# Patient Record
Sex: Female | Born: 1935 | Race: White | Hispanic: No | Marital: Married | Smoking: Never smoker
Health system: Southern US, Community
[De-identification: ages and names within clinical notes are randomized; demographics above are authoritative.]

## PROBLEM LIST (undated history)

## (undated) DIAGNOSIS — K5792 Diverticulitis of intestine, part unspecified, without perforation or abscess without bleeding: Secondary | ICD-10-CM

## (undated) DIAGNOSIS — R252 Cramp and spasm: Secondary | ICD-10-CM

## (undated) DIAGNOSIS — M1990 Unspecified osteoarthritis, unspecified site: Secondary | ICD-10-CM

## (undated) DIAGNOSIS — I6509 Occlusion and stenosis of unspecified vertebral artery: Secondary | ICD-10-CM

## (undated) DIAGNOSIS — E785 Hyperlipidemia, unspecified: Secondary | ICD-10-CM

## (undated) DIAGNOSIS — I10 Essential (primary) hypertension: Secondary | ICD-10-CM

## (undated) DIAGNOSIS — E119 Type 2 diabetes mellitus without complications: Secondary | ICD-10-CM

## (undated) DIAGNOSIS — G4730 Sleep apnea, unspecified: Secondary | ICD-10-CM

## (undated) DIAGNOSIS — K219 Gastro-esophageal reflux disease without esophagitis: Secondary | ICD-10-CM

## (undated) DIAGNOSIS — N3941 Urge incontinence: Secondary | ICD-10-CM

## (undated) HISTORY — DX: Hyperlipidemia, unspecified: E78.5

## (undated) HISTORY — PX: TONSILLECTOMY AND ADENOIDECTOMY: SUR1326

## (undated) HISTORY — PX: TOTAL ABDOMINAL HYSTERECTOMY W/ BILATERAL SALPINGOOPHORECTOMY: SHX83

## (undated) HISTORY — PX: APPENDECTOMY: SHX54

## (undated) HISTORY — PX: CATARACT EXTRACTION W/ INTRAOCULAR LENS  IMPLANT, BILATERAL: SHX1307

## (undated) HISTORY — DX: Cramp and spasm: R25.2

## (undated) HISTORY — DX: Essential (primary) hypertension: I10

---

## 1997-10-15 HISTORY — PX: OTHER SURGICAL HISTORY: SHX169

## 1997-11-26 ENCOUNTER — Ambulatory Visit (HOSPITAL_COMMUNITY): Admission: RE | Admit: 1997-11-26 | Discharge: 1997-11-26 | Payer: Self-pay | Admitting: Internal Medicine

## 1998-12-20 ENCOUNTER — Encounter: Payer: Self-pay | Admitting: Internal Medicine

## 1998-12-20 ENCOUNTER — Ambulatory Visit (HOSPITAL_COMMUNITY): Admission: RE | Admit: 1998-12-20 | Discharge: 1998-12-20 | Payer: Self-pay | Admitting: Internal Medicine

## 1999-02-07 ENCOUNTER — Ambulatory Visit (HOSPITAL_BASED_OUTPATIENT_CLINIC_OR_DEPARTMENT_OTHER): Admission: RE | Admit: 1999-02-07 | Discharge: 1999-02-07 | Payer: Self-pay | Admitting: Orthopedic Surgery

## 2000-01-16 ENCOUNTER — Ambulatory Visit (HOSPITAL_COMMUNITY): Admission: RE | Admit: 2000-01-16 | Discharge: 2000-01-16 | Payer: Self-pay | Admitting: Internal Medicine

## 2000-01-16 ENCOUNTER — Encounter: Payer: Self-pay | Admitting: Internal Medicine

## 2000-01-17 ENCOUNTER — Encounter: Admission: RE | Admit: 2000-01-17 | Discharge: 2000-01-17 | Payer: Self-pay | Admitting: Internal Medicine

## 2000-01-17 ENCOUNTER — Encounter: Payer: Self-pay | Admitting: Internal Medicine

## 2000-04-23 ENCOUNTER — Encounter: Payer: Self-pay | Admitting: Internal Medicine

## 2000-04-23 ENCOUNTER — Encounter: Admission: RE | Admit: 2000-04-23 | Discharge: 2000-04-23 | Payer: Self-pay | Admitting: Internal Medicine

## 2000-06-14 ENCOUNTER — Encounter: Admission: RE | Admit: 2000-06-14 | Discharge: 2000-06-14 | Payer: Self-pay | Admitting: *Deleted

## 2000-06-14 ENCOUNTER — Encounter: Payer: Self-pay | Admitting: Internal Medicine

## 2001-02-13 ENCOUNTER — Ambulatory Visit (HOSPITAL_COMMUNITY): Admission: RE | Admit: 2001-02-13 | Discharge: 2001-02-13 | Payer: Self-pay | Admitting: Otolaryngology

## 2001-02-13 ENCOUNTER — Encounter: Payer: Self-pay | Admitting: Otolaryngology

## 2001-03-03 ENCOUNTER — Ambulatory Visit (HOSPITAL_COMMUNITY): Admission: RE | Admit: 2001-03-03 | Discharge: 2001-03-03 | Payer: Self-pay | Admitting: Internal Medicine

## 2001-03-03 ENCOUNTER — Encounter: Payer: Self-pay | Admitting: Internal Medicine

## 2001-03-18 ENCOUNTER — Ambulatory Visit (HOSPITAL_COMMUNITY): Admission: RE | Admit: 2001-03-18 | Discharge: 2001-03-18 | Payer: Self-pay | Admitting: Internal Medicine

## 2001-03-18 ENCOUNTER — Encounter: Payer: Self-pay | Admitting: Internal Medicine

## 2001-04-01 ENCOUNTER — Ambulatory Visit (HOSPITAL_COMMUNITY): Admission: RE | Admit: 2001-04-01 | Discharge: 2001-04-01 | Payer: Self-pay | Admitting: Interventional Radiology

## 2001-07-28 ENCOUNTER — Encounter: Admission: RE | Admit: 2001-07-28 | Discharge: 2001-07-28 | Payer: Self-pay | Admitting: Internal Medicine

## 2001-07-28 ENCOUNTER — Encounter: Payer: Self-pay | Admitting: Internal Medicine

## 2001-08-15 ENCOUNTER — Ambulatory Visit (HOSPITAL_COMMUNITY): Admission: RE | Admit: 2001-08-15 | Discharge: 2001-08-15 | Payer: Self-pay | Admitting: Family Medicine

## 2001-10-01 ENCOUNTER — Encounter: Payer: Self-pay | Admitting: Internal Medicine

## 2001-10-01 ENCOUNTER — Ambulatory Visit (HOSPITAL_COMMUNITY): Admission: RE | Admit: 2001-10-01 | Discharge: 2001-10-01 | Payer: Self-pay | Admitting: Internal Medicine

## 2002-03-25 ENCOUNTER — Ambulatory Visit (HOSPITAL_COMMUNITY): Admission: RE | Admit: 2002-03-25 | Discharge: 2002-03-25 | Payer: Self-pay | Admitting: Internal Medicine

## 2002-03-25 ENCOUNTER — Encounter: Payer: Self-pay | Admitting: Internal Medicine

## 2002-10-26 ENCOUNTER — Ambulatory Visit (HOSPITAL_COMMUNITY): Admission: RE | Admit: 2002-10-26 | Discharge: 2002-10-26 | Payer: Self-pay | Admitting: Interventional Radiology

## 2003-01-28 ENCOUNTER — Ambulatory Visit (HOSPITAL_COMMUNITY): Admission: RE | Admit: 2003-01-28 | Discharge: 2003-01-28 | Payer: Self-pay | Admitting: Gastroenterology

## 2003-04-23 ENCOUNTER — Encounter: Payer: Self-pay | Admitting: Internal Medicine

## 2003-04-23 ENCOUNTER — Ambulatory Visit (HOSPITAL_COMMUNITY): Admission: RE | Admit: 2003-04-23 | Discharge: 2003-04-23 | Payer: Self-pay | Admitting: Internal Medicine

## 2004-01-11 ENCOUNTER — Ambulatory Visit (HOSPITAL_COMMUNITY): Admission: RE | Admit: 2004-01-11 | Discharge: 2004-01-11 | Payer: Self-pay | Admitting: Interventional Radiology

## 2004-06-12 ENCOUNTER — Ambulatory Visit (HOSPITAL_COMMUNITY): Admission: RE | Admit: 2004-06-12 | Discharge: 2004-06-12 | Payer: Self-pay | Admitting: Internal Medicine

## 2005-03-01 ENCOUNTER — Ambulatory Visit (HOSPITAL_COMMUNITY): Admission: RE | Admit: 2005-03-01 | Discharge: 2005-03-01 | Payer: Self-pay | Admitting: Interventional Radiology

## 2005-07-09 ENCOUNTER — Ambulatory Visit (HOSPITAL_COMMUNITY): Admission: RE | Admit: 2005-07-09 | Discharge: 2005-07-09 | Payer: Self-pay | Admitting: Internal Medicine

## 2006-05-21 ENCOUNTER — Encounter: Admission: RE | Admit: 2006-05-21 | Discharge: 2006-05-21 | Payer: Self-pay | Admitting: Internal Medicine

## 2006-07-09 ENCOUNTER — Encounter: Admission: RE | Admit: 2006-07-09 | Discharge: 2006-07-09 | Payer: Self-pay | Admitting: Internal Medicine

## 2006-10-01 ENCOUNTER — Encounter: Admission: RE | Admit: 2006-10-01 | Discharge: 2006-10-01 | Payer: Self-pay | Admitting: Internal Medicine

## 2007-06-18 ENCOUNTER — Ambulatory Visit: Payer: Self-pay | Admitting: Vascular Surgery

## 2007-07-17 ENCOUNTER — Ambulatory Visit: Payer: Self-pay | Admitting: Vascular Surgery

## 2007-08-26 ENCOUNTER — Ambulatory Visit (HOSPITAL_BASED_OUTPATIENT_CLINIC_OR_DEPARTMENT_OTHER): Admission: RE | Admit: 2007-08-26 | Discharge: 2007-08-26 | Payer: Self-pay | Admitting: Urology

## 2007-08-26 HISTORY — PX: PUBOVAGINAL SLING: SHX1035

## 2007-09-19 ENCOUNTER — Encounter: Admission: RE | Admit: 2007-09-19 | Discharge: 2007-09-19 | Payer: Self-pay | Admitting: Internal Medicine

## 2007-11-13 ENCOUNTER — Ambulatory Visit: Payer: Self-pay | Admitting: Vascular Surgery

## 2008-09-10 ENCOUNTER — Emergency Department (HOSPITAL_COMMUNITY): Admission: EM | Admit: 2008-09-10 | Discharge: 2008-09-11 | Payer: Self-pay | Admitting: Emergency Medicine

## 2008-10-19 ENCOUNTER — Encounter: Admission: RE | Admit: 2008-10-19 | Discharge: 2008-10-19 | Payer: Self-pay | Admitting: Internal Medicine

## 2008-12-16 ENCOUNTER — Inpatient Hospital Stay (HOSPITAL_COMMUNITY): Admission: RE | Admit: 2008-12-16 | Discharge: 2008-12-19 | Payer: Self-pay | Admitting: Orthopaedic Surgery

## 2008-12-16 HISTORY — PX: TOTAL HIP ARTHROPLASTY: SHX124

## 2009-10-25 ENCOUNTER — Encounter: Admission: RE | Admit: 2009-10-25 | Discharge: 2009-10-25 | Payer: Self-pay | Admitting: Internal Medicine

## 2009-11-29 ENCOUNTER — Encounter: Admission: RE | Admit: 2009-11-29 | Discharge: 2009-11-29 | Payer: Self-pay | Admitting: Internal Medicine

## 2010-03-31 ENCOUNTER — Encounter: Admission: RE | Admit: 2010-03-31 | Discharge: 2010-03-31 | Payer: Self-pay | Admitting: Otolaryngology

## 2010-11-06 ENCOUNTER — Other Ambulatory Visit: Payer: Self-pay | Admitting: Internal Medicine

## 2010-11-06 DIAGNOSIS — Z1239 Encounter for other screening for malignant neoplasm of breast: Secondary | ICD-10-CM

## 2010-12-06 ENCOUNTER — Ambulatory Visit
Admission: RE | Admit: 2010-12-06 | Discharge: 2010-12-06 | Disposition: A | Discharge status: Home / Self Care | Payer: Medicare Other | Source: Ambulatory Visit | Attending: Internal Medicine | Admitting: Internal Medicine

## 2010-12-06 DIAGNOSIS — Z1239 Encounter for other screening for malignant neoplasm of breast: Secondary | ICD-10-CM

## 2011-01-25 LAB — PROTIME-INR
INR: 1.1 (ref 0.00–1.49)
INR: 1.4 (ref 0.00–1.49)
INR: 2.3 — ABNORMAL HIGH (ref 0.00–1.49)
Prothrombin Time: 13.2 seconds (ref 11.6–15.2)

## 2011-01-25 LAB — BASIC METABOLIC PANEL
BUN: 10 mg/dL (ref 6–23)
CO2: 29 mEq/L (ref 19–32)
CO2: 29 mEq/L (ref 19–32)
Calcium: 8.1 mg/dL — ABNORMAL LOW (ref 8.4–10.5)
Calcium: 8.4 mg/dL (ref 8.4–10.5)
Calcium: 8.4 mg/dL (ref 8.4–10.5)
Chloride: 98 mEq/L (ref 96–112)
GFR calc Af Amer: >60 mL/min (ref >60)
GFR calc Af Amer: >60 mL/min (ref >60)
GFR calc non Af Amer: >60 mL/min (ref >60)
GFR calc non Af Amer: >60 mL/min (ref >60)
Glucose, Bld: 153 mg/dL — ABNORMAL HIGH (ref 70–99)
Glucose, Bld: 155 mg/dL — ABNORMAL HIGH (ref 70–99)
Glucose, Bld: 171 mg/dL — ABNORMAL HIGH (ref 70–99)
Potassium: 3 mEq/L — ABNORMAL LOW (ref 3.5–5.1)
Potassium: 3.3 mEq/L — ABNORMAL LOW (ref 3.5–5.1)
Sodium: 135 mEq/L (ref 135–145)
Sodium: 135 mEq/L (ref 135–145)

## 2011-01-25 LAB — GLUCOSE, CAPILLARY
Glucose-Capillary: 109 mg/dL — ABNORMAL HIGH (ref 70–99)
Glucose-Capillary: 130 mg/dL — ABNORMAL HIGH (ref 70–99)
Glucose-Capillary: 132 mg/dL — ABNORMAL HIGH (ref 70–99)
Glucose-Capillary: 157 mg/dL — ABNORMAL HIGH (ref 70–99)
Glucose-Capillary: 160 mg/dL — ABNORMAL HIGH (ref 70–99)
Glucose-Capillary: 177 mg/dL — ABNORMAL HIGH (ref 70–99)
Glucose-Capillary: 185 mg/dL — ABNORMAL HIGH (ref 70–99)
Glucose-Capillary: 185 mg/dL — ABNORMAL HIGH (ref 70–99)
Glucose-Capillary: 208 mg/dL — ABNORMAL HIGH (ref 70–99)

## 2011-01-25 LAB — CBC
HCT: 32.7 % — ABNORMAL LOW (ref 36.0–46.0)
HCT: 33.4 % — ABNORMAL LOW (ref 36.0–46.0)
HCT: 34.3 % — ABNORMAL LOW (ref 36.0–46.0)
Hemoglobin: 11.6 g/dL — ABNORMAL LOW (ref 12.0–15.0)
Hemoglobin: 11.9 g/dL — ABNORMAL LOW (ref 12.0–15.0)
Hemoglobin: 12 g/dL (ref 12.0–15.0)
MCHC: 35.5 g/dL (ref 30.0–36.0)
MCHC: 35.6 g/dL (ref 30.0–36.0)
Platelets: 142 10*3/uL — ABNORMAL LOW (ref 150–400)
RBC: 3.62 MIL/uL — ABNORMAL LOW (ref 3.87–5.11)
RBC: 3.72 MIL/uL — ABNORMAL LOW (ref 3.87–5.11)
RDW: 12.2 % (ref 11.5–15.5)
RDW: 12.3 % (ref 11.5–15.5)

## 2011-01-30 LAB — BASIC METABOLIC PANEL
Calcium: 9.2 mg/dL (ref 8.4–10.5)
Chloride: 103 mEq/L (ref 96–112)
Creatinine, Ser: 0.82 mg/dL (ref 0.4–1.2)
GFR calc Af Amer: >60 mL/min (ref >60)
Sodium: 139 mEq/L (ref 135–145)

## 2011-01-30 LAB — CBC
Hemoglobin: 15 g/dL (ref 12.0–15.0)
MCV: 92.4 fL (ref 78.0–100.0)
RBC: 4.67 MIL/uL (ref 3.87–5.11)
WBC: 6.3 10*3/uL (ref 4.0–10.5)

## 2011-02-27 NOTE — Op Note (Signed)
NAMEREATHER, STELLER              ACCOUNT NO.:  [id number]   MEDICAL RECORD NO.:  [id number]          PATIENT TYPE:  INP   LOCATION:  5022                         FACILITY:  [facility]   PHYSICIAN:  Lubertha Basque. Dalldorf, M.D.DATE OF BIRTH:  January 01, 1936   DATE OF PROCEDURE:  12/16/2008  DATE OF DISCHARGE:                               OPERATIVE REPORT   PREOPERATIVE DIAGNOSIS:  Right hip degenerative arthritis.   POSTOPERATIVE DIAGNOSIS:  Right hip degenerative arthritis.   PROCEDURE:  Right total hip replacement.   ANESTHESIA:  General.   ATTENDING SURGEON:  Lubertha Basque. Jerl Santos, MD   ASSISTANT:  Lindwood Qua, PA   INDICATIONS FOR PROCEDURE:  The patient is a 75 year old woman with a  long history of right hip pain.  This has persisted despite oral anti-  inflammatories, activity restriction, and even intra-articular injection  which did help for several months.  By x-ray, she has advanced  degenerative change.  She has pain which limits her ability to remain  active and to rest and she is offered hip replacement.  Informed  operative consent was obtained after discussion of possible  complications including reaction to anesthesia, infection, DVT, PE,  dislocation, and death.   SUMMARY/FINDINGS/PROCEDURE:  Under general anesthesia through a standard  posterior approach, a right total hip replacement was performed.  She  did have advanced degenerative change, but excellent bone quality.  We  addressed her problem with an uncemented DePuy system using a size 4  standard Summit stem capped with a 36 +1.5 hip ball.  At the acetabulum,  we used a size 50 pinnacle cup filled with a 36 x 50 metal liner.  I did  use the hole eliminator.  Bryna Colander assisted throughout and was  invaluable to the completion of the case and that he helped position and  retract while I performed the procedure.  He also closed simultaneously  to help minimize OR time.   DESCRIPTION OF PROCEDURE:  The  patient was brought to the operating  suite where general anesthetic was applied without difficulty.  She was  positioned in the lateral decubitus position with the right hip up.  Bony prominences were all padded, hip positioners were utilized, and an  axillary roll was placed.  She was then prepped and draped in normal  sterile fashion.  After the administration of IV Kefzol, posterior  approach was taken to the right hip.  All appropriate anti-infected  measures were used including closed hooded exhaust systems for each  member of the surgical team, Betadine impregnated drape, and the  preoperative IV antibiotic.  Dissection was carried down through a  paucity of adipose tissue to the IT band and gluteus maximus fascia,  which were incised longitudinally.  A Charnley retractor was placed.  The short external rotators were tagged and reflected and a posterior  capsulectomy was performed.  The hip was dislocated.  The femoral neck  cut was made above the lesser trochanter.  The acetabulum was exposed  and labral structures were excised.  Reaming was taken towards the  medial wall of the pelvis followed by sequential  up reaming to 49.  We  then placed a size 50 pinnacle cup in appropriate anteversion and tilt.  I placed a trial liner.  Attention was turned toward the femur.  This  was reamed to 4 and broached up to 4, but seemed to fit very well and  tight.  This was capped with a series of trial neck lengths and the  standard +1.5 seemed to give [place] good stability in extension with  external rotation and flexion with internal rotation and seemed to  equalize the leg lengths well.  The trial components were removed  followed by placement of first the size 36 x 50 metal liner in the  acetabular cup.  We then placed a size 4 standard Summit stem in  appropriate anteversion and then capped it with the 36 +1.5 hip ball.  The hip was reduced into position and again was stable.  The wound was   irrigated followed by reapproximation of the short external rotators to  the greater trochanteric region with nonabsorbable suture.  IT band and  gluteus maximus fascia reapproximated with nonabsorbable suture followed  by subcutaneous reapproximation with 0 and 2-0 undyed Vicryl and skin  closure with staples.  A Mepilex dressing was applied.  Estimated blood  loss was 100 mL and intraoperative fluid was obtained from anesthesia  records.   DISPOSITION:  The patient was extubated in the operating room and taken  to recovery room in stable addition.  She was to be admitted to the  Orthopedic Surgery Service for appropriate postop care to include  perioperative antibiotics and Coumadin plus Lovenox for DVT prophylaxis.      Lubertha Basque Jerl Santos, M.D.  Electronically Signed     PGD/MEDQ  D:  12/16/2008  T:  12/16/2008  Job:  [id number]

## 2011-02-27 NOTE — Consult Note (Signed)
NEW PATIENT CONSULTATION   Savannah Harris, Savannah Harris A  DOB:  April 18, 1936                                       06/18/2007  VHQIO#:[id number]   Savannah Harris presents today for evaluation of her lower extremity  pain.  She is an active, healthy, 75 year old white female who reports  bilateral leg aching and discomfort.  This has been progressive over  time and has become quite limiting to her.  She reports that this is  equal in her right and left leg.  She notes that this is most severe  when she stands from a prolonged sitting position and reports that she  takes a few minutes to allow her legs to become functional for her.  She  fears that she may fall, although she has not fallen related to this.  She does have some mild swelling in both lower extremities.  She does  not have any history of deep venous thrombosis, superficial  thrombophlebitis, or bleeding.   PAST MEDICAL HISTORY:  Significant for elevated cholesterol.  She does  have low back strain.   PAST SURGICAL HISTORY:  Notable for left wrist pinning after a fracture  in 1995 and 1998.  She had a remote history of hysterectomy and this was  in 1979.  She elevates her legs when possible.   MEDICATIONS:  She does take Celebrex for arthritic hand pain.   SOCIAL HISTORY:  She is married with two children.  She is retired.  She  does not smoke or drink alcohol on a regular basis.   ALLERGIES:  CODEINE.   PHYSICAL EXAMINATION:  GENERAL:  A well-developed, well-nourished white  female appearing stated age of 78.  VITAL SIGNS:  Blood pressure 134/75, Pulse 89, respirations 16.  Her  dorsalis pedis pulses are 2+ bilaterally.  She does not have any  swelling and does not have any skin changes suggesting chronic venous  hypertension.  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  She does not have  any reticular or varicose veins.   She underwent  a handheld Duplex by me and this revealed normal size  saphenous vein with no evidence of reflux on a screening study.   I had a long discussion with Ms. Sistare.  I do not feel that her leg  discomfort is related to arterial or venous pathology.  I questioned as  to whether there may be some rheumatologic factor causing her discomfort  due to the history of it occurring when she first arises.  She is  concerning for cosmetic purposes regarding the telangiectasia and I have  discussed with her the treatment option of sclerotherapy and I have also  discussed estimated out of pocket cost to her.  She wishes to proceed  with this and will schedule this with Ms. Sherene Sires, RN for sclerotherapy.   Larina Earthly, M.D.  Electronically Signed   TFE/MEDQ  D:  06/18/2007  T:  06/19/2007  Job:  [id number]   cc:   Theressa Millard, M.D.

## 2011-02-27 NOTE — Op Note (Signed)
Savannah Harris, Savannah Harris              ACCOUNT NO.:  [id number]   MEDICAL RECORD NO.:  [id number]          PATIENT TYPE:  AMB   LOCATION:  [facility]                         FACILITY[facility]  [facility]   PHYSICIAN:  Martina Sinner, MD DATE OF BIRTH:  [birth year]   DATE OF PROCEDURE:  08/26/2007  DATE OF DISCHARGE:                               OPERATIVE REPORT   PREOPERATIVE DIAGNOSIS:  Diagnosis mixed stress and urge incontinence.   POSTOPERATIVE DIAGNOSIS:   SURGERY:  1. Sling cystourethropexy [facility]).  2. Cystoscopy.   [name] has mixed stress and urge incontinence.  She has had TIAs but  did not have obvious evidence of a neurogenic bladder.  She has failed  medical therapy.   The patient was prepped and draped in the usual fashion.  Extra care was  taken in positioning her legs to minimize the risk of compartment  syndrome, neuropathy and DVT.   Two 1-cm incisions were made 1.5 cm lateral to the midline, one  fingerbreadth above the symphysis pubis.  She did have a short urethra.  An approximately 2-cm incision was made over the mid urethra.  Lidocaine-  epinephrine mixture 8 mL was utilized for hemostasis and to develop a  thick pubocervical flap.  The incision was made to develop a flap.  I  dissected to the urethrovesical angle bilateral with Strully scissors.  I could palpate the urethrovesical angle bilaterally.   With the bladder emptied I passed the [facility] needle on top of and along  the back of the symphysis pubis parallel to the midline onto the pulp of  my index finger bilaterally.  There was no dimpling of the vaginal  mucosa.  With the bladder empty, the sling was attached and brought up  with its sheath through the retropubic space.  I tensioned the sling  over the mid urethra using the fat part of a [employer].  I cut  below the blue dots, irrigated the sheath and removed the sheath.  I  could hypermobilize the sling in the midline and laterally.  There was  no  question it was in the position of the mid urethra.  I was happy with  this position and tension.  Irrigation was used for all incisions.  I  closed the anterior vaginal wall with running 2-0 Vicryl on a CT1  needle, followed by one interrupted suture.  I cut the sling below the  skin and closed with 4-0 Vicryl and Dermabond.   After passing the trocar needles, I cystoscoped the patient and there  was no indentation of the bladder injury to the bladder or urethra.  There was effluxing indigo carmine from both ureteral orifices.   I was very pleased with [name]'s surgery.  Hopefully, I reached her  treatment goal.           ______________________________  Martina Sinner, MD  Electronically Signed     SAM/MEDQ  D:  08/26/2007  T:  08/26/2007  Job:  [id number]

## 2011-03-02 NOTE — Discharge Summary (Signed)
[name], [name]              ACCOUNT NO.:  [id number]   MEDICAL RECORD NO.:  [id number]          PATIENT TYPE:  INP   LOCATION:  5022                         FACILITY:  [facility]   PHYSICIAN:  Lubertha Basque. Dalldorf, M.D.DATE OF BIRTH:  03/09/1936   DATE OF ADMISSION:  12/16/2008  DATE OF DISCHARGE:  12/19/2008                               DISCHARGE SUMMARY   ADMITTING DIAGNOSES:  1. Right hip degenerative joint disease, end-stage.  2. Hypertension.  3. Non-insulin-dependent diabetes mellitus.   DISCHARGE DIAGNOSES:  1. Right hip degenerative joint disease, end-stage.  2. Hypertension.  3. Non-insulin-dependent diabetes mellitus.   OPERATION:  Right hip replacement.   BRIEF HISTORY:  [name] is a 75 year old female patient well-known to  our practice who is having increasing right hip pain, trouble walking,  and trouble sleeping at night time.  X-rays reveal end-stage DJD on an  AP and lateral film.   PERTINENT LABORATORY DATA AND X-RAY FINDINGS:  EKG:  Sinus rhythm.  WBCs  9.6, hemoglobin 11.9, hematocrit 33.4, and platelets 173.  Last INR 2.3,  as she was on low-dose Coumadin protocol per Pharmacy.  Sodium 135,  potassium 3.3, glucose 155, BUN 13, and creatinine 0.73.   COURSE IN THE HOSPITAL:  She was kept on her home medicines which will  be outlined at the end of this dictation.  She was on a low-dose reduced  dose PCA morphine pump and IV lactated Ringer's, Coumadin, and Lovenox  protocol per DVT prophylaxis per Pharmacy.  IV Ancef 1 gram q.8 h. x3  doses and then other additional medicines included stool softeners, iron  pills, oral antiemetics, and pain medicine.  She had knee-high TEDs,  incentive spirometry, and then mobilization with Physical Therapy to be  touchdown weightbearing.  The first day postop, her lungs were clear,  abdomen was soft, and vital signs were stable.  Hemoglobin 11.6 and  glucose 153.  Foley catheter was then later discontinued that day.   She  had her dressing change the second day postop.  Her wound on right hip  was benign.  No sign of infection.  She continued to have lungs clear.  Abdomen was soft.  She was eating and voiding, and then worked well with  Physical Therapy and was discharged home.   CONDITION ON DISCHARGE:  Improved.   FOLLOWUP:  She will be on a low-sodium heart-healthy diet, be touchdown  weightbearing as tolerated.  Home Agency, I believe [facility]  will be out for physical therapy and INRs for Coumadin for 4 weeks.  She  was given a prescription for Coumadin one regulated by Pharmacy,  initially 2 mg at 6:00 p.m. daily and Vicodin 1-2 every 4-6 for pain.  Return to see Dr. Jerl Santos in 2 weeks, [phone].   MEDICATIONS:  She will remain on her home medicines which were:  1. Simvastatin 10 mg one a day.  2. Premarin 0.9 daily.  3. Lisinopril and hydrochlorothiazide one daily.  4. Vitamin D.  5. Multivitamin.  6. Stool softeners.      Lindwood Qua, P.A.  Lubertha Basque Jerl Santos, M.D.  Electronically Signed    MC/MEDQ  D:  01/25/2009  T:  01/26/2009  Job:  [id number]

## 2011-03-19 ENCOUNTER — Other Ambulatory Visit: Payer: Self-pay | Admitting: Internal Medicine

## 2011-03-19 DIAGNOSIS — R1032 Left lower quadrant pain: Secondary | ICD-10-CM

## 2011-03-21 ENCOUNTER — Ambulatory Visit
Admission: RE | Admit: 2011-03-21 | Discharge: 2011-03-21 | Disposition: A | Discharge status: Home / Self Care | Payer: Medicare Other | Source: Ambulatory Visit | Attending: Internal Medicine | Admitting: Internal Medicine

## 2011-03-21 DIAGNOSIS — R1032 Left lower quadrant pain: Secondary | ICD-10-CM

## 2011-03-21 MED ORDER — IOHEXOL 300 MG/ML  SOLN: 100.0000 mL | Freq: Once | INTRAMUSCULAR | Status: AC | PRN

## 2011-07-17 LAB — URINALYSIS, ROUTINE W REFLEX MICROSCOPIC
Glucose, UA: NEGATIVE
Hgb urine dipstick: NEGATIVE
Ketones, ur: 15 — AB
Protein, ur: NEGATIVE

## 2011-07-17 LAB — CBC
MCHC: 33.9
MCV: 91.7
RBC: 5.13 — ABNORMAL HIGH
RDW: 11.9

## 2011-07-17 LAB — BASIC METABOLIC PANEL
Calcium: 9.9
GFR calc Af Amer: >60
GFR calc non Af Amer: >60
Potassium: 3.3 — ABNORMAL LOW
Sodium: 140

## 2011-07-17 LAB — GLUCOSE, CAPILLARY: Glucose-Capillary: 137 — ABNORMAL HIGH

## 2011-07-24 LAB — CBC
MCHC: 35.7
MCV: 88
Platelets: 171

## 2011-07-24 LAB — PROTIME-INR: Prothrombin Time: 12.8

## 2011-07-24 LAB — BASIC METABOLIC PANEL
BUN: 20
CO2: 29
Chloride: 105
Creatinine, Ser: 0.94

## 2011-10-30 ENCOUNTER — Other Ambulatory Visit: Payer: Self-pay | Admitting: Internal Medicine

## 2011-10-30 DIAGNOSIS — Z1231 Encounter for screening mammogram for malignant neoplasm of breast: Secondary | ICD-10-CM

## 2011-12-10 ENCOUNTER — Ambulatory Visit
Admission: RE | Admit: 2011-12-10 | Discharge: 2011-12-10 | Disposition: A | Discharge status: Home / Self Care | Payer: Medicare Other | Source: Ambulatory Visit | Attending: Internal Medicine | Admitting: Internal Medicine

## 2011-12-10 DIAGNOSIS — Z1231 Encounter for screening mammogram for malignant neoplasm of breast: Secondary | ICD-10-CM

## 2012-12-24 ENCOUNTER — Other Ambulatory Visit: Payer: Self-pay | Admitting: Specialist

## 2012-12-24 DIAGNOSIS — R519 Headache, unspecified: Secondary | ICD-10-CM

## 2012-12-24 DIAGNOSIS — I729 Aneurysm of unspecified site: Secondary | ICD-10-CM

## 2012-12-31 ENCOUNTER — Ambulatory Visit
Admission: RE | Admit: 2012-12-31 | Discharge: 2012-12-31 | Disposition: A | Discharge status: Home / Self Care | Payer: Medicare Other | Source: Ambulatory Visit | Attending: Specialist | Admitting: Specialist

## 2012-12-31 DIAGNOSIS — I729 Aneurysm of unspecified site: Secondary | ICD-10-CM

## 2012-12-31 MED ORDER — GADOBENATE DIMEGLUMINE 529 MG/ML IV SOLN
13.0000 mL | Freq: Once | INTRAVENOUS | Status: AC | PRN
  Administered 2012-12-31: 13 mL via INTRAVENOUS

## 2013-02-03 ENCOUNTER — Other Ambulatory Visit: Payer: Self-pay

## 2013-02-03 DIAGNOSIS — Z1231 Encounter for screening mammogram for malignant neoplasm of breast: Secondary | ICD-10-CM

## 2013-02-26 ENCOUNTER — Other Ambulatory Visit: Payer: Self-pay | Admitting: Nurse Practitioner

## 2013-02-26 DIAGNOSIS — K469 Unspecified abdominal hernia without obstruction or gangrene: Secondary | ICD-10-CM

## 2013-03-02 ENCOUNTER — Ambulatory Visit
Admission: RE | Admit: 2013-03-02 | Discharge: 2013-03-02 | Disposition: A | Discharge status: Home / Self Care | Payer: Medicare Other | Source: Ambulatory Visit | Attending: Nurse Practitioner | Admitting: Nurse Practitioner

## 2013-03-02 ENCOUNTER — Encounter (INDEPENDENT_AMBULATORY_CARE_PROVIDER_SITE_OTHER): Payer: Self-pay | Admitting: Surgery

## 2013-03-02 ENCOUNTER — Ambulatory Visit (INDEPENDENT_AMBULATORY_CARE_PROVIDER_SITE_OTHER): Payer: Medicare Other | Admitting: Surgery

## 2013-03-02 VITALS — BP 122/60 | HR 60 | Resp 18 | Ht 62.0 in | Wt 144.0 lb

## 2013-03-02 DIAGNOSIS — M6200 Separation of muscle (nontraumatic), unspecified site: Secondary | ICD-10-CM

## 2013-03-02 DIAGNOSIS — K5900 Constipation, unspecified: Secondary | ICD-10-CM

## 2013-03-02 DIAGNOSIS — M6208 Separation of muscle (nontraumatic), other site: Secondary | ICD-10-CM

## 2013-03-02 DIAGNOSIS — K469 Unspecified abdominal hernia without obstruction or gangrene: Secondary | ICD-10-CM

## 2013-03-02 DIAGNOSIS — K5909 Other constipation: Secondary | ICD-10-CM | POA: Insufficient documentation

## 2013-03-02 NOTE — Progress Notes (Signed)
Subjective:     Patient ID: Savannah Harris, female   DOB: 02-27-1936, 77 y.o.   MRN: [id number]  HPI  Savannah Harris  01-Jan-1936 [id number]  Patient Care Team: Darnelle Bos, MD as PCP - General (Internal Medicine) Virl Cagey as Nurse Practitioner (Nurse Practitioner)  This patient is a 77 y.o.female who presents today for surgical evaluation at the request of Savannah Harris.   Reason for visit: Bulging of the abdomen.  Concern for hernia.  Pleasant active female.  She comes today with her husband.  She noticed a bulge in her abdomen last week.  Is not painful.  It concerned her.  She discusses with her primary care office.  Ultrasound ordered.  No other intra-abdominal problems.  Mild bulging.  Patient comes to me for evaluation per request of Savannah Harris.  The patient has constipation with a bowel movement every 2-3 days.  Uses laxatives.  Was recommended to start MiraLAX.  She is part of a [employer] walking group.  Walks an hour at night most nights.  Has some mild low back pain and knee soreness with zoster down but otherwise rather active.  Had a hysterectomy but no other abdominal surgeries.  No problem eating.  No nausea or vomiting.  No sick contacts or diarrhea  No personal nor family history of GI/colon cancer, inflammatory bowel disease, irritable bowel syndrome, allergy such as Celiac Sprue, dietary/dairy problems, colitis, ulcers nor gastritis.  No recent sick contacts/gastroenteritis.  No travel outside the country.  No changes in diet.    Patient Active Problem List   Diagnosis Date Noted  . Diastasis recti 03/02/2013  . Constipation, chronic 03/02/2013    Past Medical History  Diagnosis Date  . Diabetes   . Hypertension   . Hyperlipidemia   . Migraines   . Leg cramps     Past Surgical History  Procedure Laterality Date  . Hip surgery  2009    right hip  . Left hand    . Total abdominal hysterectomy    . Gallbladder surgery      History    Social History  . Marital Status: Married    Spouse Name: N/A    Number of Children: N/A  . Years of Education: N/A   Occupational History  . Not on file.   Social History Main Topics  . Smoking status: Never Smoker   . Smokeless tobacco: Not on file  . Alcohol Use: No  . Drug Use: No  . Sexually Active: Not on file   Other Topics Concern  . Not on file   Social History Narrative  . No narrative on file    Family History  Problem Relation Age of Onset  . Diabetes Mother     Current Outpatient Prescriptions  Medication Sig Dispense Refill  . aspirin 81 MG tablet Take 81 mg by mouth daily.      Marland Kitchen atorvastatin (LIPITOR) 20 MG tablet Take 20 mg by mouth daily.      . Biotin 10 MG TABS Take by mouth.      . cholecalciferol (VITAMIN D) 1000 UNITS tablet Take 1,000 Units by mouth daily.      . fish oil-omega-3 fatty acids 1000 MG capsule Take 2 g by mouth 1 day or 1 dose.      Marland Kitchen lisinopril-hydrochlorothiazide (PRINZIDE,ZESTORETIC) 20-25 MG per tablet Take 1 tablet by mouth daily.      . metFORMIN (GLUCOPHAGE-XR) 500 MG 24 hr tablet Take  1,000 mg by mouth 2 (two) times daily.      . mirabegron ER (MYRBETRIQ) 25 MG TB24 Take 25 mg by mouth daily.      Marland Kitchen omeprazole (PRILOSEC) 20 MG capsule Take 20 mg by mouth daily.      . vitamin B-12 (CYANOCOBALAMIN) 1000 MCG tablet Take 1,000 mcg by mouth daily.       No current facility-administered medications for this visit.     Allergies  Allergen Reactions  . Codeine Nausea And Vomiting    BP 122/60  Pulse 60  Resp 18  Ht 5\' 2"  (1.575 m)  Wt 144 lb (65.318 kg)  BMI 26.33 kg/m2  US Abdomen Complete  03/02/2013   *RADIOLOGY REPORT*  Clinical Data:  Abdominal hernia  COMPLETE ABDOMINAL ULTRASOUND  Comparison:  CT scan 03/12/2012  Findings:  Gallbladder:  No gallstones, gallbladder wall thickening, or pericholecystic fluid. No sonographic Murphy's sign.  Common bile duct:  Measures 4.2 mm in diameter within normal limits.   Liver:  No focal lesion identified. Mild increased echogenicity of the liver suspicious for fatty infiltration.  IVC:  Appears normal.  Pancreas:  Not seen due to bowel gas.  Spleen:  Measures 5 cm in length.  Normal echogenicity.  Right Kidney:  Measures 9.3 cm in length.  No mass, hydronephrosis or diagnostic renal calculus  Left Kidney:  Measures 10.8 cm in length.  No mass, hydronephrosis or diagnostic renal calculus  Abdominal aorta:  No aneurysm identified. Measures up to 2 cm in diameter.  There is mild bulging of midline anterior abdominal wall.  A ventral hernia cannot be excluded.  Clinical correlation is necessary.  Further evaluation with CT scan could be performed.  IMPRESSION:  1.  No gallstones are noted within gallbladder.  Normal CBD. 2.  Probable fatty infiltration of the liver. 3.  No hydronephrosis or diagnostic renal calculus. 4.There is mild bulging of midline anterior abdominal wall.  A ventral hernia cannot be excluded.  Clinical correlation is necessary.  Further evaluation with CT scan could be performed.   Original Report Authenticated By: Natasha Mead, M.D.     Review of Systems  Constitutional: Negative for fever, chills, diaphoresis, appetite change and fatigue.  HENT: Negative for ear pain, sore throat, trouble swallowing, neck pain and ear discharge.   Eyes: Negative for photophobia, discharge and visual disturbance.  Respiratory: Negative for cough, choking, chest tightness and shortness of breath.   Cardiovascular: Negative for chest pain and palpitations.  Gastrointestinal: Negative for nausea, vomiting, abdominal pain, diarrhea, constipation, blood in stool, abdominal distention, anal bleeding and rectal pain.  Endocrine: Negative for cold intolerance and heat intolerance.  Genitourinary: Negative for dysuria, frequency and difficulty urinating.  Musculoskeletal: Positive for back pain and arthralgias. Negative for myalgias and gait problem.  Skin: Negative for color  change, pallor and rash.  Allergic/Immunologic: Negative for environmental allergies, food allergies and immunocompromised state.  Neurological: Negative for dizziness, speech difficulty, weakness and numbness.  Hematological: Negative for adenopathy.  Psychiatric/Behavioral: Negative for confusion and agitation. The patient is not nervous/anxious.        Objective:   Physical Exam  Constitutional: She is oriented to person, place, and time. She appears well-developed and well-nourished. No distress.  HENT:  Head: Normocephalic.  Mouth/Throat: Oropharynx is clear and moist. No oropharyngeal exudate.  Eyes: Conjunctivae and EOM are normal. Pupils are equal, round, and reactive to light. No scleral icterus.  Neck: Normal range of motion. Neck supple. No tracheal deviation present.  Cardiovascular: Normal rate, regular rhythm and intact distal pulses.   Pulmonary/Chest: Effort normal and breath sounds normal. No stridor. No respiratory distress. She exhibits no tenderness.  Abdominal: Soft. She exhibits no distension and no mass. There is no tenderness. There is no rigidity, no rebound, no guarding, no tenderness at McBurney's point and negative Murphy's sign. No hernia. Hernia confirmed negative in the ventral area, confirmed negative in the right inguinal area and confirmed negative in the left inguinal area.    Genitourinary: No vaginal discharge found.  Musculoskeletal: Normal range of motion. She exhibits no tenderness.       Right elbow: She exhibits normal range of motion.       Left elbow: She exhibits normal range of motion.       Right wrist: She exhibits normal range of motion.       Left wrist: She exhibits normal range of motion.       Right hand: Normal strength noted.       Left hand: Normal strength noted.  Lymphadenopathy:       Head (right side): No posterior auricular adenopathy present.       Head (left side): No posterior auricular adenopathy present.    She has no  cervical adenopathy.    She has no axillary adenopathy.       Right: No inguinal adenopathy present.       Left: No inguinal adenopathy present.  Neurological: She is alert and oriented to person, place, and time. No cranial nerve deficit. She exhibits normal muscle tone. Coordination normal.  Skin: Skin is warm and dry. No rash noted. She is not diaphoretic. No erythema.  Psychiatric: She has a normal mood and affect. Her behavior is normal. Judgment and thought content normal.       Assessment:     Mild supraumbilical diastasis recti.  Not symptomatic.  Chronic constipation.  Starting a new bowel regimen with MiraLAX.     Plan:     I noted that diastases recti is a anatomical variation.  It is not a true hernia.  It does not require any surgical intervention.  She feels reassured.  We did discuss if it was a hernia, surgery will repair would be warranted but that is not the case.  Constipation.  I recommended increasing her MiraLAX gradually to have one soft bowel movement a day.  Try to minimize laxatives to a backup plan only.   Low fat high fiber diet ideal.  Bowel regimen with 30 g fiber a day and fiber supplement as needed to avoid problems.  Activity as tolerated to regular activity.  Low impact exercise such as walking an hour a day at least ideal.  Do not push through pain.  Return to clinic as needed.   Instructions discussed.  Followup with primary care physician for other health issues as would normally be done.  Questions answered.  The patient expressed understanding and appreciation

## 2013-03-02 NOTE — Patient Instructions (Addendum)
You have diastasis recti.  The central abdominal linea alba tendon is mildly stretched.  It is not a true hernia.  It is not a life-threatening problem.  It requires no treatment/intervention/surgery  GETTING TO GOOD BOWEL HEALTH. Irregular bowel habits such as constipation and diarrhea can lead to many problems over time.  Having one soft bowel movement a day is the most important way to prevent further problems.  The anorectal canal is designed to handle stretching and feces to safely manage our ability to get rid of solid waste (feces, poop, stool) out of our body.  BUT, hard constipated stools can act like ripping concrete bricks and diarrhea can be a burning fire to this very sensitive area of our body, causing inflamed hemorrhoids, anal fissures, increasing risk is perirectal abscesses, abdominal pain/bloating, an making irritable bowel worse.     The goal: ONE SOFT BOWEL MOVEMENT A DAY!  To have soft, regular bowel movements:    Drink at least 8 tall glasses of water a day.     Take plenty of fiber.  Fiber is the undigested part of plant food that passes into the colon, acting s "natures broom" to encourage bowel motility and movement.  Fiber can absorb and hold large amounts of water. This results in a larger, bulkier stool, which is soft and easier to pass. Work gradually over several weeks up to 6 servings a day of fiber (25g a day even more if needed) in the form of: o Vegetables -- Root (potatoes, carrots, turnips), leafy green (lettuce, salad greens, celery, spinach), or cooked high residue (cabbage, broccoli, etc) o Fruit -- Fresh (unpeeled skin & pulp), Dried (prunes, apricots, cherries, etc ),  or stewed ( applesauce)  o Whole grain breads, pasta, etc (whole wheat)  o Bran cereals    Bulking Agents -- This type of water-retaining fiber generally is easily obtained each day by one of the following:  o Psyllium bran -- The psyllium plant is remarkable because its ground seeds can retain so  much water. This product is available as Metamucil, Konsyl, Effersyllium, Per Diem Fiber, or the less expensive generic preparation in drug and health food stores. Although labeled a laxative, it really is not a laxative.  o Methylcellulose -- This is another fiber derived from wood which also retains water. It is available as Citrucel. o Polyethylene Glycol - and "artificial" fiber commonly called Miralax or Glycolax.  It is helpful for people with gassy or bloated feelings with regular fiber o Flax Seed - a less gassy fiber than psyllium   No reading or other relaxing activity while on the toilet. If bowel movements take longer than 5 minutes, you are too constipated   AVOID CONSTIPATION.  High fiber and water intake usually takes care of this.  Sometimes a laxative is needed to stimulate more frequent bowel movements, but    Laxatives are not a good long-term solution as it can wear the colon out. o Osmotics (Milk of Magnesia, Fleets phosphosoda, Magnesium citrate, MiraLax, GoLytely) are safer than  o Stimulants (Senokot, Castor Oil, Dulcolax, Ex Lax)    o Do not take laxatives for more than 7days in a row.    IF SEVERELY CONSTIPATED, try a Bowel Retraining Program: o Do not use laxatives.  o Eat a diet high in roughage, such as bran cereals and leafy vegetables.  o Drink six (6) ounces of prune or apricot juice each morning.  o Eat two (2) large servings of stewed fruit  each day.  o Take one (1) heaping tablespoon of a psyllium-based bulking agent twice a day. Use sugar-free sweetener when possible to avoid excessive calories.  o Eat a normal breakfast.  o Set aside 15 minutes after breakfast to sit on the toilet, but do not strain to have a bowel movement.  o If you do not have a bowel movement by the third day, use an enema and repeat the above steps.    Controlling diarrhea o Switch to liquids and simpler foods for a few days to avoid stressing your intestines further. o Avoid dairy  products (especially milk & ice cream) for a short time.  The intestines often can lose the ability to digest lactose when stressed. o Avoid foods that cause gassiness or bloating.  Typical foods include beans and other legumes, cabbage, broccoli, and dairy foods.  Every person has some sensitivity to other foods, so listen to our body and avoid those foods that trigger problems for you. o Adding fiber (Citrucel, Metamucil, psyllium, Miralax) gradually can help thicken stools by absorbing excess fluid and retrain the intestines to act more normally.  Slowly increase the dose over a few weeks.  Too much fiber too soon can backfire and cause cramping & bloating. o Probiotics (such as active yogurt, Align, etc) may help repopulate the intestines and colon with normal bacteria and calm down a sensitive digestive tract.  Most studies show it to be of mild help, though, and such products can be costly. o Medicines:   Bismuth subsalicylate (ex. Kayopectate, Pepto Bismol) every 30 minutes for up to 6 doses can help control diarrhea.  Avoid if pregnant.   Loperamide (Immodium) can slow down diarrhea.  Start with two tablets (4mg  total) first and then try one tablet every 6 hours.  Avoid if you are having fevers or severe pain.  If you are not better or start feeling worse, stop all medicines and call your doctor for advice o Call your doctor if you are getting worse or not better.  Sometimes further testing (cultures, endoscopy, X-ray studies, bloodwork, etc) may be needed to help diagnose and treat the cause of the diarrhea. o

## 2013-03-05 ENCOUNTER — Ambulatory Visit
Admission: RE | Admit: 2013-03-05 | Discharge: 2013-03-05 | Disposition: A | Discharge status: Home / Self Care | Payer: Medicare Other | Source: Ambulatory Visit

## 2013-03-05 DIAGNOSIS — Z1231 Encounter for screening mammogram for malignant neoplasm of breast: Secondary | ICD-10-CM

## 2013-06-10 ENCOUNTER — Other Ambulatory Visit: Payer: Self-pay | Admitting: Urology

## 2013-07-22 ENCOUNTER — Encounter (HOSPITAL_BASED_OUTPATIENT_CLINIC_OR_DEPARTMENT_OTHER): Payer: Self-pay | Admitting: *Deleted

## 2013-07-23 ENCOUNTER — Encounter (HOSPITAL_BASED_OUTPATIENT_CLINIC_OR_DEPARTMENT_OTHER): Payer: Self-pay | Admitting: *Deleted

## 2013-07-23 NOTE — Progress Notes (Signed)
NPO AFTER MN. ARRIVE AT 0900. NEEDS ISTAT AND EKG. WILL TAKE PRILOSEC AM DOS W/ SIPS OF WATER.

## 2013-07-27 NOTE — H&P (Signed)
History of Present Illness   Savannah Harris was completely dry with the Interstim PNE on the right side. She failed multiple medications as mentioned. She is going a lot less frequently. She changed it to the left side today and it did not work well.  Review of Systems: No change in bowel or neurologic systems.   Urinalysis: I reviewed, negative.   The device was removed in total. It had been disconnected and turned off prior. Skin looked good. She had a little bit of irritation at the edge of the waterproof dressing. Review of Systems: No other change in bowel or neurologic systems.    Past Medical History Problems  1. History of  Arthritis V13.4 2. History of  Hypercholesterolemia 272.0 3. History of  Stroke Syndrome 436  Surgical History Problems  1. History of  Hysterectomy V45.77 2. History of  Wrist Surgery Left  Current Meds 1. Aspirin 81 MG Oral Tablet Chewable; Therapy: (Recorded:30Jul2008) to 2. Atorvastatin Calcium 20 MG Oral Tablet; Therapy: 30Sep2013 to 3. Biotin TABS; Therapy: (Recorded:22Jan2013) to 4. Cephalexin 250 MG Oral Capsule; TAKE 1 CAPSULE 3 times daily; Therapy: 07Aug2014 to  (Evaluate:10Aug2014)  Requested for: 07Aug2014; Last Rx:07Aug2014 5. Clobetasol Propionate 0.05 % External Cream; Therapy: 22Apr2013 to 6. Estrace 0.1 MG/GM Vaginal Cream; 1 gm 3x/week x 4 weeks, then 1x per week; Therapy:  27Mar2013 to (Last Rx:27Mar2013) 7. Fish Oil OIL; Therapy: (Recorded:26Mar2012) to 8. Fluticasone Propionate 50 MCG/ACT Nasal Suspension; Therapy: 18Mar2014 to 9. FreshKote SOLN; Therapy: (Recorded:22Jan2013) to 10. Gabapentin 300 MG Oral Capsule; Therapy: 26Mar2014 to 11. Lisinopril-Hydrochlorothiazide 20-25 MG Oral Tablet; Therapy: 11Jul2011 to 12. MetFORMIN HCl ER 500 MG Oral Tablet Extended Release 24 Hour; Therapy: 15Mar2011 to 13. Myrbetriq 25 MG Oral Tablet Extended Release 24 Hour; Take 1 tablet daily; Therapy: 11Mar2014   to (Evaluate:06Mar2015); Last  Rx:11Mar2014 14. Uribel 118 MG Oral Capsule; TAKE 1 CAPSULE 3 times daily PRN Dysuria; Therapy: 29May2013   to (Last Rx:29May2013)  Requested for: 29May2013  Allergies Medication  1. Codeine Derivatives Non-Medication  2. Adhesive Tape 3. Tape  Family History Problems  1. Maternal history of  Acute Myocardial Infarction V17.3 2. Maternal history of  Alzheimer's Disease 3. Maternal history of  Death In The Family Father 32 car accident 4. Maternal history of  Death In The Family Mother Age 52 from Alzheimers 5. Maternal history of  Stroke Syndrome V17.1  Social History Problems  1. Activities Of Daily Living 2. Caffeine Use 1-2 a week 3. Exercise Habits reg exer in the pool, cardio and weights 2-3x a week 4. Living Independently With Spouse 5. Marital History - Currently Married 6. Never A Smoker 7. Occupation: Retired 8. Self-reliant In Usual Daily Activities  Vitals Vital Signs [Data Includes: Last 1 Day]  22Aug2014 02:25PM  BMI Calculated: 26.31 BSA Calculated: 1.65 Height: 5 ft 2 in Weight: 143 lb  Blood Pressure: 130 / 62, Sitting Temperature: 98.3 F, Oral Heart Rate: 79 Respiration: 18  Results/Data  Urine [Data Includes: Last 1 Day]   22Aug2014  COLOR STRAW   APPEARANCE CLEAR   SPECIFIC GRAVITY <1.005   pH 6.0   GLUCOSE 500 mg/dL  BILIRUBIN NEG   KETONE NEG mg/dL  BLOOD NEG   PROTEIN NEG mg/dL  UROBILINOGEN 0.2 mg/dL  NITRITE NEG   LEUKOCYTE ESTERASE NEG    Assessment Assessed  1. Urge Incontinence Of Urine 788.31 2. Urinary Frequency  Plan   Discussion/Summary   Ms. Carrigg would like to schedule an Interstim. We will  proceed accordingly.   After a thorough review of the management options for the patient's condition the patient  elected to proceed with surgical therapy as noted above. We have discussed the potential benefits and risks of the procedure, side effects of the proposed treatment, the likelihood of the patient achieving the  goals of the procedure, and any potential problems that might occur during the procedure or recuperation. Informed consent has been obtained.

## 2013-07-28 ENCOUNTER — Encounter (HOSPITAL_BASED_OUTPATIENT_CLINIC_OR_DEPARTMENT_OTHER)
Admission: RE | Disposition: A | Discharge status: Home / Self Care | Payer: Self-pay | Source: Ambulatory Visit | Attending: Urology

## 2013-07-28 ENCOUNTER — Other Ambulatory Visit: Payer: Self-pay

## 2013-07-28 ENCOUNTER — Ambulatory Visit (HOSPITAL_COMMUNITY): Payer: Medicare Other

## 2013-07-28 ENCOUNTER — Ambulatory Visit (HOSPITAL_BASED_OUTPATIENT_CLINIC_OR_DEPARTMENT_OTHER): Payer: Medicare Other | Admitting: Anesthesiology

## 2013-07-28 ENCOUNTER — Encounter (HOSPITAL_BASED_OUTPATIENT_CLINIC_OR_DEPARTMENT_OTHER): Payer: Self-pay | Admitting: Anesthesiology

## 2013-07-28 ENCOUNTER — Ambulatory Visit (HOSPITAL_BASED_OUTPATIENT_CLINIC_OR_DEPARTMENT_OTHER)
Admission: RE | Admit: 2013-07-28 | Discharge: 2013-07-28 | Disposition: A | Discharge status: Home / Self Care | Payer: Medicare Other | Source: Ambulatory Visit | Attending: Urology | Admitting: Urology

## 2013-07-28 ENCOUNTER — Encounter (HOSPITAL_BASED_OUTPATIENT_CLINIC_OR_DEPARTMENT_OTHER): Payer: Medicare Other | Admitting: Anesthesiology

## 2013-07-28 DIAGNOSIS — Z7982 Long term (current) use of aspirin: Secondary | ICD-10-CM | POA: Insufficient documentation

## 2013-07-28 DIAGNOSIS — Z8673 Personal history of transient ischemic attack (TIA), and cerebral infarction without residual deficits: Secondary | ICD-10-CM | POA: Insufficient documentation

## 2013-07-28 DIAGNOSIS — I10 Essential (primary) hypertension: Secondary | ICD-10-CM | POA: Insufficient documentation

## 2013-07-28 DIAGNOSIS — K219 Gastro-esophageal reflux disease without esophagitis: Secondary | ICD-10-CM | POA: Insufficient documentation

## 2013-07-28 DIAGNOSIS — E7800 Pure hypercholesterolemia, unspecified: Secondary | ICD-10-CM | POA: Insufficient documentation

## 2013-07-28 DIAGNOSIS — Z79899 Other long term (current) drug therapy: Secondary | ICD-10-CM | POA: Insufficient documentation

## 2013-07-28 DIAGNOSIS — R350 Frequency of micturition: Secondary | ICD-10-CM | POA: Insufficient documentation

## 2013-07-28 DIAGNOSIS — Z90710 Acquired absence of both cervix and uterus: Secondary | ICD-10-CM | POA: Insufficient documentation

## 2013-07-28 DIAGNOSIS — I739 Peripheral vascular disease, unspecified: Secondary | ICD-10-CM | POA: Insufficient documentation

## 2013-07-28 DIAGNOSIS — N3941 Urge incontinence: Secondary | ICD-10-CM | POA: Insufficient documentation

## 2013-07-28 DIAGNOSIS — E119 Type 2 diabetes mellitus without complications: Secondary | ICD-10-CM | POA: Insufficient documentation

## 2013-07-28 HISTORY — DX: Unspecified osteoarthritis, unspecified site: M19.90

## 2013-07-28 HISTORY — PX: INTERSTIM IMPLANT PLACEMENT: SHX5130

## 2013-07-28 HISTORY — DX: Type 2 diabetes mellitus without complications: E11.9

## 2013-07-28 HISTORY — DX: Occlusion and stenosis of unspecified vertebral artery: I65.09

## 2013-07-28 HISTORY — DX: Gastro-esophageal reflux disease without esophagitis: K21.9

## 2013-07-28 HISTORY — DX: Urge incontinence: N39.41

## 2013-07-28 SURGERY — INSERTION, SACRAL NERVE STIMULATOR, INTERSTIM, STAGE 1
Anesthesia: Monitor Anesthesia Care | Site: Buttocks | Wound class: Clean

## 2013-07-28 MED ORDER — PROPOFOL 10 MG/ML IV EMUL
INTRAVENOUS | Status: DC | PRN
  Administered 2013-07-28: 50 ug/kg/min via INTRAVENOUS

## 2013-07-28 MED ORDER — CEFAZOLIN SODIUM 1-5 GM-% IV SOLN
1.0000 g | INTRAVENOUS | Status: DC
  Filled 2013-07-28: qty 50

## 2013-07-28 MED ORDER — KETOROLAC TROMETHAMINE 30 MG/ML IJ SOLN
INTRAMUSCULAR | Status: DC | PRN
  Administered 2013-07-28: 15 mg via INTRAVENOUS

## 2013-07-28 MED ORDER — FENTANYL CITRATE 0.05 MG/ML IJ SOLN
25.0000 ug | INTRAMUSCULAR | Status: DC | PRN
  Filled 2013-07-28: qty 1

## 2013-07-28 MED ORDER — FENTANYL CITRATE 0.05 MG/ML IJ SOLN
INTRAMUSCULAR | Status: DC | PRN
  Administered 2013-07-28 (×8): 6.25 ug via INTRAVENOUS

## 2013-07-28 MED ORDER — LACTATED RINGERS IV SOLN
INTRAVENOUS | Status: DC
  Administered 2013-07-28 (×2): via INTRAVENOUS
  Filled 2013-07-28: qty 1000

## 2013-07-28 MED ORDER — CEFAZOLIN SODIUM-DEXTROSE 2-3 GM-% IV SOLR
INTRAVENOUS | Status: DC | PRN
  Administered 2013-07-28: 2 g via INTRAVENOUS

## 2013-07-28 MED ORDER — LACTATED RINGERS IV SOLN
INTRAVENOUS | Status: DC | PRN
  Administered 2013-07-28: 10:00:00 via INTRAVENOUS

## 2013-07-28 MED ORDER — ONDANSETRON HCL 4 MG/2ML IJ SOLN
INTRAMUSCULAR | Status: DC | PRN
  Administered 2013-07-28: 4 mg via INTRAMUSCULAR

## 2013-07-28 MED ORDER — CEFAZOLIN SODIUM-DEXTROSE 2-3 GM-% IV SOLR
2.0000 g | INTRAVENOUS | Status: DC
  Filled 2013-07-28: qty 50

## 2013-07-28 MED ORDER — HYDROCODONE-ACETAMINOPHEN 5-325 MG PO TABS: 1.0000 | ORAL_TABLET | Freq: Four times a day (QID) | ORAL | Status: DC | PRN

## 2013-07-28 MED ORDER — BUPIVACAINE-EPINEPHRINE 0.5% -1:200000 IJ SOLN
INTRAMUSCULAR | Status: DC | PRN
  Administered 2013-07-28: 15 mL

## 2013-07-28 MED ORDER — PROMETHAZINE HCL 25 MG/ML IJ SOLN
6.2500 mg | INTRAMUSCULAR | Status: DC | PRN
  Filled 2013-07-28: qty 1

## 2013-07-28 MED ORDER — LIDOCAINE-EPINEPHRINE (PF) 1 %-1:200000 IJ SOLN
INTRAMUSCULAR | Status: DC | PRN
  Administered 2013-07-28: 15 mL

## 2013-07-28 MED ORDER — CEPHALEXIN 250 MG PO CAPS: 500.0000 mg | ORAL_CAPSULE | Freq: Three times a day (TID) | ORAL | Status: DC

## 2013-07-28 SURGICAL SUPPLY — 54 items
ANTNA NRSTM XTRN TELEM NS LF (UROLOGICAL SUPPLIES) ×1
BAG URINE DRAINAGE (UROLOGICAL SUPPLIES) ×2 IMPLANT
BAG URINE LEG 500ML (DRAIN) ×2 IMPLANT
BANDAGE ADHESIVE 1X3 (GAUZE/BANDAGES/DRESSINGS) IMPLANT
BENZOIN TINCTURE PRP APPL 2/3 (GAUZE/BANDAGES/DRESSINGS) ×4 IMPLANT
BLADE HEX COATED 2.75 (ELECTRODE) ×2 IMPLANT
BLADE SURG 15 STRL LF DISP TIS (BLADE) ×1 IMPLANT
BLADE SURG 15 STRL SS (BLADE) ×1
CATH FOLEY 2WAY SLVR  5CC 16FR (CATHETERS) ×1
CATH FOLEY 2WAY SLVR 5CC 16FR (CATHETERS) ×1 IMPLANT
CLOTH BEACON ORANGE TIMEOUT ST (SAFETY) ×2 IMPLANT
COVER MAYO STAND STRL (DRAPES) ×2 IMPLANT
COVER PROBE W GEL 5X96 (DRAPES) ×2 IMPLANT
COVER TABLE BACK 60X90 (DRAPES) ×2 IMPLANT
DERMABOND ADVANCED (GAUZE/BANDAGES/DRESSINGS) ×1
DERMABOND ADVANCED .7 DNX12 (GAUZE/BANDAGES/DRESSINGS) ×1 IMPLANT
DRAPE C-ARM 42X72 X-RAY (DRAPES) ×4 IMPLANT
DRAPE INCISE 23X17 IOBAN STRL (DRAPES) ×1
DRAPE INCISE IOBAN 23X17 STRL (DRAPES) ×1 IMPLANT
DRAPE LAPAROSCOPIC ABDOMINAL (DRAPES) ×2 IMPLANT
DRAPE LG THREE QUARTER DISP (DRAPES) ×2 IMPLANT
DRESSING TELFA 8X3 (GAUZE/BANDAGES/DRESSINGS) ×2 IMPLANT
DRSG TEGADERM 4X4.75 (GAUZE/BANDAGES/DRESSINGS) ×2 IMPLANT
ELECT REM PT RETURN 9FT ADLT (ELECTROSURGICAL) ×2
ELECTRODE REM PT RTRN 9FT ADLT (ELECTROSURGICAL) ×1 IMPLANT
GAUZE SPONGE 4X4 12PLY STRL LF (GAUZE/BANDAGES/DRESSINGS) IMPLANT
GLOVE BIO SURGEON STRL SZ7.5 (GLOVE) ×2 IMPLANT
GOWN PREVENTION PLUS LG XLONG (DISPOSABLE) ×2 IMPLANT
GOWN STRL REIN XL XLG (GOWN DISPOSABLE) ×2 IMPLANT
HOLDER FOLEY CATH W/STRAP (MISCELLANEOUS) ×2 IMPLANT
INTRODUCER GUIDE DILATR SHEATH (SET/KITS/TRAYS/PACK) ×2 IMPLANT
LEAD (Lead) ×2 IMPLANT
NEEDLE FORAMEN 20GA 3.5  9CM (NEEDLE) IMPLANT
NEEDLE FORAMEN 20GA 5  12.5CM (NEEDLE) IMPLANT
NEEDLE HYPO 22GX1.5 SAFETY (NEEDLE) ×2 IMPLANT
PACK BASIN DAY SURGERY FS (CUSTOM PROCEDURE TRAY) ×2 IMPLANT
PENCIL BUTTON HOLSTER BLD 10FT (ELECTRODE) ×2 IMPLANT
PROGRAMMER ANTENNA EXT (UROLOGICAL SUPPLIES) ×2 IMPLANT
PROGRAMMER STIMUL 2.2X1.1X3.7 (UROLOGICAL SUPPLIES) ×2 IMPLANT
STAPLER VISISTAT 35W (STAPLE) IMPLANT
STIMULATOR INTERSTIM 2X1.7X.3 (Orthopedic Implant) ×2 IMPLANT
STRIP CLOSURE SKIN 1/2X4 (GAUZE/BANDAGES/DRESSINGS) ×2 IMPLANT
SUT SILK 2 0 (SUTURE) ×1
SUT SILK 2-0 18XBRD TIE 12 (SUTURE) ×1 IMPLANT
SUT VIC AB 3-0 SH 27 (SUTURE) ×4
SUT VIC AB 3-0 SH 27X BRD (SUTURE) ×2 IMPLANT
SUT VICRYL 4-0 PS2 18IN ABS (SUTURE) ×4 IMPLANT
SYR BULB IRRIGATION 50ML (SYRINGE) ×2 IMPLANT
SYR CONTROL 10ML LL (SYRINGE) ×2 IMPLANT
SYRINGE 10CC LL (SYRINGE) ×2 IMPLANT
TAPE STRIPS DRAPE STRL (GAUZE/BANDAGES/DRESSINGS) ×2 IMPLANT
TOWEL OR 17X24 6PK STRL BLUE (TOWEL DISPOSABLE) ×4 IMPLANT
TRAY DSU PREP LF (CUSTOM PROCEDURE TRAY) ×2 IMPLANT
WATER STERILE IRR 500ML POUR (IV SOLUTION) ×2 IMPLANT

## 2013-07-28 NOTE — Anesthesia Postprocedure Evaluation (Signed)
  Anesthesia Post-op Note  Patient: Savannah Harris  Procedure(s) Performed: Procedure(s) (LRB): INTERSTIM IMPLANT FIRST STAGE (N/A) INTERSTIM IMPLANT SECOND STAGE (N/A)  Patient Location: PACU  Anesthesia Type: MAC  Level of Consciousness: awake and alert   Airway and Oxygen Therapy: Patient Spontanous Breathing  Post-op Pain: mild  Post-op Assessment: Post-op Vital signs reviewed, Patient's Cardiovascular Status Stable, Respiratory Function Stable, Patent Airway and No signs of Nausea or vomiting  Last Vitals:  Filed Vitals:   07/28/13 1230  BP: 103/48  Pulse: 66  Temp:   Resp: 13    Post-op Vital Signs: stable   Complications: No apparent anesthesia complications

## 2013-07-28 NOTE — Transfer of Care (Signed)
Immediate Anesthesia Transfer of Care Note  Patient: Savannah Harris  Procedure(s) Performed: Procedure(s) (LRB): INTERSTIM IMPLANT FIRST STAGE (N/A) INTERSTIM IMPLANT SECOND STAGE (N/A)  Patient Location: PACU  Anesthesia Type: General  Level of Consciousness: awake, sedated, patient cooperative and responds to stimulation  Airway & Oxygen Therapy: Patient Spontanous Breathing and Patient connected to face mask oxygen  Post-op Assessment: Report given to PACU RN, Post -op Vital signs reviewed and stable and Patient moving all extremities  Post vital signs: Reviewed and stable  Complications: No apparent anesthesia complications

## 2013-07-28 NOTE — Op Note (Signed)
Preoperative diagnosis: Refractory urgency incontinence and frequency Postoperative diagnosis: Refractory urgency incontinence and frequency Surgery: Insertion of InterStim stage I and stage II and fluoroscopy and impedance check Surgeon: Dr. Lorin Picket Tony Granquist  The patient has the above diagnoses and consented the above procedure. Preoperative antibiotics were given. Extra care was taken with skin preparation and exposure of sacrum.  Patient was in the prone position. After draping fluoroscopy and bony landmarks were used to mark S3 foramina on right and left side. On the right side I instilled 10 cc of a lidocaine epinephrine mixture. I could easily find the S3 foramina with the 3.5 inch foramen needle. Lateral fluoroscopy was also utilized.  She had excellent toe response at low settings and good bellows response.  Inner core of framing needle was removed and the guide was placed to appropriate depth. Appropriate 1 cm skin incision was made. White trocar was passed under fluoroscopic guidance to the correct level. The guidewire was removed.  The framing needle was passed to the appropriate level with the wire pulled back mildly. We tested all positions. She had a mild toe response at position 0 and an excellent toe response and bellows at position one, 2 and 3   Utilizing fluoroscopy I removed the inner core of the lead not changing its position. Retesting was performed. Lateral and anterior posterior x-rays taken.   using bony landmarks and x-ray I marked the right 4 cm high buttock incision well below her pubic crest. 15 cc of a lidocaine epinephrine mixture was utilized. I made an incision and a nice pocket 1/2 cm deep to the skin.  Utilizing delivery device I passed the lead from medial to lateral. It was connected to the generator was screwdriver as per protocol. It was inserted with label up.  Impedance check was done and impedance was normal in all 4-lead positions. This was done with  sterile technique  Incisions were lightly irrigated. Medial incision was closed with 2 interrupted 4-0 Vicryl suture. Lateral incision was closed with 3-0 Vicryl subcutaneous and 4-0 subcuticular. Sterile dressing was applied  I was very pleased with the surgery and hopefully it reaches her treatment goal

## 2013-07-28 NOTE — Interval H&P Note (Signed)
History and Physical Interval Note:  07/28/2013 7:06 AM  Savannah Harris  has presented today for surgery, with the diagnosis of URGE INCONTINENCE  The various methods of treatment have been discussed with the patient and family. After consideration of risks, benefits and other options for treatment, the patient has consented to  Procedure(s): INTERSTIM IMPLANT FIRST STAGE (N/A) INTERSTIM IMPLANT SECOND STAGE (N/A) as a surgical intervention .  The patient's history has been reviewed, patient examined, no change in status, stable for surgery.  I have reviewed the patient's chart and labs.  Questions were answered to the patient's satisfaction.     Poet Hineman A

## 2013-07-28 NOTE — Anesthesia Preprocedure Evaluation (Addendum)
Anesthesia Evaluation  Patient identified by MRN, date of birth, ID band Patient awake    Reviewed: Allergy & Precautions, H&P , NPO status , Patient's Chart, lab work & pertinent test results  Airway Mallampati: II TM Distance: >3 FB Neck ROM: Full    Dental no notable dental hx.    Pulmonary neg pulmonary ROS,  breath sounds clear to auscultation  Pulmonary exam normal       Cardiovascular Exercise Tolerance: Good hypertension, Pt. on medications + Peripheral Vascular Disease negative cardio ROS  Rhythm:Regular Rate:Normal  ECG: NSR, evidence (Low Voltage) consistent with anterior infarct.  No cardiovascular symptoms per patient.    Neuro/Psych  Neuromuscular disease negative psych ROS   GI/Hepatic negative GI ROS, Neg liver ROS, GERD-  Medicated,  Endo/Other  diabetes, Type 2, Oral Hypoglycemic Agents  Renal/GU negative Renal ROS  negative genitourinary   Musculoskeletal negative musculoskeletal ROS (+)   Abdominal   Peds negative pediatric ROS (+)  Hematology negative hematology ROS (+)   Anesthesia Other Findings   Reproductive/Obstetrics negative OB ROS                          Anesthesia Physical Anesthesia Plan  ASA: III  Anesthesia Plan: MAC   Post-op Pain Management:    Induction: Intravenous  Airway Management Planned:   Additional Equipment:   Intra-op Plan:   Post-operative Plan:   Informed Consent: I have reviewed the patients History and Physical, chart, labs and discussed the procedure including the risks, benefits and alternatives for the proposed anesthesia with the patient or authorized representative who has indicated his/her understanding and acceptance.   Dental advisory given  Plan Discussed with: CRNA  Anesthesia Plan Comments:         Anesthesia Quick Evaluation

## 2013-07-29 ENCOUNTER — Encounter (HOSPITAL_BASED_OUTPATIENT_CLINIC_OR_DEPARTMENT_OTHER): Payer: Self-pay | Admitting: Urology

## 2013-07-29 LAB — POCT I-STAT 4, (NA,K, GLUC, HGB,HCT)
HCT: 39 % (ref 36.0–46.0)
Potassium: 4.2 mEq/L (ref 3.5–5.1)

## 2013-10-07 ENCOUNTER — Encounter (HOSPITAL_COMMUNITY): Payer: Self-pay | Admitting: Emergency Medicine

## 2013-10-07 ENCOUNTER — Emergency Department (HOSPITAL_COMMUNITY)
Admission: EM | Admit: 2013-10-07 | Discharge: 2013-10-07 | Disposition: A | Discharge status: Home / Self Care | Payer: Medicare Other | Attending: Emergency Medicine | Admitting: Emergency Medicine

## 2013-10-07 ENCOUNTER — Emergency Department (HOSPITAL_COMMUNITY): Payer: Medicare Other

## 2013-10-07 DIAGNOSIS — K5732 Diverticulitis of large intestine without perforation or abscess without bleeding: Secondary | ICD-10-CM | POA: Insufficient documentation

## 2013-10-07 DIAGNOSIS — K219 Gastro-esophageal reflux disease without esophagitis: Secondary | ICD-10-CM | POA: Insufficient documentation

## 2013-10-07 DIAGNOSIS — E119 Type 2 diabetes mellitus without complications: Secondary | ICD-10-CM | POA: Insufficient documentation

## 2013-10-07 DIAGNOSIS — M129 Arthropathy, unspecified: Secondary | ICD-10-CM | POA: Insufficient documentation

## 2013-10-07 DIAGNOSIS — Z7982 Long term (current) use of aspirin: Secondary | ICD-10-CM | POA: Insufficient documentation

## 2013-10-07 DIAGNOSIS — Z9849 Cataract extraction status, unspecified eye: Secondary | ICD-10-CM | POA: Insufficient documentation

## 2013-10-07 DIAGNOSIS — Z91040 Latex allergy status: Secondary | ICD-10-CM | POA: Insufficient documentation

## 2013-10-07 DIAGNOSIS — Z885 Allergy status to narcotic agent status: Secondary | ICD-10-CM | POA: Insufficient documentation

## 2013-10-07 DIAGNOSIS — Z79899 Other long term (current) drug therapy: Secondary | ICD-10-CM | POA: Insufficient documentation

## 2013-10-07 DIAGNOSIS — E785 Hyperlipidemia, unspecified: Secondary | ICD-10-CM | POA: Insufficient documentation

## 2013-10-07 DIAGNOSIS — Z87448 Personal history of other diseases of urinary system: Secondary | ICD-10-CM | POA: Insufficient documentation

## 2013-10-07 DIAGNOSIS — K5792 Diverticulitis of intestine, part unspecified, without perforation or abscess without bleeding: Secondary | ICD-10-CM

## 2013-10-07 DIAGNOSIS — I10 Essential (primary) hypertension: Secondary | ICD-10-CM | POA: Insufficient documentation

## 2013-10-07 DIAGNOSIS — I6509 Occlusion and stenosis of unspecified vertebral artery: Secondary | ICD-10-CM | POA: Insufficient documentation

## 2013-10-07 LAB — URINALYSIS, ROUTINE W REFLEX MICROSCOPIC
Glucose, UA: NEGATIVE mg/dL
Hgb urine dipstick: NEGATIVE
Ketones, ur: NEGATIVE mg/dL
Protein, ur: NEGATIVE mg/dL

## 2013-10-07 LAB — BASIC METABOLIC PANEL
CO2: 28 mEq/L (ref 19–32)
Calcium: 9.5 mg/dL (ref 8.4–10.5)
Creatinine, Ser: 0.97 mg/dL (ref 0.50–1.10)
Glucose, Bld: 128 mg/dL — ABNORMAL HIGH (ref 70–99)
Potassium: 3.8 mEq/L (ref 3.5–5.1)
Sodium: 142 mEq/L (ref 135–145)

## 2013-10-07 LAB — CBC WITH DIFFERENTIAL/PLATELET
Basophils Relative: 0 % (ref 0–1)
Eosinophils Relative: 1 % (ref 0–5)
HCT: 38.2 % (ref 36.0–46.0)
Lymphocytes Relative: 24 % (ref 12–46)
Lymphs Abs: 1 10*3/uL (ref 0.7–4.0)
MCV: 89.7 fL (ref 78.0–100.0)
Monocytes Absolute: 0.4 10*3/uL (ref 0.1–1.0)
Neutro Abs: 2.8 10*3/uL (ref 1.7–7.7)
Neutrophils Relative %: 66 % (ref 43–77)
Platelets: 136 10*3/uL — ABNORMAL LOW (ref 150–400)
RBC: 4.26 MIL/uL (ref 3.87–5.11)
RDW: 12.7 % (ref 11.5–15.5)
WBC: 4.2 10*3/uL (ref 4.0–10.5)

## 2013-10-07 MED ORDER — CIPROFLOXACIN HCL 500 MG PO TABS
500.0000 mg | ORAL_TABLET | Freq: Once | ORAL | Status: AC
  Administered 2013-10-07: 500 mg via ORAL
  Filled 2013-10-07: qty 1

## 2013-10-07 MED ORDER — IOHEXOL 300 MG/ML  SOLN
100.0000 mL | Freq: Once | INTRAMUSCULAR | Status: AC | PRN
  Administered 2013-10-07: 80 mL via INTRAVENOUS

## 2013-10-07 MED ORDER — IOHEXOL 300 MG/ML  SOLN
25.0000 mL | INTRAMUSCULAR | Status: AC
  Administered 2013-10-07 (×2): 25 mL via ORAL

## 2013-10-07 MED ORDER — HYDROCODONE-ACETAMINOPHEN 5-325 MG PO TABS: 1.0000 | ORAL_TABLET | Freq: Four times a day (QID) | ORAL | Status: DC | PRN

## 2013-10-07 MED ORDER — METRONIDAZOLE 500 MG PO TABS: 500.0000 mg | ORAL_TABLET | Freq: Two times a day (BID) | ORAL | Status: DC

## 2013-10-07 MED ORDER — MORPHINE SULFATE 4 MG/ML IJ SOLN: 4.0000 mg | Freq: Once | INTRAMUSCULAR | Status: DC

## 2013-10-07 MED ORDER — SODIUM CHLORIDE 0.9 % IV BOLUS (SEPSIS)
1000.0000 mL | Freq: Once | INTRAVENOUS | Status: AC
  Administered 2013-10-07: 1000 mL via INTRAVENOUS

## 2013-10-07 MED ORDER — MORPHINE SULFATE 4 MG/ML IJ SOLN
4.0000 mg | Freq: Once | INTRAMUSCULAR | Status: AC
  Administered 2013-10-07: 4 mg via INTRAVENOUS
  Filled 2013-10-07: qty 1

## 2013-10-07 MED ORDER — CIPROFLOXACIN HCL 500 MG PO TABS: 500.0000 mg | ORAL_TABLET | Freq: Two times a day (BID) | ORAL | Status: DC

## 2013-10-07 MED ORDER — METRONIDAZOLE 500 MG PO TABS
500.0000 mg | ORAL_TABLET | Freq: Once | ORAL | Status: AC
  Administered 2013-10-07: 500 mg via ORAL
  Filled 2013-10-07: qty 1

## 2013-10-07 NOTE — ED Notes (Signed)
Pt. Arrived by POV with abd pain in the LLQ. Pain is described as sharp/aching. Pt. Denies fever, n/v. No OTC's taken.

## 2013-10-07 NOTE — ED Notes (Signed)
Called CT to inform patient finishing contrast.

## 2013-10-07 NOTE — ED Provider Notes (Addendum)
CSN: [id number]     Arrival date & time 10/07/13  1122 History   First MD Initiated Contact with Patient 10/07/13 1135     Chief Complaint  Patient presents with  . Abdominal Pain   (Consider location/radiation/quality/duration/timing/severity/associated sxs/prior Treatment) HPI Comments: Pt comes in with cc of LLQ pain. Pt's pain started y'day, and is located in the LLQ, and is constant, non radiating. Pt has no uti like sx, no hx of diverticulitis and denies any hx of similar pain in the past. PMHx of diabetes.  Patient is a 77 y.o. female presenting with abdominal pain. The history is provided by the patient.  Abdominal Pain Associated symptoms: no chest pain, no chills, no constipation, no cough, no diarrhea, no fever, no hematuria, no nausea, no shortness of breath and no vomiting     Past Medical History  Diagnosis Date  . Hypertension   . Hyperlipidemia   . Leg cramps     OCCASIONAL  . Type 2 diabetes mellitus   . Urge urinary incontinence   . GERD (gastroesophageal reflux disease)   . Arthritis     WRIST/ HANDS  . Asymptomatic vertebral artery stenosis     RIGHT SIDE   Past Surgical History  Procedure Laterality Date  . Total hip arthroplasty Right 12-16-2008  . Pubovaginal sling  08-26-2007    SPARC SLING  . Total abdominal hysterectomy w/ bilateral salpingoophorectomy  1970s    AND APPENDECTOMY  . Tonsillectomy and adenoidectomy  AS CHILD  . Left wrsit tendon repair  1999  . Cataract extraction w/ intraocular lens  implant, bilateral    . Interstim implant placement N/A 07/28/2013    Procedure: Leane Platt IMPLANT FIRST STAGE;  Surgeon: Martina Sinner, MD;  Location: [facility];  Service: Urology;  Laterality: N/A;  . Interstim implant placement N/A 07/28/2013    Procedure: INTERSTIM IMPLANT SECOND STAGE;  Surgeon: Martina Sinner, MD;  Location: [facility];  Service: Urology;  Laterality: N/A;   Family History  Problem  Relation Age of Onset  . Diabetes Mother    History  Substance Use Topics  . Smoking status: Never Smoker   . Smokeless tobacco: Never Used  . Alcohol Use: No   OB History   Grav Para Term Preterm Abortions TAB SAB Ect Mult Living                 Review of Systems  Constitutional: Negative for fever, chills and activity change.  HENT: Negative for facial swelling.   Respiratory: Negative for cough, shortness of breath and wheezing.   Cardiovascular: Negative for chest pain.  Gastrointestinal: Positive for abdominal pain. Negative for nausea, vomiting, diarrhea, constipation, blood in stool and abdominal distention.  Genitourinary: Negative for hematuria and difficulty urinating.  Musculoskeletal: Negative for neck pain.  Skin: Negative for color change.  Neurological: Negative for speech difficulty.  Hematological: Does not bruise/bleed easily.  Psychiatric/Behavioral: Negative for confusion.    Allergies  Adhesive; Codeine; and Latex  Home Medications   Current Outpatient Rx  Name  Route  Sig  Dispense  Refill  . aspirin 81 MG tablet   Oral   Take 81 mg by mouth daily.         . Biotin 5000 MCG CAPS   Oral   Take 1 capsule by mouth every morning.         . Cholecalciferol (VITAMIN D3) 5000 UNITS CAPS   Oral   Take 1 capsule by  mouth daily.         . CycloSPORINE (RESTASIS OP)   Both Eyes   Place 1 drop into both eyes daily.         . fish oil-omega-3 fatty acids 1000 MG capsule   Oral   Take 1 g by mouth daily.          Marland Kitchen glimepiride (AMARYL) 2 MG tablet   Oral   Take 2 mg by mouth daily before breakfast.         . lisinopril-hydrochlorothiazide (PRINZIDE,ZESTORETIC) 20-25 MG per tablet   Oral   Take 1 tablet by mouth every morning.          . metFORMIN (GLUCOPHAGE-XR) 500 MG 24 hr tablet   Oral   Take 1,000 mg by mouth 2 (two) times daily.         Marland Kitchen omeprazole (PRILOSEC) 20 MG capsule   Oral   Take 20 mg by mouth as needed.           Bertram Gala Glycol-Propyl Glycol (SYSTANE OP)   Both Eyes   Place 1 drop into both eyes 3 (three) times daily as needed (dry eyes).         . Polyethylene Glycol 3350 (MIRALAX PO)   Oral   Take by mouth daily.          BP 122/43  Pulse 71  Temp(Src) 97.6 F (36.4 C)  Ht 5\' 2"  (1.575 m)  Wt 145 lb (65.772 kg)  BMI 26.51 kg/m2  SpO2 97% Physical Exam  Nursing note and vitals reviewed. Constitutional: She is oriented to person, place, and time. She appears well-developed and well-nourished.  HENT:  Head: Normocephalic and atraumatic.  Eyes: EOM are normal. Pupils are equal, round, and reactive to light.  Neck: Neck supple.  Cardiovascular: Normal rate, regular rhythm and normal heart sounds.   No murmur heard. Pulmonary/Chest: Effort normal. No respiratory distress.  Abdominal: Soft. She exhibits no distension. There is tenderness. There is guarding. There is no rebound.  LLQ tenderness  Neurological: She is alert and oriented to person, place, and time.  Skin: Skin is warm and dry.    ED Course  Procedures (including critical care time) Labs Review Labs Reviewed  CBC WITH DIFFERENTIAL - Abnormal; Notable for the following:    Platelets 136 (*)    All other components within normal limits  URINALYSIS, ROUTINE W REFLEX MICROSCOPIC  BASIC METABOLIC PANEL   Imaging Review No results found.  EKG Interpretation   None       MDM  No diagnosis found.  DDx includes: Colitis Intra abdominal abscess Thrombosis Mesenteric ischemia Diverticulitis Hernia Nephrolithiasis Pyelonephritis UTI/Cystitis  Pt comes in with cc of LLQ abd pain. Likely diverticulitis - but CT from 2012 didn't show significant diverticular dx - so we will get CT abd.   Derwood Kaplan, MD 10/07/13 1327  4:58 PM CT confirms diverticulitis. Tolerating po well, no concerning constitutionals, Return precaution discussed.   Derwood Kaplan, MD 10/07/13 1659

## 2013-10-07 NOTE — ED Notes (Signed)
MD at bedside. 

## 2013-11-10 ENCOUNTER — Other Ambulatory Visit: Payer: Self-pay | Admitting: Gastroenterology

## 2013-11-25 ENCOUNTER — Encounter (HOSPITAL_COMMUNITY): Payer: Self-pay | Admitting: Pharmacy Technician

## 2013-11-30 ENCOUNTER — Encounter (HOSPITAL_COMMUNITY): Payer: Self-pay | Admitting: *Deleted

## 2013-11-30 DIAGNOSIS — K5792 Diverticulitis of intestine, part unspecified, without perforation or abscess without bleeding: Secondary | ICD-10-CM

## 2013-11-30 DIAGNOSIS — G4730 Sleep apnea, unspecified: Secondary | ICD-10-CM

## 2013-11-30 HISTORY — DX: Sleep apnea, unspecified: G47.30

## 2013-11-30 HISTORY — DX: Diverticulitis of intestine, part unspecified, without perforation or abscess without bleeding: K57.92

## 2013-11-30 NOTE — Progress Notes (Signed)
Was told to take only Aspirin AM of procedure by Dr. Henriette CombsJohnson's office.

## 2013-12-15 ENCOUNTER — Encounter (HOSPITAL_COMMUNITY): Payer: Self-pay | Admitting: *Deleted

## 2013-12-15 ENCOUNTER — Ambulatory Visit (HOSPITAL_COMMUNITY)
Admission: RE | Admit: 2013-12-15 | Discharge: 2013-12-15 | Disposition: A | Discharge status: Home / Self Care | Payer: Medicare Other | Source: Ambulatory Visit | Attending: Gastroenterology | Admitting: Gastroenterology

## 2013-12-15 ENCOUNTER — Encounter (HOSPITAL_COMMUNITY): Payer: Medicare Other | Admitting: *Deleted

## 2013-12-15 ENCOUNTER — Ambulatory Visit (HOSPITAL_COMMUNITY): Payer: Medicare Other | Admitting: *Deleted

## 2013-12-15 ENCOUNTER — Encounter (HOSPITAL_COMMUNITY)
Admission: RE | Disposition: A | Discharge status: Home / Self Care | Payer: Self-pay | Source: Ambulatory Visit | Attending: Gastroenterology

## 2013-12-15 DIAGNOSIS — I672 Cerebral atherosclerosis: Secondary | ICD-10-CM | POA: Insufficient documentation

## 2013-12-15 DIAGNOSIS — G43909 Migraine, unspecified, not intractable, without status migrainosus: Secondary | ICD-10-CM | POA: Insufficient documentation

## 2013-12-15 DIAGNOSIS — Z79899 Other long term (current) drug therapy: Secondary | ICD-10-CM | POA: Insufficient documentation

## 2013-12-15 DIAGNOSIS — Z9089 Acquired absence of other organs: Secondary | ICD-10-CM | POA: Insufficient documentation

## 2013-12-15 DIAGNOSIS — I10 Essential (primary) hypertension: Secondary | ICD-10-CM | POA: Insufficient documentation

## 2013-12-15 DIAGNOSIS — M899 Disorder of bone, unspecified: Secondary | ICD-10-CM | POA: Insufficient documentation

## 2013-12-15 DIAGNOSIS — K219 Gastro-esophageal reflux disease without esophagitis: Secondary | ICD-10-CM | POA: Insufficient documentation

## 2013-12-15 DIAGNOSIS — E7800 Pure hypercholesterolemia, unspecified: Secondary | ICD-10-CM | POA: Insufficient documentation

## 2013-12-15 DIAGNOSIS — K5732 Diverticulitis of large intestine without perforation or abscess without bleeding: Secondary | ICD-10-CM | POA: Insufficient documentation

## 2013-12-15 DIAGNOSIS — Z90710 Acquired absence of both cervix and uterus: Secondary | ICD-10-CM | POA: Insufficient documentation

## 2013-12-15 DIAGNOSIS — G4733 Obstructive sleep apnea (adult) (pediatric): Secondary | ICD-10-CM | POA: Insufficient documentation

## 2013-12-15 DIAGNOSIS — E119 Type 2 diabetes mellitus without complications: Secondary | ICD-10-CM | POA: Insufficient documentation

## 2013-12-15 DIAGNOSIS — M19049 Primary osteoarthritis, unspecified hand: Secondary | ICD-10-CM | POA: Insufficient documentation

## 2013-12-15 DIAGNOSIS — Z9079 Acquired absence of other genital organ(s): Secondary | ICD-10-CM | POA: Insufficient documentation

## 2013-12-15 DIAGNOSIS — M949 Disorder of cartilage, unspecified: Secondary | ICD-10-CM

## 2013-12-15 HISTORY — DX: Diverticulitis of intestine, part unspecified, without perforation or abscess without bleeding: K57.92

## 2013-12-15 HISTORY — DX: Sleep apnea, unspecified: G47.30

## 2013-12-15 HISTORY — PX: COLONOSCOPY WITH PROPOFOL: SHX5780

## 2013-12-15 LAB — GLUCOSE, CAPILLARY: Glucose-Capillary: 106 mg/dL — ABNORMAL HIGH (ref 70–99)

## 2013-12-15 SURGERY — COLONOSCOPY WITH PROPOFOL
Anesthesia: Monitor Anesthesia Care

## 2013-12-15 MED ORDER — SODIUM CHLORIDE 0.9 % IV SOLN: INTRAVENOUS | Status: DC

## 2013-12-15 MED ORDER — MIDAZOLAM HCL 2 MG/2ML IJ SOLN
INTRAMUSCULAR | Status: AC
  Filled 2013-12-15: qty 2

## 2013-12-15 MED ORDER — LACTATED RINGERS IV SOLN
INTRAVENOUS | Status: DC
  Administered 2013-12-15: 1000 mL via INTRAVENOUS

## 2013-12-15 MED ORDER — PROPOFOL INFUSION 10 MG/ML OPTIME
INTRAVENOUS | Status: DC | PRN
  Administered 2013-12-15: 100 ug/kg/min via INTRAVENOUS

## 2013-12-15 MED ORDER — PROPOFOL 10 MG/ML IV BOLUS
INTRAVENOUS | Status: DC | PRN
  Administered 2013-12-15: 30 mg via INTRAVENOUS

## 2013-12-15 MED ORDER — KETAMINE HCL 10 MG/ML IJ SOLN
INTRAMUSCULAR | Status: DC | PRN
  Administered 2013-12-15 (×2): 10 mg via INTRAVENOUS

## 2013-12-15 MED ORDER — MIDAZOLAM HCL 5 MG/5ML IJ SOLN
INTRAMUSCULAR | Status: DC | PRN
  Administered 2013-12-15: 2 mg via INTRAVENOUS

## 2013-12-15 MED ORDER — PROPOFOL 10 MG/ML IV BOLUS
INTRAVENOUS | Status: AC
  Filled 2013-12-15: qty 60

## 2013-12-15 SURGICAL SUPPLY — 21 items

## 2013-12-15 NOTE — Preoperative (Signed)
Beta Blockers   Reason not to administer Beta Blockers:Not Applicable, not on home BB 

## 2013-12-15 NOTE — H&P (Signed)
  Procedure: Diagnostic colonoscopy following treatment for acute diverticulitis of the sigmoid colon. Normal screening colonoscopy on 01/28/2003  History: The patient is a 78 year old female born 11-18-1935. She underwent a normal screening colonoscopy on 01/28/2003. She underwent a CT scan of the abdomen and pelvis on 10/07/2013 which showed acute diverticulitis of the sigmoid colon. The patient has completed antibiotic therapy and reports no abdominal pain.  The patient is scheduled to undergo a diagnostic colonoscopy following treatment of acute diverticulitis of the sigmoid colon  Allergies: Codeine causes vomiting  Past medical history: Urinary incontinence. Wrist fracture. Hypercholesterolemia. Right middle cerebral artery stenosis. Hypertension. Type 2 diabetes mellitus. Obstructive sleep apnea syndrome. Migraine headache syndrome. Osteoarthritis of the hands. Osteopenia. Left thumb surgery. Tonsillectomy. Total abdominal hysterectomy. Bilateral salpingo-oophorectomy. Myringotomy tubes. Appendectomy. Bladder suspension surgery. Bilateral cataract surgery. Hearing aids.  Exam: The patient is alert and lying comfortably on the endoscopy stretcher. Abdomen is soft and nontender to palpation. Lungs are clear the auscultation. Cardiac exam reveals a regular rhythm.  Plan: Proceed with diagnostic colonoscopy following treatment of acute diverticulitis of the sigmoid colon

## 2013-12-15 NOTE — Anesthesia Postprocedure Evaluation (Signed)
  Anesthesia Post-op Note  Patient: Savannah Harris  Procedure(s) Performed: Procedure(s) (LRB): COLONOSCOPY WITH PROPOFOL (N/A)  Patient Location: PACU  Anesthesia Type: MAC  Level of Consciousness: awake and alert   Airway and Oxygen Therapy: Patient Spontanous Breathing  Post-op Pain: mild  Post-op Assessment: Post-op Vital signs reviewed, Patient's Cardiovascular Status Stable, Respiratory Function Stable, Patent Airway and No signs of Nausea or vomiting  Last Vitals:  Filed Vitals:   12/15/13 1042  BP: 114/74  Pulse:   Temp:   Resp: 15    Post-op Vital Signs: stable   Complications: No apparent anesthesia complications

## 2013-12-15 NOTE — Discharge Instructions (Signed)
Monitored Anesthesia Care  °Monitored anesthesia care is an anesthesia service for a medical procedure. Anesthesia is the loss of the ability to feel pain. It is produced by medications called anesthetics. It may affect a small area of your body (local anesthesia), a large area of your body (regional anesthesia), or your entire body (general anesthesia). The need for monitored anesthesia care depends your procedure, your condition, and the potential need for regional or general anesthesia. It is often provided during procedures where:  °· General anesthesia may be needed if there are complications. This is because you need special care when you are under general anesthesia.   °· You will be under local or regional anesthesia. This is so that you are able to have higher levels of anesthesia if needed.   °· You will receive calming medications (sedatives). This is especially the case if sedatives are given to put you in a semi-conscious state of relaxation (deep sedation). This is because the amount of sedative needed to produce this state can be hard to predict. Too much of a sedative can produce general anesthesia. °Monitored anesthesia care is performed by one or more caregivers who have special training in all types of anesthesia. You will need to meet with these caregivers before your procedure. During this meeting, they will ask you about your medical history. They will also give you instructions to follow. (For example, you will need to stop eating and drinking before your procedure. You may also need to stop or change medications you are taking.) During your procedure, your caregivers will stay with you. They will:  °· Watch your condition. This includes watching you blood pressure, breathing, and level of pain.   °· Diagnose and treat problems that occur.   °· Give medications if they are needed. These may include calming medications (sedatives) and anesthetics.   °· Make sure you are comfortable.   °Having  monitored anesthesia care does not necessarily mean that you will be under anesthesia. It does mean that your caregivers will be able to manage anesthesia if you need it or if it occurs. It also means that you will be able to have a different type of anesthesia than you are having if you need it. When your procedure is complete, your caregivers will continue to watch your condition. They will make sure any medications wear off before you are allowed to go home.  °Document Released: 06/27/2005 Document Revised: 01/26/2013 Document Reviewed: 11/12/2012 °ExitCare® Patient Information ©2014 [employer]. °Colonoscopy °Care After °These instructions give you information on caring for yourself after your procedure. Your doctor may also give you more specific instructions. Call your doctor if you have any problems or questions after your procedure. °HOME CARE °· Take it easy for the next 24 hours. °· Rest. °· Walk or use warm packs on your belly (abdomen) if you have belly cramping or gas. °· Do not drive for 24 hours. °· You may shower. °· Do not sign important papers or use machinery for 24 hours. °· Drink enough fluids to keep your pee (urine) clear or pale yellow. °· Resume your normal diet. Avoid heavy or fried foods. °· Avoid alcohol. °· Continue taking your normal medicines. °· Only take medicine as told by your doctor. Do not take aspirin. °If you had growths (polyps) removed: °· Do not take aspirin. °· Do not drink alcohol for 7 days or as told by your doctor. °· Eat a soft diet for 24 hours. °GET HELP RIGHT AWAY IF: °· You have a fever. °·   You pass clumps of tissue (blood clots) or fill the toilet with blood. °· You have belly pain that gets worse and medicine does not help. °· Your belly is puffy (swollen). °· You feel sick to your stomach (nauseous) or throw up (vomit). °MAKE SURE YOU: °· Understand these instructions. °· Will watch your condition. °· Will get help right away if you are not doing well or get  worse. °Document Released: 11/03/2010 Document Revised: 12/24/2011 Document Reviewed: 06/08/2013 °ExitCare® Patient Information ©2014 [employer]. ° °

## 2013-12-15 NOTE — Anesthesia Preprocedure Evaluation (Signed)
Anesthesia Evaluation  Patient identified by MRN, date of birth, ID band Patient awake    Reviewed: Allergy & Precautions, H&P , NPO status , Patient's Chart, lab work & pertinent test results  Airway Mallampati: II TM Distance: >3 FB Neck ROM: Full    Dental no notable dental hx.    Pulmonary neg pulmonary ROS,  breath sounds clear to auscultation  Pulmonary exam normal       Cardiovascular Exercise Tolerance: Good hypertension, Pt. on medications + Peripheral Vascular Disease negative cardio ROS  Rhythm:Regular Rate:Normal  ECG: NSR, evidence (Low Voltage) consistent with anterior infarct.  No cardiovascular symptoms per patient.    Neuro/Psych  Neuromuscular disease negative psych ROS   GI/Hepatic negative GI ROS, Neg liver ROS, GERD-  Medicated,  Endo/Other  diabetes, Type 2, Oral Hypoglycemic Agents  Renal/GU negative Renal ROS  negative genitourinary   Musculoskeletal negative musculoskeletal ROS (+)   Abdominal   Peds negative pediatric ROS (+)  Hematology negative hematology ROS (+)   Anesthesia Other Findings   Reproductive/Obstetrics negative OB ROS                           Anesthesia Physical  Anesthesia Plan  ASA: III  Anesthesia Plan: MAC   Post-op Pain Management:    Induction: Intravenous  Airway Management Planned: Simple Face Mask  Additional Equipment:   Intra-op Plan:   Post-operative Plan:   Informed Consent: I have reviewed the patients History and Physical, chart, labs and discussed the procedure including the risks, benefits and alternatives for the proposed anesthesia with the patient or authorized representative who has indicated his/her understanding and acceptance.   Dental advisory given  Plan Discussed with: CRNA  Anesthesia Plan Comments:         Anesthesia Quick Evaluation

## 2013-12-15 NOTE — Transfer of Care (Signed)
Immediate Anesthesia Transfer of Care Note  Patient: Savannah Harris  Procedure(s) Performed: Procedure(s): COLONOSCOPY WITH PROPOFOL (N/A)  Patient Location: PACU, ENDO  Anesthesia Type:MAC  Level of Consciousness: Patient easily awoken, sedated, comfortable, cooperative, following commands, responds to stimulation.   Airway & Oxygen Therapy: Patient spontaneously breathing, ventilating well, oxygen via simple oxygen mask.  Post-op Assessment: Report given to PACU RN, vital signs reviewed and stable, moving all extremities.   Post vital signs: Reviewed and stable.  Complications: No apparent anesthesia complications

## 2013-12-15 NOTE — Op Note (Signed)
Procedure: Diagnostic colonoscopy following treatment for acute diverticulitis of the sigmoid colon. Normal screening colonoscopy on 01/28/2003  Endoscopist: Danise EdgeMartin Sugey Trevathan  Premedication: Propofol administered by anesthesia  Procedure: The patient was placed in the left lateral decubitus position. Anal inspection and digital rectal exam were normal. The Pentax pediatric colonoscope was introduced into the rectum and advanced to the cecum. A normal-appearing appendiceal orifice and ileocecal valve were identified. Colonic preparation for the exam today was good.  Rectum. Normal. Retroflexed view of the distal rectum normal  Sigmoid colon and descending colon. Normal  Splenic flexure. Normal  Transverse colon. Normal  Hepatic flexure. Normal  Ascending colon. Normal  Cecum and ileocecal valve. Normal  Assessment: Normal diagnostic proctocolonoscopy to the cecum following treatment of acute diverticulitis of the sigmoid colon identified by CT scan of the abdomen and pelvis on 10/08/1999

## 2013-12-16 ENCOUNTER — Encounter (HOSPITAL_COMMUNITY): Payer: Self-pay | Admitting: Gastroenterology

## 2014-02-25 ENCOUNTER — Other Ambulatory Visit: Payer: Self-pay

## 2014-02-25 DIAGNOSIS — Z1231 Encounter for screening mammogram for malignant neoplasm of breast: Secondary | ICD-10-CM

## 2014-03-10 ENCOUNTER — Encounter (INDEPENDENT_AMBULATORY_CARE_PROVIDER_SITE_OTHER): Payer: Self-pay

## 2014-03-10 ENCOUNTER — Ambulatory Visit
Admission: RE | Admit: 2014-03-10 | Discharge: 2014-03-10 | Disposition: A | Discharge status: Home / Self Care | Payer: Medicare Other | Source: Ambulatory Visit

## 2014-03-10 DIAGNOSIS — Z1231 Encounter for screening mammogram for malignant neoplasm of breast: Secondary | ICD-10-CM

## 2014-10-20 ENCOUNTER — Ambulatory Visit (INDEPENDENT_AMBULATORY_CARE_PROVIDER_SITE_OTHER): Payer: Medicare Other | Admitting: Neurology

## 2014-10-20 ENCOUNTER — Encounter: Payer: Self-pay | Admitting: Neurology

## 2014-10-20 ENCOUNTER — Telehealth: Payer: Self-pay | Admitting: Family Medicine

## 2014-10-20 VITALS — BP 128/68 | HR 69 | Resp 16 | Ht 61.0 in | Wt 149.0 lb

## 2014-10-20 DIAGNOSIS — R413 Other amnesia: Secondary | ICD-10-CM

## 2014-10-20 DIAGNOSIS — E118 Type 2 diabetes mellitus with unspecified complications: Secondary | ICD-10-CM

## 2014-10-20 DIAGNOSIS — I10 Essential (primary) hypertension: Secondary | ICD-10-CM

## 2014-10-20 DIAGNOSIS — E785 Hyperlipidemia, unspecified: Secondary | ICD-10-CM

## 2014-10-20 LAB — VITAMIN B12: Vitamin B-12: 383 pg/mL (ref 211–911)

## 2014-10-20 LAB — TSH: TSH: 2.93 u[IU]/mL (ref 0.350–4.500)

## 2014-10-20 NOTE — Telephone Encounter (Signed)
She called Urologist. She can't have MRI. CT is ok.

## 2014-10-20 NOTE — Telephone Encounter (Signed)
Called patient to let her know about CT Head appt. Appt scheduled for 10/25/14 @ [facility] arrive @ 3:15.

## 2014-10-20 NOTE — Progress Notes (Signed)
NEUROLOGY CONSULTATION NOTE  Savannah Harris MRN: [id number] DOB: [birth year]  Referring provider: Dr. Kristie Cowman Primary care provider: Dr. Kristie Cowman  Reason for consult:  Memory loss  Dear Dr Bosie Clos:  Thank you for your kind referral of Savannah Harris for consultation of the above symptoms. Although her history is well known to you, please allow me to reiterate it for the purpose of our medical record. The patient was accompanied to the clinic by her husband who also provides collateral information. Records and images were personally reviewed where available.  HISTORY OF PRESENT ILLNESS: This is a pleasant 79 year old right-handed woman with a history of hypertension, hyperlipidemia, diabetes, OSA, right MCA stenosis, osteoarthritis, and migraines, presenting for evaluation of memory loss that she and her husband started noticing for several years, however worsened over the past 6-12 months. She reports she cannot find the words she wants to say. She cannot recall small things from the day before, or what she went to get in another room. She got lost twice while driving, missing her exit in the past month. She occasionally forgets to take her afternoon medication. She denies any missed bill payments. She reports being good with multitasking. Her husband reports that he would have to repeat several things to her, they are constantly looking for something. She would buy something and cannot find where she put it. She would need instructions on how to get to places she has been going to for years. She has difficulties grasping what is being said, with difficulty processing. She has left the stove on to do something else. Today, she could not turn the computer on because she could not recall that she had to put a password in. She has gotten more short with her husband, no paranoia.   She has had migraines for many years, occurring once a week on average. Most of the time she has more  of a tension headache, no nausea, vomiting, some photophobia and blurred vision. She will be taken off gabapentin next month (started for "nerve in my head"). She denies any persistent dizziness, diplopia, dysarthria, dysphagia, focal numbness/tingling/weakness. No tremors or anosmia. She has an implant for incontinence. She has some neck pain radiating up her head, different from her migraines. Her mother was diagnosed with Alzheimer's disease in her late 60s. She had a concussion skiing a few years ago, no loss of consciousness or skull fractures. She had seen a neurologist in [facility] 5=6 years ago for "mini-strokes" and recalls 2 episodes of left arm numbness. She has carotid artery aneurysms. Her husband reports she has been very anxious that something is going on with her. She is active and exercises, [facility] [employer] [facility] [employer] [facility].   Laboratory Data: 07/2014 HbA1c 7.4  PAST MEDICAL HISTORY: Past Medical History  Diagnosis Date  . Hypertension   . Hyperlipidemia   . Leg cramps     OCCASIONAL  . Type 2 diabetes mellitus   . Urge urinary incontinence   . GERD (gastroesophageal reflux disease)   . Arthritis     WRIST/ HANDS  . Asymptomatic vertebral artery stenosis     RIGHT SIDE  . Diverticulitis 11-30-13    during Christmas holiday- resolved  . Sleep apnea 11-30-13    mild-no cpap.    PAST SURGICAL HISTORY: Past Surgical History  Procedure Laterality Date  . Total hip arthroplasty Right 12-16-2008  . Pubovaginal sling  08-26-2007    SPARC SLING  .  Total abdominal hysterectomy w/ bilateral salpingoophorectomy  1970s    AND APPENDECTOMY  . Tonsillectomy and adenoidectomy  AS CHILD  . Left wrsit tendon repair  1999  . Cataract extraction w/ intraocular lens  implant, bilateral    . Interstim implant placement N/A 07/28/2013    Procedure: Leane PlattINTERSTIM IMPLANT FIRST STAGE;  Surgeon: Martina SinnerScott A MacDiarmid, MD;  [facility];  Service: Urology;   Laterality: N/A;  . Interstim implant placement N/A 07/28/2013    Procedure: INTERSTIM IMPLANT SECOND STAGE;  Surgeon: Martina SinnerScott A MacDiarmid, MD;  [facility];  Service: Urology;  Laterality: N/A;  . Colonoscopy with propofol N/A 12/15/2013    Procedure: COLONOSCOPY WITH PROPOFOL;  Surgeon: Charolett BumpersMartin K Johnson, MD;  Location: [facility];  Service: Endoscopy;  Laterality: N/A;    MEDICATIONS: Current Outpatient Prescriptions on File Prior to Visit  Medication Sig Dispense Refill  . aspirin EC 81 MG tablet Take 81 mg by mouth at bedtime.    Marland Kitchen. atorvastatin (LIPITOR) 20 MG tablet Take 20 mg by mouth every morning.    . Cholecalciferol (VITAMIN D3) 5000 UNITS CAPS Take 5,000 Units by mouth daily.     . cycloSPORINE (RESTASIS) 0.05 % ophthalmic emulsion Place 1 drop into both eyes 2 (two) times daily. May do addition dose    . glimepiride (AMARYL) 2 MG tablet Take 2 mg by mouth daily before breakfast.    . lisinopril-hydrochlorothiazide (PRINZIDE,ZESTORETIC) 20-25 MG per tablet Take 1 tablet by mouth every morning.     . metFORMIN (GLUCOPHAGE-XR) 500 MG 24 hr tablet Take 1,000 mg by mouth 2 (two) times daily.    Marland Kitchen. omeprazole (PRILOSEC) 20 MG capsule Take 20 mg by mouth daily as needed (heart burn).     . Polyethylene Glycol 3350 (MIRALAX PO) Take 17 g by mouth daily.      No current facility-administered medications on file prior to visit.    ALLERGIES: Allergies  Allergen Reactions  . Adhesive [Tape] Other (See Comments)    BLISTERS  . Codeine Nausea And Vomiting  . Latex Other (See Comments)    BLISTERS    FAMILY HISTORY: Family History  Problem Relation Age of Onset  . Diabetes Mother   . Alzheimer's disease Mother     SOCIAL HISTORY: History   Social History  . Marital Status: Married    Spouse Name: N/A    Number of Children: N/A  . Years of Education: N/A   Occupational History  . Not on file.   Social History Main Topics  . Smoking status:  Never Smoker   . Smokeless tobacco: Never Used  . Alcohol Use: No  . Drug Use: No  . Sexual Activity: Not Currently   Other Topics Concern  . Not on file   Social History Narrative    REVIEW OF SYSTEMS: Constitutional: No fevers, chills, or sweats, no generalized fatigue, change in appetite Eyes: No visual changes, double vision, eye pain Ear, nose and throat: No hearing loss, ear pain, nasal congestion, sore throat Cardiovascular: No chest pain, palpitations Respiratory:  No shortness of breath at rest or with exertion, wheezes GastrointestinaI: No nausea, vomiting, diarrhea, abdominal pain, fecal incontinence Genitourinary:  No dysuria, urinary retention or frequency Musculoskeletal:  + neck pain, back pain Integumentary: No rash, pruritus, skin lesions Neurological: as above Psychiatric: No depression, insomnia, +anxiety Endocrine: No palpitations, fatigue, diaphoresis, mood swings, change in appetite, change in weight, increased thirst Hematologic/Lymphatic:  No anemia, purpura, petechiae. Allergic/Immunologic: no  itchy/runny eyes, nasal congestion, recent allergic reactions, rashes  PHYSICAL EXAM: Filed Vitals:   10/20/14 1035  BP: 128/68  Pulse: 69  Resp: 16   General: No acute distress Head:  Normocephalic/atraumatic Eyes: Fundoscopic exam shows bilateral sharp discs, no vessel changes, exudates, or hemorrhages Neck: supple, no paraspinal tenderness, full range of motion Back: No paraspinal tenderness Heart: regular rate and rhythm Lungs: Clear to auscultation bilaterally. Vascular: No carotid bruits. Skin/Extremities: No rash, no edema Neurological Exam: Mental status: alert and oriented to person, place, and time, no dysarthria or aphasia, Fund of knowledge is appropriate.  Recent and remote memory are intact.  Attention and concentration are normal.    Able to name objects and repeat phrases. Montreal Cognitive Assessment  10/20/2014  Visuospatial/ Executive  (0/5) 5  Naming (0/3) 3  Attention: Read list of digits (0/2) 2  Attention: Read list of letters (0/1) 1  Attention: Serial 7 subtraction starting at 100 (0/3) 3  Language: Repeat phrase (0/2) 2  Language : Fluency (0/1) 1  Abstraction (0/2) 2  Delayed Recall (0/5) 5  Orientation (0/6) 6  Total 30  Adjusted Score (based on education) 30   Cranial nerves: CN I: not tested CN II: pupils equal, round and reactive to light, visual fields intact, fundi unremarkable. CN III, IV, VI:  full range of motion, no nystagmus, no ptosis CN V: facial sensation intact CN VII: upper and lower face symmetric CN VIII: hearing intact to finger rub CN IX, X: gag intact, uvula midline CN XI: sternocleidomastoid and trapezius muscles intact CN XII: tongue midline Bulk & Tone: normal, no fasciculations. Motor: 5/5 throughout with no pronator drift. Sensation: decreased pin on the left UE and LE, otherwise intact to cold, vibration and joint position sense.  Romberg test negative Deep Tendon Reflexes: asymmetry noted with more brisk reflexes on the left UE and LE, +2 bilateral ankle jerks, no ankle clonus, negative Hoffman sign Plantar responses: downgoing bilaterally Cerebellar: no incoordination on finger to nose, heel to shin. No dysdiadochokinesia Gait: narrow-based and steady, unable to tandem walk Tremor: none  IMPRESSION: This is a pleasant 79 year old right-handed woman with hypertension, hyperlipidemia, diabetes, migraines, with worsening memory loss over the past 6-12 months. She has become very anxious that something is wrong with her. Her MOCA today is normal at 30/30. Exam shows decreased sensation and brisk reflexes on the left. We discussed different etiologies of memory loss, no evidence of dementia today. Check B12 and TSH. She is unable to do an MRI, head CT without contrast will be ordered to assess for underlying structural abnormality. We discussed pseudodementia and effects of anxiety  on memory. We discussed the importance of control of vascular risk factors, physical exercise, and brain stimulation exercises for brain health. She will follow-up in 6 months.  Thank you for allowing me to participate in the care of this patient. Please do not hesitate to call for any questions or concerns.   Savannah Harris, M.D.  CC: Dr. Bosie Clos

## 2014-10-20 NOTE — Telephone Encounter (Signed)
Let's do CT head without contrast for memory loss. Thanks

## 2014-10-20 NOTE — Patient Instructions (Signed)
1. Bloodwork for TSH, B12 2. Please let our office know if your stimulator is MRI compatible for an MRI brain.  3. Continue with control of blood pressure, cholesterol, diabetes for brain health 4. Continue physical exercise and brain stimulation exercises 5. Follow-up in 6 months

## 2014-10-23 ENCOUNTER — Encounter: Payer: Self-pay | Admitting: Neurology

## 2014-10-23 DIAGNOSIS — E118 Type 2 diabetes mellitus with unspecified complications: Secondary | ICD-10-CM | POA: Insufficient documentation

## 2014-10-23 DIAGNOSIS — R413 Other amnesia: Secondary | ICD-10-CM | POA: Insufficient documentation

## 2014-10-23 DIAGNOSIS — I10 Essential (primary) hypertension: Secondary | ICD-10-CM | POA: Insufficient documentation

## 2014-10-23 DIAGNOSIS — E785 Hyperlipidemia, unspecified: Secondary | ICD-10-CM | POA: Insufficient documentation

## 2014-10-25 ENCOUNTER — Encounter (HOSPITAL_COMMUNITY): Payer: Self-pay

## 2014-10-25 ENCOUNTER — Ambulatory Visit (HOSPITAL_COMMUNITY)
Admission: RE | Admit: 2014-10-25 | Discharge: 2014-10-25 | Disposition: A | Discharge status: Home / Self Care | Payer: Medicare Other | Source: Ambulatory Visit | Attending: Neurology | Admitting: Neurology

## 2014-10-25 DIAGNOSIS — I679 Cerebrovascular disease, unspecified: Secondary | ICD-10-CM | POA: Insufficient documentation

## 2014-10-25 DIAGNOSIS — I10 Essential (primary) hypertension: Secondary | ICD-10-CM | POA: Insufficient documentation

## 2014-10-25 DIAGNOSIS — R413 Other amnesia: Secondary | ICD-10-CM | POA: Insufficient documentation

## 2014-10-25 DIAGNOSIS — G319 Degenerative disease of nervous system, unspecified: Secondary | ICD-10-CM | POA: Insufficient documentation

## 2014-10-25 DIAGNOSIS — E119 Type 2 diabetes mellitus without complications: Secondary | ICD-10-CM | POA: Insufficient documentation

## 2014-10-25 DIAGNOSIS — Z8673 Personal history of transient ischemic attack (TIA), and cerebral infarction without residual deficits: Secondary | ICD-10-CM | POA: Insufficient documentation

## 2014-10-25 NOTE — Progress Notes (Signed)
Done

## 2015-04-11 ENCOUNTER — Ambulatory Visit (INDEPENDENT_AMBULATORY_CARE_PROVIDER_SITE_OTHER): Payer: Medicare Other | Admitting: Neurology

## 2015-04-11 ENCOUNTER — Encounter: Payer: Self-pay | Admitting: Neurology

## 2015-04-11 VITALS — BP 110/72 | HR 85 | Resp 16 | Ht 61.0 in | Wt 148.0 lb

## 2015-04-11 DIAGNOSIS — R413 Other amnesia: Secondary | ICD-10-CM

## 2015-04-11 NOTE — Progress Notes (Signed)
NEUROLOGY FOLLOW UP OFFICE NOTE  Savannah Harris [id number]  HISTORY OF PRESENT ILLNESS: I had the pleasure of seeing Savannah Harris in follow-up in the neurology clinic on 04/11/2015.  The patient was last seen 5 months ago for concerns of memory loss. Her MOCA score was 30/30. Records and images were personally reviewed where available.  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 TSH and B12 normal. Since her initial visit, she is not sure if there has been any significant change in her memory. She reports her biggest thing is she cannot remember to check her glucose level 2 hours after lunch and dinner. She denies any missed medications, missed bill payments, she does not get lost driving. She has been active with [employer] and likes going out with her friends. She fell off a ladder 2 weeks ago and continues to have right hip and leg pain. No significant head injury. She denies any headaches, persistent dizziness, diplopia, dysarthria, dysphagia, focal numbness/tingling/weakness.   HPI: This is a pleasant 79 yo RH woman with a history of hypertension, hyperlipidemia, diabetes, OSA, right MCA stenosis, osteoarthritis, and migraines, who presented fro evaluation of memory loss that she and her husband started noticing for several years, however worsened in 2015. She reports she cannot find the words she wants to say. She cannot recall small things from the day before, or what she went to get in another room. She got lost twice while driving, missing her exit in the past month. She occasionally forgets to take her afternoon medication. She denies any missed bill payments. She reports being good with multitasking. Her husband reports that he would have to repeat several things to her, they are constantly looking for something. She would  buy something and cannot find where she put it. She would need instructions on how to get to places she has been going to for years. She has difficulties grasping what is being said, with difficulty processing. She has left the stove on to do something else. Today, she could not turn the computer on because she could not recall that she had to put a password in. She has gotten more short with her husband, no paranoia.   She has an implant for incontinence. Her mother was diagnosed with Alzheimer's disease in her late 5s. She had a concussion skiing a few years ago, no loss of consciousness or skull fractures. She had seen a neurologist in [facility] 5=6 years ago for "mini-strokes" and recalls 2 episodes of left arm numbness. She has carotid artery aneurysms.    PAST MEDICAL HISTORY: Past Medical History  Diagnosis Date  . Hypertension   . Hyperlipidemia   . Leg cramps     OCCASIONAL  . Type 2 diabetes mellitus   . Urge urinary incontinence   . GERD (gastroesophageal reflux disease)   . Arthritis     WRIST/ HANDS  . Asymptomatic vertebral artery stenosis     RIGHT SIDE  . Diverticulitis 11-30-13    during Christmas holiday- resolved  . Sleep apnea 11-30-13    mild-no cpap.    MEDICATIONS: Current Outpatient Prescriptions on File Prior to Visit  Medication Sig Dispense Refill  . aspirin EC 81 MG tablet Take 81 mg by mouth at bedtime.    . Cholecalciferol (VITAMIN D3) 5000 UNITS CAPS Take 5,000 Units by mouth  daily.     . cycloSPORINE (RESTASIS) 0.05 % ophthalmic emulsion Place 1 drop into both eyes 2 (two) times daily. May do addition dose    . lisinopril-hydrochlorothiazide (PRINZIDE,ZESTORETIC) 20-25 MG per tablet Take 1 tablet by mouth every morning.     . metFORMIN (GLUCOPHAGE-XR) 500 MG 24 hr tablet Take 1,000 mg by mouth 2 (two) times daily.    Marland Kitchen. omeprazole (PRILOSEC) 20 MG capsule Take 20 mg by mouth daily as needed (heart burn).     . Polyethylene Glycol 3350 (MIRALAX PO) Take  17 g by mouth daily.      No current facility-administered medications on file prior to visit.    ALLERGIES: Allergies  Allergen Reactions  . Adhesive [Tape] Other (See Comments)    BLISTERS  . Codeine Nausea And Vomiting  . Latex Other (See Comments)    BLISTERS    FAMILY HISTORY: Family History  Problem Relation Age of Onset  . Diabetes Mother   . Alzheimer's disease Mother     SOCIAL HISTORY: History   Social History  . Marital Status: Married    Spouse Name: N/A  . Number of Children: N/A  . Years of Education: N/A   Occupational History  . Not on file.   Social History Main Topics  . Smoking status: Never Smoker   . Smokeless tobacco: Never Used  . Alcohol Use: No  . Drug Use: No  . Sexual Activity: Not Currently   Other Topics Concern  . Not on file   Social History Narrative    REVIEW OF SYSTEMS: Constitutional: No fevers, chills, or sweats, no generalized fatigue, change in appetite Eyes: No visual changes, double vision, eye pain Ear, nose and throat: No hearing loss, ear pain, nasal congestion, sore throat Cardiovascular: No chest pain, palpitations Respiratory:  No shortness of breath at rest or with exertion, wheezes GastrointestinaI: No nausea, vomiting, diarrhea, abdominal pain, fecal incontinence Genitourinary:  No dysuria, urinary retention or frequency Musculoskeletal:  No neck pain, back pain Integumentary: No rash, pruritus, skin lesions Neurological: as above Psychiatric: No depression, insomnia, anxiety Endocrine: No palpitations, fatigue, diaphoresis, mood swings, change in appetite, change in weight, increased thirst Hematologic/Lymphatic:  No anemia, purpura, petechiae. Allergic/Immunologic: no itchy/runny eyes, nasal congestion, recent allergic reactions, rashes  PHYSICAL EXAM: Filed Vitals:   04/11/15 1415  BP: 110/72  Pulse: 85  Resp: 16   General: No acute distress Head:  Normocephalic/atraumatic Neck: supple, no  paraspinal tenderness, full range of motion Heart:  Regular rate and rhythm Lungs:  Clear to auscultation bilaterally Back: No paraspinal tenderness Skin/Extremities: No rash, no edema Neurological Exam: alert and oriented to person, place, and time. No aphasia or dysarthria. Fund of knowledge is appropriate.  Recent and remote memory are intact. 2/3 delayed recall. Attention and concentration are normal.    Able to name objects and repeat phrases. Cranial nerves: Pupils equal, round, reactive to light.  Fundoscopic exam unremarkable, no papilledema. Extraocular movements intact with no nystagmus. Visual fields full. Facial sensation intact. No facial asymmetry. Tongue, uvula, palate midline.  Motor: Bulk and tone normal, muscle strength 5/5 throughout with no pronator drift.  Sensation to light touch intact.  No extinction to double simultaneous stimulation.  Deep tendon reflexes 2+ throughout, toes downgoing.  Finger to nose testing intact.  Gait narrow-based and steady, able to tandem walk adequately.  Romberg negative.  IMPRESSION: This is a pleasant 79 yo RH woman with hypertension, hyperlipidemia, diabetes, migraines, with worsening memory loss. She had  become very anxious that something is wrong with her. Her MOCA today in January 2016 was 30/30. Head CT did not show any acute changes, TSH and B12 normal. Findings were discussed with the patient, symptoms suggestive of age-related memory changes, possible mild cognitive impairment. No indication to start cholinesterase inhibitors at this time. We again discussed pseudodementia and effects of anxiety on memory. We also again discussed the importance of control of vascular risk factors, physical exercise, and brain stimulation exercises for brain health. She will follow-up in 1 year or earlier if needed.   Thank you for allowing me to participate in her care.  Please do not hesitate to call for any questions or concerns.  The duration of this  appointment visit was 15 minutes of face-to-face time with the patient.  Greater than 50% of this time was spent in counseling, explanation of diagnosis, planning of further management, and coordination of care.   Patrcia Dolly, M.D.   CC: Dr. Bosie Clos

## 2015-04-11 NOTE — Patient Instructions (Signed)
1. Continue control of BP, cholesterol, diabetes; as well as physical exercise and brain stimulation exercises for brain health 2. Follow-up in 1 year, call our office for any changes

## 2015-04-15 ENCOUNTER — Other Ambulatory Visit: Payer: Self-pay

## 2015-04-15 DIAGNOSIS — Z1231 Encounter for screening mammogram for malignant neoplasm of breast: Secondary | ICD-10-CM

## 2015-05-19 ENCOUNTER — Ambulatory Visit
Admission: RE | Admit: 2015-05-19 | Discharge: 2015-05-19 | Disposition: A | Discharge status: Home / Self Care | Payer: Medicare Other | Source: Ambulatory Visit

## 2015-05-19 DIAGNOSIS — Z1231 Encounter for screening mammogram for malignant neoplasm of breast: Secondary | ICD-10-CM

## 2015-12-02 ENCOUNTER — Encounter: Payer: Self-pay | Admitting: Neurology

## 2016-01-10 ENCOUNTER — Ambulatory Visit (HOSPITAL_BASED_OUTPATIENT_CLINIC_OR_DEPARTMENT_OTHER): Admit: 2016-01-10 | Payer: Medicare Other | Admitting: Urology

## 2016-01-10 ENCOUNTER — Encounter (HOSPITAL_BASED_OUTPATIENT_CLINIC_OR_DEPARTMENT_OTHER): Payer: Self-pay

## 2016-01-10 SURGERY — INSERTION, SACRAL NERVE STIMULATOR, INTERSTIM, STAGE 1
Anesthesia: General

## 2016-03-15 ENCOUNTER — Encounter: Payer: Self-pay | Admitting: Family Medicine

## 2016-04-10 ENCOUNTER — Ambulatory Visit: Payer: Medicare Other | Admitting: Neurology

## 2016-05-03 ENCOUNTER — Other Ambulatory Visit: Payer: Self-pay | Admitting: Neurosurgery

## 2016-05-03 DIAGNOSIS — M5416 Radiculopathy, lumbar region: Secondary | ICD-10-CM

## 2016-05-09 ENCOUNTER — Ambulatory Visit
Admission: RE | Admit: 2016-05-09 | Discharge: 2016-05-09 | Disposition: A | Discharge status: Home / Self Care | Payer: Medicare Other | Source: Ambulatory Visit | Attending: Neurosurgery | Admitting: Neurosurgery

## 2016-05-09 DIAGNOSIS — M5416 Radiculopathy, lumbar region: Secondary | ICD-10-CM

## 2016-05-09 MED ORDER — IOPAMIDOL (ISOVUE-M 200) INJECTION 41%
15.0000 mL | Freq: Once | INTRAMUSCULAR | Status: AC
  Administered 2016-05-09: 15 mL via INTRATHECAL

## 2016-05-09 MED ORDER — ONDANSETRON HCL 4 MG/2ML IJ SOLN: 4.0000 mg | Freq: Four times a day (QID) | INTRAMUSCULAR | Status: DC | PRN

## 2016-05-09 MED ORDER — DIAZEPAM 5 MG PO TABS: 5.0000 mg | ORAL_TABLET | Freq: Once | ORAL | Status: DC

## 2016-05-09 NOTE — Discharge Instructions (Signed)

## 2016-06-11 ENCOUNTER — Other Ambulatory Visit: Payer: Self-pay | Admitting: Family Medicine

## 2016-06-11 DIAGNOSIS — Z1231 Encounter for screening mammogram for malignant neoplasm of breast: Secondary | ICD-10-CM

## 2016-07-05 ENCOUNTER — Other Ambulatory Visit: Payer: Self-pay | Admitting: Family Medicine

## 2016-07-05 DIAGNOSIS — E2839 Other primary ovarian failure: Secondary | ICD-10-CM

## 2016-07-13 ENCOUNTER — Ambulatory Visit
Admission: RE | Admit: 2016-07-13 | Discharge: 2016-07-13 | Disposition: A | Discharge status: Home / Self Care | Payer: Medicare Other | Source: Ambulatory Visit | Attending: Family Medicine | Admitting: Family Medicine

## 2016-07-13 DIAGNOSIS — E2839 Other primary ovarian failure: Secondary | ICD-10-CM

## 2016-07-13 DIAGNOSIS — Z1231 Encounter for screening mammogram for malignant neoplasm of breast: Secondary | ICD-10-CM

## 2016-08-01 ENCOUNTER — Ambulatory Visit (INDEPENDENT_AMBULATORY_CARE_PROVIDER_SITE_OTHER): Payer: Medicare Other | Admitting: Neurology

## 2016-08-01 ENCOUNTER — Encounter: Payer: Self-pay | Admitting: Neurology

## 2016-08-01 ENCOUNTER — Ambulatory Visit: Payer: Medicare Other | Admitting: Neurology

## 2016-08-01 VITALS — BP 118/64 | HR 73 | Ht 61.0 in | Wt 144.4 lb

## 2016-08-01 DIAGNOSIS — R413 Other amnesia: Secondary | ICD-10-CM

## 2016-08-01 DIAGNOSIS — F411 Generalized anxiety disorder: Secondary | ICD-10-CM

## 2016-08-01 MED ORDER — PAROXETINE HCL 10 MG PO TABS: 10.0000 mg | ORAL_TABLET | Freq: Every day | ORAL | 3 refills | Status: DC

## 2016-08-01 NOTE — Patient Instructions (Addendum)
1. Start Paxil 10mg  daily 2. Control of blood pressure, cholesterol, as well as physical exercise and brain stimulation exercises are important for brain health 3. Follow-up in 6 months, call for any changes

## 2016-08-01 NOTE — Progress Notes (Signed)
NEUROLOGY FOLLOW UP OFFICE NOTE  Savannah Harris [id number]  HISTORY OF PRESENT ILLNESS: I had the pleasure of seeing Savannah Harris in follow-up in the neurology clinic on 08/01/2016.  The patient was last seen more than a year ago for memory loss. Her MOCA score in January 2016 was 30/30. Since her last visit, she feels her short-term memory is worse. She was coming back from [place] and driving to meet her husband for dinner but could not find the way. She could not follow directions given by a bystander. She had to call her husband to give her street by street instructions. She denies any missed bill payments or missed medications. She misplaces things on a daily basis, particularly her cellphone and glasses. One time she left her car keys in the fridge. Her husband reports a little more difficulty with comprehension, such as when she is watching something or given instructions. No paranoia or hallucinations, no difficulties with ADLs. She has infrequent headaches. She gets dizzy when making quick turns after standing. No falls in the past year. She gets 3 hours of sleep because she cannot stop thinking of things. She reported stress with her sister when she visited her in [place]. Her husband reports that after she was reassured on her last visit about her memory, she improved cognitively, but recently has started to have issues again.   HPI: This is a pleasant 80 yo RH woman with a history of hypertension, hyperlipidemia, diabetes, OSA, right MCA stenosis, osteoarthritis, and migraines, who presented fro evaluation of memory loss that she and her husband started noticing for several years, however worsened in 2015. She reports she cannot find the words she wants to say. She cannot recall small things from the day before, or what she went to get in another room. She got lost twice while driving, missing her exit in the past month. She occasionally forgets to take her afternoon medication. She  denies any missed bill payments. She reports being good with multitasking. Her husband reports that he would have to repeat several things to her, they are constantly looking for something. She would buy something and cannot find where she put it. She would need instructions on how to get to places she has been going to for years. She has difficulties grasping what is being said, with difficulty processing. She has left the stove on to do something else. Today, she could not turn the computer on because she could not recall that she had to put a password in. She has gotten more short with her husband, no paranoia.   She has an implant for incontinence. Her mother was diagnosed with Alzheimer's disease in her late 28s. She had a concussion skiing a few years ago, no loss of consciousness or skull fractures. She had seen a neurologist in [facility] 5=6 years ago for "mini-strokes" and recalls 2 episodes of left arm numbness. She has carotid artery aneurysms.   Diagnostic Data: Records and images were personally reviewed where available.  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 TSH and B12 normal.   PAST MEDICAL HISTORY: Past Medical History:  Diagnosis Date  . Arthritis    WRIST/ HANDS  . Asymptomatic vertebral artery stenosis    RIGHT SIDE  . Diverticulitis 11-30-13   during Christmas holiday- resolved  . GERD (gastroesophageal reflux disease)   .  Hyperlipidemia   . Hypertension   . Leg cramps    OCCASIONAL  . Sleep apnea 11-30-13   mild-no cpap.  . Type 2 diabetes mellitus (HCC)   . Urge urinary incontinence     MEDICATIONS: Current Outpatient Prescriptions on File Prior to Visit  Medication Sig Dispense Refill  . Ascorbic Acid (VITAMIN C) 1000 MG tablet Take 1,000 mg by mouth daily.    Marland Kitchen. aspirin EC 81 MG tablet Take 81 mg  by mouth at bedtime.    . Cholecalciferol (VITAMIN D3) 5000 UNITS CAPS Take 5,000 Units by mouth daily.     . cycloSPORINE (RESTASIS) 0.05 % ophthalmic emulsion Place 1 drop into both eyes 2 (two) times daily. May do addition dose    . glimepiride (AMARYL) 4 MG tablet Take 4 mg by mouth daily with breakfast.    . lisinopril-hydrochlorothiazide (PRINZIDE,ZESTORETIC) 20-25 MG per tablet Take 1 tablet by mouth every morning.     . metFORMIN (GLUCOPHAGE-XR) 500 MG 24 hr tablet Take 1,000 mg by mouth 2 (two) times daily.    Marland Kitchen. omeprazole (PRILOSEC) 20 MG capsule Take 20 mg by mouth daily as needed (heart burn).     . Polyethylene Glycol 3350 (MIRALAX PO) Take 17 g by mouth daily.      No current facility-administered medications on file prior to visit.     ALLERGIES: Allergies  Allergen Reactions  . Adhesive [Tape] Other (See Comments)    BLISTERS  . Codeine Nausea And Vomiting  . Latex Other (See Comments)    BLISTERS    FAMILY HISTORY: Family History  Problem Relation Age of Onset  . Diabetes Mother   . Alzheimer's disease Mother     SOCIAL HISTORY: Social History   Social History  . Marital status: Married    Spouse name: N/A  . Number of children: N/A  . Years of education: N/A   Occupational History  . Not on file.   Social History Main Topics  . Smoking status: Never Smoker  . Smokeless tobacco: Never Used  . Alcohol use No  . Drug use: No  . Sexual activity: Not Currently   Other Topics Concern  . Not on file   Social History Narrative  . No narrative on file    REVIEW OF SYSTEMS: Constitutional: No fevers, chills, or sweats, no generalized fatigue, change in appetite Eyes: No visual changes, double vision, eye pain Ear, nose and throat: No hearing loss, ear pain, nasal congestion, sore throat Cardiovascular: No chest pain, palpitations Respiratory:  No shortness of breath at rest or with exertion, wheezes GastrointestinaI: No nausea, vomiting, diarrhea,  abdominal pain, fecal incontinence Genitourinary:  No dysuria, urinary retention or frequency Musculoskeletal:  No neck pain, back pain Integumentary: No rash, pruritus, skin lesions Neurological: as above Psychiatric: No depression,+ insomnia, anxiety Endocrine: No palpitations, fatigue, diaphoresis, mood swings, change in appetite, change in weight, increased thirst Hematologic/Lymphatic:  No anemia, purpura, petechiae. Allergic/Immunologic: no itchy/runny eyes, nasal congestion, recent allergic reactions, rashes  PHYSICAL EXAM: Vitals:   08/01/16 1058  BP: 118/64  Pulse: 73   General: No acute distress Head:  Normocephalic/atraumatic Neck: supple, no paraspinal tenderness, full range of motion Heart:  Regular rate and rhythm Lungs:  Clear to auscultation bilaterally Back: No paraspinal tenderness Skin/Extremities: No rash, no edema Neurological Exam: alert and oriented to person, place, and time. No aphasia or dysarthria. Fund of knowledge is appropriate.  Recent and remote memory are intact. Attention and concentration are normal.  Able to name objects and repeat phrases.  Montreal Cognitive Assessment  08/01/2016 10/20/2014  Visuospatial/ Executive (0/5) 5 5  Naming (0/3) 3 3  Attention: Read list of digits (0/2) 2 2  Attention: Read list of letters (0/1) 1 1  Attention: Serial 7 subtraction starting at 100 (0/3) 3 3  Language: Repeat phrase (0/2) 2 2  Language : Fluency (0/1) 1 1  Abstraction (0/2) 2 2  Delayed Recall (0/5) 4 5  Orientation (0/6) 6 6  Total 29 30  Adjusted Score (based on education) - 30   Cranial nerves: Pupils equal, round, reactive to light. Extraocular movements intact with no nystagmus. Visual fields full. Facial sensation intact. No facial asymmetry. Tongue, uvula, palate midline.  Motor: Bulk and tone normal, muscle strength 5/5 throughout with no pronator drift.  Sensation to light touch intact.  No extinction to double simultaneous stimulation.   Deep tendon reflexes 2+ throughout, toes downgoing.  Finger to nose testing intact.  Gait narrow-based and steady, able to tandem walk adequately.  Romberg negative.  IMPRESSION: This is a pleasant 80 yo RH woman with hypertension, hyperlipidemia, diabetes, migraines, with worsening memory loss. Her MOCA score today is normal 29/30 (30/30 in January 2016). We again discussed different causes of memory change, she endorsed a lot of anxiety with her sister, which causes poor sleep at night. We discussed starting an SSRI, she is agreeable and will start Paxil 10mg  daily. Side effects were discussed. No clear indication for cholinesterase inhibitors at this time, continue to monitor. We also again discussed the importance of control of vascular risk factors, physical exercise, and brain stimulation exercises for brain health. She will follow-up in 6 months or earlier if needed.   Thank you for allowing me to participate in her care.  Please do not hesitate to call for any questions or concerns.  The duration of this appointment visit was 25 minutes of face-to-face time with the patient.  Greater than 50% of this time was spent in counseling, explanation of diagnosis, planning of further management, and coordination of care.   Savannah Harris, M.D.   CC: Dr. Cyndia Bent

## 2016-08-10 ENCOUNTER — Telehealth: Payer: Self-pay | Admitting: Neurology

## 2016-08-10 NOTE — Telephone Encounter (Signed)
Agree with cutting pill in half and see how she does, call us for update in a week. Thanks

## 2016-08-10 NOTE — Telephone Encounter (Signed)
Patient notified

## 2016-08-10 NOTE — Telephone Encounter (Signed)
Please advise 

## 2016-08-10 NOTE — Telephone Encounter (Signed)
Pt called and Paxil is making her dizzy all the next day after taking the med the night before.  She would like to know if she could take half the dose and see if the dizziness would go away when taking this med. Please call her today.

## 2016-08-17 ENCOUNTER — Telehealth: Payer: Self-pay | Admitting: Neurology

## 2016-08-17 NOTE — Telephone Encounter (Signed)
FYI patient was taking Paxil.

## 2016-08-17 NOTE — Telephone Encounter (Signed)
Noted, thanks!

## 2016-08-17 NOTE — Telephone Encounter (Signed)
Elson ClanShirley Leather 2035/12/18. Her # is [phone]. She was calling with an update on her medication. She said she had been having dizzy spells so she went from taking 10 mg to 5 mg and she was still having dizzy spells. She has stopped taking this medication. She has had a couple spells since but not too bad. I could not hear the medication clearly on the voice mail. Thank you

## 2016-08-17 NOTE — Telephone Encounter (Signed)
Wait two weeks and see if dizziness still occurring. Once better, we can start a different medication. Thanks

## 2016-08-17 NOTE — Telephone Encounter (Signed)
Patient notified and agrees with plan. She also wants you aware she seen PCP and he gave her Meclizine to take when dizziness occurs.

## 2017-01-30 ENCOUNTER — Ambulatory Visit: Payer: Medicare Other | Admitting: Neurology

## 2017-02-22 ENCOUNTER — Ambulatory Visit (INDEPENDENT_AMBULATORY_CARE_PROVIDER_SITE_OTHER): Payer: Medicare Other | Admitting: Neurology

## 2017-02-22 ENCOUNTER — Encounter: Payer: Self-pay | Admitting: Neurology

## 2017-02-22 VITALS — BP 122/58 | HR 70 | Ht 60.5 in | Wt 146.0 lb

## 2017-02-22 DIAGNOSIS — R413 Other amnesia: Secondary | ICD-10-CM

## 2017-02-22 DIAGNOSIS — G4730 Sleep apnea, unspecified: Secondary | ICD-10-CM | POA: Diagnosis not present

## 2017-02-22 NOTE — Progress Notes (Signed)
NEUROLOGY FOLLOW UP OFFICE NOTE  Savannah Harris [id number]  08-18-1936  HISTORY OF PRESENT ILLNESS: I had the pleasure of seeing Savannah Harris in follow-up in the neurology clinic on 02/22/2017.  The patient was last seen 6 months ago for memory loss. Her MOCA score in October 2017 was 29/30 (30/30 in January 2016). Since her last visit, her husband reports her forgetfulness has been worse. She would not remember things from 30 minutes or the day prior. She is having a lot of doctor visits (13 over the next few weeks) and gets appointments confused even if she writes them down. On her last visit, we discussed effects of mood on memory and she started Paxil but stopped it after a few days due to dizziness. She does not recall taking the medication. She has been dealing with dizziness, worse upon standing. No loss of consciousness. She has seen ENT and Cardiology, and has stopped her BP medications. Her husband is concerned about her diastolic BP, this has been discussed with them by her Cardiologist. She is scheduled to see Endocrine to assess for adrenal insufficiency. She reports chronic fatigue, she is drowsy that she would fall asleep if she sits quietly on the sofa. She feels sleep is good, sometimes she has to go to the bathroom 2-3 times or lies in bed thinking about things "wasting my sleep time." She had previously been seeing Dr. Earl Gala and may have been told she has sleep apnea but has not followed up. No significant snoring. She has a lot of back pain and is scheduled for a CT myelogram.   HPI: This is a pleasant 81 yo RH woman with a history of hypertension, hyperlipidemia, diabetes, OSA, right MCA stenosis, osteoarthritis, and migraines, who presented fro evaluation of memory loss that she and her husband started noticing for several years, however worsened in 2015. She reports she cannot find the words she wants to say. She cannot recall small things from the day before, or what she  went to get in another room. She got lost twice while driving, missing her exit in the past month. She occasionally forgets to take her afternoon medication. She denies any missed bill payments. She reports being good with multitasking. Her husband reports that he would have to repeat several things to her, they are constantly looking for something. She would buy something and cannot find where she put it. She would need instructions on how to get to places she has been going to for years. She has difficulties grasping what is being said, with difficulty processing. She has left the stove on to do something else. Today, she could not turn the computer on because she could not recall that she had to put a password in. She has gotten more short with her husband, no paranoia.   She has an implant for incontinence. Her mother was diagnosed with Alzheimer's disease in her late 10s. She had a concussion skiing a few years ago, no loss of consciousness or skull fractures. She had seen a neurologist in [facility] 5=6 years ago for "mini-strokes" and recalls 2 episodes of left arm numbness. She has carotid artery aneurysms.   Diagnostic Data: Records and images were personally reviewed where available.  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 TSH and B12 normal.   PAST MEDICAL HISTORY: Past Medical History:  Diagnosis Date  . Arthritis    WRIST/ HANDS  . Asymptomatic vertebral artery stenosis    RIGHT SIDE  . Diverticulitis 11-30-13   during Christmas holiday- resolved  . GERD (gastroesophageal reflux disease)   . Hyperlipidemia   . Hypertension   . Leg cramps    OCCASIONAL  . Sleep apnea 11-30-13   mild-no cpap.  . Type 2 diabetes mellitus (HCC)   . Urge urinary incontinence     MEDICATIONS: Current Outpatient Prescriptions on File  Prior to Visit  Medication Sig Dispense Refill  . Ascorbic Acid (VITAMIN C) 1000 MG tablet Take 1,000 mg by mouth daily.    Marland Kitchen aspirin EC 81 MG tablet Take 81 mg by mouth at bedtime.    Marland Kitchen CANAGLIFLOZIN PO Take by mouth.    . Cholecalciferol (VITAMIN D3) 5000 UNITS CAPS Take 5,000 Units by mouth daily.     . cycloSPORINE (RESTASIS) 0.05 % ophthalmic emulsion Place 1 drop into both eyes 2 (two) times daily. May do addition dose    . glimepiride (AMARYL) 4 MG tablet Take 4 mg by mouth daily with breakfast.    . lisinopril-hydrochlorothiazide (PRINZIDE,ZESTORETIC) 20-25 MG per tablet Take 1 tablet by mouth every morning.     . metFORMIN (GLUCOPHAGE-XR) 500 MG 24 hr tablet Take 1,000 mg by mouth 2 (two) times daily.    Marland Kitchen omeprazole (PRILOSEC) 20 MG capsule Take 20 mg by mouth daily as needed (heart burn).     Marland Kitchen PARoxetine (PAXIL) 10 MG tablet Take 1 tablet (10 mg total) by mouth daily. 30 tablet 3  . Polyethylene Glycol 3350 (MIRALAX PO) Take 17 g by mouth daily.     Marland Kitchen PRAVASTATIN SODIUM PO Take by mouth.     No current facility-administered medications on file prior to visit.     ALLERGIES: Allergies  Allergen Reactions  . Adhesive [Tape] Other (See Comments)    BLISTERS  . Codeine Nausea And Vomiting  . Latex Other (See Comments)    BLISTERS    FAMILY HISTORY: Family History  Problem Relation Age of Onset  . Diabetes Mother   . Alzheimer's disease Mother     SOCIAL HISTORY: Social History   Social History  . Marital status: Married    Spouse name: N/A  . Number of children: N/A  . Years of education: N/A   Occupational History  . Not on file.   Social History Main Topics  . Smoking status: Never Smoker  . Smokeless tobacco: Never Used  . Alcohol use No  . Drug use: No  . Sexual activity: Not Currently   Other Topics Concern  . Not on file   Social History Narrative  . No narrative on file    REVIEW OF SYSTEMS: Constitutional: No fevers, chills, or sweats, +  generalized fatigue, no change in appetite Eyes: No visual changes, double vision, eye pain Ear, nose and throat: No hearing loss, ear pain, nasal congestion, sore throat Cardiovascular: No chest pain, palpitations Respiratory:  No shortness of breath at rest or with exertion, wheezes GastrointestinaI: No nausea, vomiting, diarrhea, abdominal pain, fecal incontinence Genitourinary:  No dysuria, urinary retention or frequency Musculoskeletal:  + neck pain, back pain Integumentary: No rash, pruritus, skin lesions Neurological: as above Psychiatric: No depression,+ insomnia, anxiety Endocrine: No palpitations, fatigue, diaphoresis, mood swings, change in appetite, change in weight, increased thirst Hematologic/Lymphatic:  No anemia, purpura, petechiae. Allergic/Immunologic: no itchy/runny eyes, nasal congestion, recent allergic reactions, rashes  PHYSICAL EXAM: Vitals:  02/22/17 1330  BP: (!) 122/58  Pulse: 70   General: No acute distress Head:  Normocephalic/atraumatic Neck: supple, no paraspinal tenderness, full range of motion Heart:  Regular rate and rhythm Lungs:  Clear to auscultation bilaterally Back: No paraspinal tenderness Skin/Extremities: No rash, no edema Neurological Exam: alert and oriented to person, place, and time. No aphasia or dysarthria. Fund of knowledge is appropriate.  Recent and remote memory are intact. 3/3 delayed recall. Attention and concentration are normal.    Able to name objects and repeat phrases. Cranial nerves: Pupils equal, round, reactive to light. Extraocular movements intact with no nystagmus. Visual fields full. Facial sensation intact. No facial asymmetry. Tongue, uvula, palate midline.  Motor: Bulk and tone normal, muscle strength 5/5 throughout with no pronator drift.  Sensation to light touch intact.  No extinction to double simultaneous stimulation.  Deep tendon reflexes 2+ throughout, toes downgoing.  Finger to nose testing intact.  Gait  narrow-based and steady, mild difficulty with tandem walk but able.  Romberg negative.  IMPRESSION: This is a pleasant 81 yo RH woman with hypertension, hyperlipidemia, diabetes, migraines, with worsening memory loss. 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 Her MOCA score in October 2017 was 29/30, her husband reports worsening, she will be scheduled for Neurocognitive testing to further evaluate memory complaints. We again discussed effects of anxiety on memory, she reports anxiety with all the doctor appointments she has, she has neck and back issues, as well as dizziness upon standing. She is set to see an endocrinologist to assess for adrenal insufficiency. We discussed the fatigue and daytime drowsiness, she was previously told there may be sleep apnea but has not followed up, she would like to re-establish with Dr. Earl Galasborne who saw her in the past. We also again discussed the importance of control of vascular risk factors, physical exercise, and brain stimulation exercises for brain health. She will follow-up in 6 months or earlier if needed.   Thank you for allowing me to participate in her care.  Please do not hesitate to call for any questions or concerns.  The duration of this appointment visit was 25 minutes of face-to-face time with the patient.  Greater than 50% of this time was spent in counseling, explanation of diagnosis, planning of further management, and coordination of care.   Patrcia DollyKaren Dayanira Giovannetti, M.D.   CC: Dr. Cyndia BentBadger

## 2017-02-22 NOTE — Patient Instructions (Signed)
1. Schedule Neurocognitive testing with Dr. Alinda DoomsBailar 2. Refer to her prior sleep specialist Dr. Earl Galasborne for re-evaluation of sleep apnea 3. Good luck with all your doctor appointments 4. Follow-up in 6 months, call for any changes  You have been referred for a neurocognitive evaluation in our office.   The evaluation takes approximately two hours. The first part of the appointment is a clinical interview with the neuropsychologist (Dr. Elvis CoilMaryBeth Bailar). Please bring someone with you to this appointment if possible, as it is helpful for Dr. Alinda DoomsBailar to hear from both you and another adult who knows you well. After speaking with Dr. Alinda DoomsBailar, you will complete testing with her technician. The testing includes a variety of tasks- mostly question-and-answer, some paper-and-pencil. There is nothing you need to do to prepare for this appointment, but having a good night's sleep prior to the testing, and bringing eyeglasses and hearing aids (if you wear them), is advised.   About a week after the evaluation, you will return to follow up with Dr. Alinda DoomsBailar to review the test results. This appointment is about 30 minutes. If you would like a family member to receive this information as well, please bring them to the appointment.   We have to reserve several hours of the neuropsychologist's time and the psychometrician's time for your evaluation appointment. As such, please note that there is a No-Show fee of $100. If you are unable to attend any of your appointments, please contact our office as soon as possible to reschedule.

## 2017-02-26 ENCOUNTER — Other Ambulatory Visit: Payer: Self-pay | Admitting: Neurosurgery

## 2017-02-26 DIAGNOSIS — M4722 Other spondylosis with radiculopathy, cervical region: Secondary | ICD-10-CM

## 2017-03-18 ENCOUNTER — Ambulatory Visit
Admission: RE | Admit: 2017-03-18 | Discharge: 2017-03-18 | Disposition: A | Discharge status: Home / Self Care | Payer: Medicare Other | Source: Ambulatory Visit | Attending: Neurosurgery | Admitting: Neurosurgery

## 2017-03-18 DIAGNOSIS — M4722 Other spondylosis with radiculopathy, cervical region: Secondary | ICD-10-CM

## 2017-03-18 MED ORDER — IOPAMIDOL (ISOVUE-M 300) INJECTION 61%
10.0000 mL | Freq: Once | INTRAMUSCULAR | Status: AC | PRN
Start: 2017-03-18 — End: 2017-03-18
  Administered 2017-03-18: 10 mL via INTRATHECAL

## 2017-03-18 MED ORDER — DIAZEPAM 5 MG PO TABS: 5.0000 mg | ORAL_TABLET | Freq: Once | ORAL | Status: DC

## 2017-03-18 NOTE — Discharge Instructions (Signed)

## 2017-03-28 ENCOUNTER — Encounter: Payer: Self-pay | Admitting: Registered"

## 2017-03-28 ENCOUNTER — Encounter: Payer: Medicare Other | Attending: Family Medicine | Admitting: Registered"

## 2017-03-28 DIAGNOSIS — Z713 Dietary counseling and surveillance: Secondary | ICD-10-CM | POA: Diagnosis not present

## 2017-03-28 DIAGNOSIS — I10 Essential (primary) hypertension: Secondary | ICD-10-CM | POA: Insufficient documentation

## 2017-03-28 DIAGNOSIS — E118 Type 2 diabetes mellitus with unspecified complications: Secondary | ICD-10-CM | POA: Diagnosis present

## 2017-03-28 NOTE — Patient Instructions (Addendum)
   Look into Nordic Naturals fish oil (is a good quality brand)  For a protein and healthy fat at breakfast consider adding walnuts, ground flaxseed (start with 2 teaspoons)   Consider having protein with your popcorn snack  Aim to have protein with your meal and snacks  Read the handout on the morning high blood glucose reading for more information about what may be causing high reading  Consider checking your blood sugar 2 hrs after a few meals  Discuss exercise options with your doctor, keep up the good routine of walking each evening!

## 2017-03-28 NOTE — Progress Notes (Signed)
Diabetes Self-Management Education  Visit Type: First/Initial  Appt. Start Time: 1025 Appt. End Time: 1210  03/28/2017  [name], identified by name and date of birth, is a 81 y.o. female with a diagnosis of Diabetes: Type 2.   ASSESSMENT Pt states she is concerned about her morning high BG. Pt states she has an appointment next month to see endocrinologist. Pt states she has had an issue with dizziness and fell in Jan resulting in a broken elbow. Pt reports after reading that dizziness is a potential side effect of Invokana, she told her doctor she would not take that medine and within a couple of months her symptoms improved.  Pt states she used to drink soda, now drinking no-sugar soda. Pt would like to do more exercise including water aerobics and weight training, but back pain from spinal stenosis limits her activity.     Diabetes Self-Management Education - 03/28/17 1045      Visit Information   Visit Type First/Initial     Initial Visit   Diabetes Type Type 2   Are you currently following a meal plan? No   Are you taking your medications as prescribed? Yes   Date Diagnosed 2012     Psychosocial Assessment   Patient Belief/Attitude about Diabetes Motivated to manage diabetes   How often do you need to have someone help you when you read instructions, pamphlets, or other written materials from your doctor or pharmacy? 1 - Never   What is the last grade level you completed in school? 12     Complications   Last HgB A1C per patient/outside source 8.6 %  per patient   How often do you check your blood sugar? 1-2 times/day   Fasting Blood glucose range (mg/dL) [phone];914-782;>956180-200;130-179;>200  150-230 upon waking   Number of hypoglycemic episodes per month 0   Number of hyperglycemic episodes per week 1   Can you tell when your blood sugar is high? No   Have you had a dilated eye exam in the past 12 months? Yes   Have you had a dental exam in the past 12 months? Yes   Are you  checking your feet? Yes   How many days per week are you checking your feet? 7     Dietary Intake   Breakfast skim milk, cereal, fruit OR yogurt OR apple with PB OR boiled egg with toast   Snack (morning) none OR peanuts OR PB on bread OR popcorn with [place] tea/lemonade   Lunch sandwich chicken salad   Snack (afternoon) none   Dinner chicken, mixed frozen veggies, rice   Snack (evening) none   Beverage(s) water, lemonade/tea [place] once a week     Exercise   Exercise Type Light (walking / raking leaves)   How many days per week to you exercise? 7   How many minutes per day do you exercise? 25   Total minutes per week of exercise 175     Patient Education   Previous Diabetes Education No   Disease state  Definition of diabetes, type 1 and 2, and the diagnosis of diabetes;Factors that contribute to the development of diabetes   Nutrition management  Role of diet in the treatment of diabetes and the relationship between the three main macronutrients and blood glucose level;Reviewed blood glucose goals for pre and post meals and how to evaluate the patients' food intake on their blood glucose level.   Physical activity and exercise  Role of  exercise on diabetes management, blood pressure control and cardiac health.   Medications Reviewed patients medication for diabetes, action, purpose, timing of dose and side effects.   Monitoring Identified appropriate SMBG and/or A1C goals.;Purpose and frequency of SMBG.   Acute complications Discussed and identified patients' treatment of hyperglycemia.   Chronic complications Relationship between chronic complications and blood glucose control     Individualized Goals (developed by patient)   Nutrition General guidelines for healthy choices and portions discussed   Monitoring  test blood glucose pre and post meals as discussed   Reducing Risk increase portions of nuts and seeds     Outcomes   Expected Outcomes Demonstrated interest in  learning. Expect positive outcomes   Future DMSE PRN   Program Status Completed      Individualized Plan for Diabetes Self-Management Training:   Learning Objective:  Patient will have a greater understanding of diabetes self-management. Patient education plan is to attend individual and/or group sessions per assessed needs and concerns.   Patient Instructions   Look into Nordic Naturals fish oil (is a good quality brand)  For a protein and healthy fat at breakfast consider adding walnuts, ground flaxseed (start with 2 teaspoons)   Consider having protein with your popcorn snack  Aim to have protein with your meal and snacks  Read the handout on the morning high blood glucose reading for more information about what may be causing high reading  Consider checking your blood sugar 2 hrs after a few meals  Discuss exercise options with your doctor, keep up the good routine of walking each evening!  Expected Outcomes:  Demonstrated interest in learning. Expect positive outcomes  Education material provided: Living Well with Diabetes, A1C conversion sheet, Support group flyer and Carbohydrate counting sheet, arm chair activity, Dawn Effect/Somogyi article  If problems or questions, patient to contact team via:  Phone and MyChart  Future DSME appointment: PRN

## 2017-05-16 ENCOUNTER — Encounter: Payer: Medicare Other | Admitting: Psychology

## 2017-05-16 ENCOUNTER — Encounter: Payer: Self-pay | Admitting: Physical Therapy

## 2017-05-16 ENCOUNTER — Ambulatory Visit: Payer: Medicare Other | Attending: Internal Medicine | Admitting: Physical Therapy

## 2017-05-16 DIAGNOSIS — R2681 Unsteadiness on feet: Secondary | ICD-10-CM | POA: Insufficient documentation

## 2017-05-16 DIAGNOSIS — R42 Dizziness and giddiness: Secondary | ICD-10-CM | POA: Insufficient documentation

## 2017-05-16 DIAGNOSIS — H8112 Benign paroxysmal vertigo, left ear: Secondary | ICD-10-CM | POA: Diagnosis present

## 2017-05-16 NOTE — Therapy (Signed)
[facility] [address] [place], [place], [address] Phone: [phone]   Fax:  [phone]  Physical Therapy Evaluation  Patient Details  Name: Savannah BalShirley A Polan MRN: [id number] Date of Birth: 11-18-1935 Referring Provider: Fulton ReekJeffrey Scott Kerr, MD  Encounter Date: 05/16/2017      PT End of Session - 05/16/17 1945    Visit Number 1   Number of Visits 17   Date for PT Re-Evaluation 07/15/17   Authorization Type [employer] Medicare-G code and 10th visit progress note   PT Start Time 1317   PT Stop Time 1406   PT Time Calculation (min) 49 min   Activity Tolerance Patient tolerated treatment well   Behavior [facility] for tasks assessed/performed      Past Medical History:  Diagnosis Date  . Arthritis    WRIST/ HANDS  . Asymptomatic vertebral artery stenosis    RIGHT SIDE  . Diverticulitis 11-30-13   during Christmas holiday- resolved  . GERD (gastroesophageal reflux disease)   . Hyperlipidemia   . Hypertension   . Leg cramps    OCCASIONAL  . Sleep apnea 11-30-13   mild-no cpap.  . Type 2 diabetes mellitus (HCC)   . Urge urinary incontinence     Past Surgical History:  Procedure Laterality Date  . CATARACT EXTRACTION W/ INTRAOCULAR LENS  IMPLANT, BILATERAL    . COLONOSCOPY WITH PROPOFOL N/A 12/15/2013   Procedure: COLONOSCOPY WITH PROPOFOL;  Surgeon: Charolett BumpersMartin K Johnson, MD;  Location: [facility];  Service: Endoscopy;  Laterality: N/A;  . INTERSTIM IMPLANT PLACEMENT N/A 07/28/2013   Procedure: Leane PlattINTERSTIM IMPLANT FIRST STAGE;  Surgeon: Martina SinnerScott A MacDiarmid, MD;  [facility] [place];  Service: Urology;  Laterality: N/A;  . INTERSTIM IMPLANT PLACEMENT N/A 07/28/2013   Procedure: Leane PlattINTERSTIM IMPLANT SECOND STAGE;  Surgeon: Martina SinnerScott A MacDiarmid, MD;  [facility] [place];  Service: Urology;  Laterality: N/A;  . LEFT WRSIT TENDON REPAIR  1999  . PUBOVAGINAL SLING  08-26-2007   SPARC SLING   . TONSILLECTOMY AND ADENOIDECTOMY  AS CHILD  . TOTAL ABDOMINAL HYSTERECTOMY W/ BILATERAL SALPINGOOPHORECTOMY  1970s   AND APPENDECTOMY  . TOTAL HIP ARTHROPLASTY Right 12-16-2008    There were no vitals filed for this visit.       Subjective Assessment - 05/16/17 1327    Subjective Dizziness began one year ago; when pt would stand up she would stagger all over the place.  Progressively became worse over time; pt felt it was due to one of her medications for diabetes which has since been changed with some improvement in dizziness overall.  Has experienced one fall when she looked upwards, fell backwards and fractured elbow.  Visited ENT and had negative Hallpike-Dix test.  Has also had work up by cardiologist and neurologist and most recently by endocrinologist who referred pt to vestibular rehab.     Pertinent History memory loss, hearing loss and tinnitus, HTN, DM, OSA, migraine headaches, osteopenia, OA, chronic kidney disease, THR, fall with elbow fracture   Limitations Standing;Walking   Patient Stated Goals To figure out where the dizziness is coming from   Currently in Pain? No/denies            [facility] - 05/16/17 1303      Assessment   Medical Diagnosis Dizziness; BPPV   Referring Provider Fulton ReekJeffrey Scott Kerr, MD   Onset Date/Surgical Date 05/01/17     Precautions   Precautions Other (comment);Fall   Precaution  Comments memory loss, hearing loss and tinnitus, HTN, DM, OSA, migraine headaches, osteopenia, OA, chronic kidney disease, THR, fall with elbow fracture     Balance Screen   Has the patient fallen in the past 6 months Yes   How many times? one   Has the patient had a decrease in activity level because of a fear of falling?  Yes  due to lack of energy   Is the patient reluctant to leave their home because of a fear of falling?  Yes  has husband go with her when she goes out in the community     Cabin crewHome Environment   Living Environment Private residence    Living Arrangements Spouse/significant other   Type of Home House   Home Access Stairs to enter   Entrance Stairs-Number of Steps 4   Entrance Stairs-Rails Right;Left   Home Layout One level   Additional Comments holds on to husband if she feels off balance     Prior Function   Level of Independence Needs assistance with homemaking;Independent with gait;Independent with transfers     Observation/Other Assessments   Focus on Therapeutic Outcomes (FOTO)  62(38% limited, predicted 29% limited)   Other Surveys  Other Surveys   Dizziness Handicap Inventory [facility](DHI)  52 moderate            Vestibular Assessment - 05/16/17 1333      Vestibular Assessment   General Observation pt has history of diplopia; denies changes in vision or changes in hearing, has h/o headaches, HOH and tinnitus, reports nausea but denies vomiting.  Pt has long history of earaches and ear infections-has tubes in her ears and has been on multiple rounds of antibiotics     Symptom Behavior   Type of Dizziness "World moves"   Frequency of Dizziness daily   Duration of Dizziness 1 minute or 2   Aggravating Factors Looking up to the ceiling;Sit to stand   Relieving Factors Comments  holding on to something for support     Occulomotor Exam   Occulomotor Alignment Normal   Spontaneous Absent   Gaze-induced Absent   Smooth Pursuits Intact   Saccades Slow   Comment Convergence intact but pt has long history of persistent horizontal diplopia     Vestibulo-Occular Reflex   VOR to Slow Head Movement Normal   VOR Cancellation Normal   Comment HIT: positive bilaterally     Visual Acuity   Static TBA     Positional Testing   Dix-Hallpike Dix-Hallpike Right;Dix-Hallpike Left   Horizontal Canal Testing Horizontal Canal Right;Horizontal Canal Left     Dix-Hallpike Right   Dix-Hallpike Right Duration >1 minute   Dix-Hallpike Right Symptoms Downbeat Nystagmus     Dix-Hallpike Left   Dix-Hallpike Left Duration  0   Dix-Hallpike Left Symptoms No nystagmus     Horizontal Canal Right   Horizontal Canal Right Duration 0    Horizontal Canal Right Symptoms Normal     Horizontal Canal Left   Horizontal Canal Left Duration 0   Horizontal Canal Left Symptoms Normal     Orthostatics   BP supine (x 5 minutes) 121/66   HR supine (x 5 minutes) 69   BP sitting 128/61  reported lightheadedness and pressure   HR sitting 72   BP standing (after 1 minute) 131/61   HR standing (after 1 minute) 70   BP standing (after 3 minutes) 124/59   HR standing (after 3 minutes) 72  Objective measurements completed on examination: See above findings.                  PT Education - 05/16/17 1956    Education provided Yes   Education Details clinical findings, PT POC, goals   Person(s) Educated Patient   Methods Explanation   Comprehension Verbalized understanding          PT Short Term Goals - 05/16/17 2012      PT SHORT TERM GOAL #1   Title Pt will tolerate assessment of DVA, gait velocity and FGA with LTG to be revised   Time 4   Period Weeks   Status New   Target Date 06/15/17     PT SHORT TERM GOAL #2   Title Pt will report dizziness as <2/10 during sit > stand and when looking up   Time 4   Period Weeks   Status New   Target Date 06/15/17     PT SHORT TERM GOAL #3   Title Pt will tolerate treatment for anterior canal cupulolithiasis   Time 4   Period Weeks   Status New   Target Date 06/15/17     PT SHORT TERM GOAL #4   Title Pt will return demonstrate habituation, gaze adaptation and balance HEP with supervision   Time 4   Period Weeks   Status New   Target Date 06/15/17           PT Long Term Goals - 05/16/17 2016      PT LONG TERM GOAL #1   Title Pt will demonstrate independence with balance, habituation and gaze adaptation HEP   Time 8   Period Weeks   Status New   Target Date 07/15/17     PT LONG TERM GOAL #2   Title Pt will demonstrate  improved VOR as indicated by DVA of 3-4 line difference   Baseline TBD   Time 8   Period Weeks   Status New   Target Date 07/15/17     PT LONG TERM GOAL #3   Title Pt will report 0/10 vertigo with positional testing, sit > stand and looking up   Time 8   Period Weeks   Status New   Target Date 07/15/17     PT LONG TERM GOAL #4   Title Pt will decrease falls risk during gait in community as indicated by gait velocity of >2.93ft/sec   Baseline TBD   Time 8   Period Weeks   Status New   Target Date 07/15/17     PT LONG TERM GOAL #5   Title Pt will improve balance during dynamic gait challenges as indicated by FGA score of > or = 23/30   Baseline TBD   Time 8   Period Weeks   Status New   Target Date 07/15/17     Additional Long Term Goals   Additional Long Term Goals Yes     PT LONG TERM GOAL #6   Title Pt will improve DHI score by 18 points to decrease severity of handicap   Baseline 52 moderate   Time 8   Period Weeks   Status New   Target Date 07/15/17                Plan - 05/16/17 1956    Clinical Impression Statement Pt is a 81 year old female presenting to OPPT neuro for medium complexity PT evaluation for vertigo and fall with injury Pt's PMH  significant for the following: memory loss, hearing loss and tinnitus, HTN, DM, OSA, migraine headaches, osteopenia, OA, chronic kidney disease, THR, fall with elbow fracture. The following deficits were noted during pt's exam: persistent downbeat nystagmus and mild vertigo with R Hallpike-Dix indicating anterior canal cupulolithiasis (laterality unknown), impaired VOR and bilateral vestibular hypofunction, impaired balance and gait. Orthostatic vitals assessed and do not appear at this time to be a contributing factor in patient's dizziness.  Pt's condition is evolving and would benefit from skilled PT to address these impairments and functional limitations to maximize functional mobility independence and reduce falls  risk.   History and Personal Factors relevant to plan of care: history of hearing loss and tinnitus, ear infections with significant use of antibiotics, HTN, DM, osteopenia, h/o fall with injury   Clinical Presentation Evolving   Clinical Presentation due to: fall with injury, osteopenia, vestibular hypofunction likely chronic in nature   Clinical Decision Making Moderate   Rehab Potential Good   PT Frequency 2x / week   PT Duration 8 weeks   PT Treatment/Interventions ADLs/Self Care Home Management;Canalith Repostioning;Gait training;Functional mobility training;Therapeutic activities;Therapeutic exercise;Neuromuscular re-education;Balance training;Patient/family education;Vestibular   PT Next Visit Plan ASSESS VITALS EACH VISIT; re-assess L and R anterior canals with Frenzel lenses-I believe it is L anterior canal cupulolithiasis; treat if indicated.  Assess DVA, gait velocity and FGA and revise LTG   PT Home Exercise Plan habituation, gaze adaptation, balance, treatment of cupulolithiasis   Consulted and Agree with Plan of Care Patient      Patient will benefit from skilled therapeutic intervention in order to improve the following deficits and impairments:  Decreased balance, Dizziness, Difficulty walking  Visit Diagnosis: Dizziness and giddiness  BPPV (benign paroxysmal positional vertigo), left  Unsteadiness on feet      G-Codes - 05-17-17 Feb 21, 2022    Functional Assessment Tool Used (Outpatient Only) DHI; severity of vertigo with positional testing, clinical judgment   Functional Limitation Changing and maintaining body position   Changing and Maintaining Body Position Current Status (G9562) At least 40 percent but less than 60 percent impaired, limited or restricted   Changing and Maintaining Body Position Goal Status (Z3086) At least 1 percent but less than 20 percent impaired, limited or restricted       Problem List Patient Active Problem List   Diagnosis Date Noted  .  Sleep apnea 02/22/2017  . Memory loss 10/23/2014  . Essential hypertension 10/23/2014  . Hyperlipidemia 10/23/2014  . Type 2 diabetes mellitus with complication (HCC) 10/23/2014  . Diastasis recti 03/02/2013  . Constipation, chronic 03/02/2013    Edman Circle, PT, DPT 2017/05/17    8:25 PM    [facility] [address] [place], [place], [address] Phone: [phone]   Fax:  [phone]  Name: RICHETTA CUBILLOS MRN: [id number] Date of Birth: 29-Oct-1935

## 2017-05-23 ENCOUNTER — Ambulatory Visit (INDEPENDENT_AMBULATORY_CARE_PROVIDER_SITE_OTHER): Payer: Medicare Other | Admitting: Psychology

## 2017-05-23 ENCOUNTER — Encounter: Payer: Self-pay | Admitting: Psychology

## 2017-05-23 DIAGNOSIS — R413 Other amnesia: Secondary | ICD-10-CM | POA: Diagnosis not present

## 2017-05-23 NOTE — Progress Notes (Signed)
   Neuropsychology Note  Savannah Harris came in today for 1 hour of neuropsychological testing with technician, Wallace Kellerana Chamberlain, BS, under the supervision of Dr. Elvis CoilMaryBeth Bailar. The patient did not appear overtly distressed by the testing session, per behavioral observation or via self-report to the technician. Rest breaks were offered. Savannah Harris will return within 2 weeks for a feedback session with Dr. Alinda DoomsBailar at which time her test performances, clinical impressions and treatment recommendations will be reviewed in detail. The patient understands she can contact our office should she require our assistance before this time.  Full report to follow.

## 2017-05-23 NOTE — Progress Notes (Signed)
NEUROPSYCHOLOGICAL INTERVIEW (CPT: [id number])  Name: Savannah Harris Date of Birth: 1936-01-11 Date of Interview: 05/23/2017  Reason for Referral:  Savannah BalShirley A Dubeau is a 81 y.o. female who is referred for neuropsychological evaluation by Dr. Patrcia DollyKaren Aquino of [facility] due to concerns about memory loss. This patient is unaccompanied in the office for today's appointment.  History of Presenting Problem:  Savannah Harris reported gradual onset of short term memory decline several years ago. She and her husband are concerned about this because there is a family history of Alzheimer's dementia in the patient's mother. Her mother was diagnosed in her late 6660s and was in a nursing home for 9 years before passing away at age 79.   The patient has seen Dr. Karel JarvisAquino in 2016, 07/2016 and most recently on 02/22/2017. Her MOCA score in October 2017 was 29/30.  Upon direct questioning, the patient reported the following cognitive complaints: forgetfulness for recent conversations/things she has been told, some repeating of stories/statements, misplacing and losing items (in May she lost several valuable things including a $125 gift card she was going to give to her daughter, and two of her rings), losing track of what she was saying in the middle of a conversation, needing to re-read information in order to comprehend it, and word finding difficulty. She denies difficulty with getting lost when driving but she does have to think through routes now before she leaves. Her husband is doing more of the driving because he prefers to (he has reported that she forgets how to get to familiar places, per visit notes from Dr. Karel JarvisAquino). She still manages her own finances, medications, and appointments and denies any difficulty with this. She still cooks but her husband is helping her more.   CT of the head completed on 10/25/2014 revealed no intracranial hemorrhage or CT evidence of large acute infarct. 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   Aside from memory decline, the patient complains of occasionally falling asleep without intending to when she is sitting down, even when in the middle of a task. For example, 2-3 weeks ago, she put a pot of green beans on the stove to heat and then sat down to make a to-do list. Some time later she woke up to the fire alarm going off and the fire department calling her home. She still had the pen and paper in her hand.   She denied any sleep difficulty at night and feels she has been sleeping better than ever. She has not seen a sleep specialist as recommended because she does not have insomnia, but I explained that her daytime sleepiness or possible sleep attacks should be further evaluated by a sleep specialist.  Physically, she complains of some back and leg pain due to spinal stenosis. Additionally, she has dizziness which started last fall and is very bothersome to her. She had a fall in January 2018 due to dizziness (was looking up to take a wreath off her door and fell and broke a bone in her left arm). She has not had any falls since then.   She reports her diabetes is well controlled. She saw a nutritionist recently. She reports good appetite. She tells me she is trying to lose 6-8 lbs.  Per Dr. Rosalyn GessAquino's notes, the patient had a concussion skiing several years ago, no loss of consciousness or skull fractures.  She had seen a neurologist in [facility] 5-6 years ago for "mini-strokes" and recalls 2 episodes of left arm numbness. She has carotid artery aneurysms.   She denies any significant depression and states that her mood is stable and generally positive. She does admit that she feels a lot of stress, mostly due to her husband.   Psychiatric history was denied. She has no history of mental health treatment.    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 She retired at age 19.  Marital history: Married almost 55 years. 2 children, 4 grandkids Alcohol: None Tobacco: Never SA: None   Medical History: Past Medical History:  Diagnosis Date  . Arthritis    WRIST/ HANDS  . Asymptomatic vertebral artery stenosis    RIGHT SIDE  . Diverticulitis 11-30-13   during Christmas holiday- resolved  . GERD (gastroesophageal reflux disease)   . Hyperlipidemia   . Hypertension   . Leg cramps    OCCASIONAL  . Sleep apnea 11-30-13   mild-no cpap.  . Type 2 diabetes mellitus (HCC)   . Urge urinary incontinence      Current Medications:  Outpatient Encounter Prescriptions as of 05/23/2017  Medication Sig  . Ascorbic Acid (VITAMIN C) 1000 MG tablet Take 1,000 mg by mouth daily.  Marland Kitchen aspirin EC 81 MG tablet Take 81 mg by mouth at bedtime.  Marland Kitchen CANAGLIFLOZIN PO Take by mouth.  . Cholecalciferol (VITAMIN D3) 5000 UNITS CAPS Take 5,000 Units by mouth daily.   . cycloSPORINE (RESTASIS) 0.05 % ophthalmic emulsion Place 1 drop into both eyes 2 (two) times daily. May do addition dose  . Dulaglutide (TRULICITY) 0.75 MG/0.5ML SOPN Inject into the skin once a week.  Marland Kitchen glimepiride (AMARYL) 4 MG tablet Take 4 mg by mouth daily with breakfast.  . meloxicam (MOBIC) 15 MG tablet Take 15 mg by mouth daily.  . metFORMIN (GLUCOPHAGE-XR) 500 MG 24 hr tablet Take 500 mg by mouth 2 (two) times daily.   . Omega-3 Fatty Acids (FISH OIL) 1000 MG CAPS Take by mouth.  Marland Kitchen PRAVASTATIN SODIUM PO Take by mouth.  . sitaGLIPtin (JANUVIA) 100 MG tablet Take 100 mg by mouth daily.   No facility-administered encounter medications on file as of 05/23/2017.      Behavioral Observations:   Appearance: Neatly dressed and groomed Gait:  Ambulated independently, no gross abnormalities observed Speech: Fluent; normal rate, rhythm and volume. Minimal word finding difficulty. Thought process: Linear, goal directed Affect: Full, bright Interpersonal: Pleasant, appropriate, engaged   TESTING: There is medical necessity to proceed with neuropsychological assessment as the results will be used to aid in differential diagnosis and clinical decision-making and to inform specific treatment recommendations. Per the patient and medical records reviewed, there has been a change in cognitive functioning and a reasonable suspicion of neurodegenerative dementia.  Following the clinical interview, the patient completed a full battery of neuropsychological testing with my psychometrician under my supervision.   PLAN: The patient will return to see me for a follow-up session at which time her test performances and my impressions and treatment recommendations will be reviewed in detail.  Full report to follow.

## 2017-05-27 ENCOUNTER — Encounter: Payer: Medicare Other | Admitting: Psychology

## 2017-05-27 ENCOUNTER — Ambulatory Visit: Payer: Medicare Other | Admitting: Physical Therapy

## 2017-05-27 ENCOUNTER — Encounter: Payer: Self-pay | Admitting: Physical Therapy

## 2017-05-27 DIAGNOSIS — R42 Dizziness and giddiness: Secondary | ICD-10-CM | POA: Diagnosis not present

## 2017-05-27 DIAGNOSIS — R2681 Unsteadiness on feet: Secondary | ICD-10-CM

## 2017-05-27 DIAGNOSIS — H8112 Benign paroxysmal vertigo, left ear: Secondary | ICD-10-CM

## 2017-05-27 NOTE — Therapy (Signed)
[facility] [address] [place], [place], [address] Phone: [phone]   Fax:  [phone]  Physical Therapy Treatment  Patient Details  Name: Savannah Harris MRN: [id number] Date of Birth: 06/18/1936 Referring Provider: Fulton Reek, MD  Encounter Date: 05/27/2017      PT End of Session - 05/27/17 1245    Visit Number 2   Number of Visits 17   Date for PT Re-Evaluation 07/15/17   Authorization Type U[employer] Medicare-G code and 10th visit progress note   PT Start Time 1148   PT Stop Time 1230   PT Time Calculation (min) 42 min   Activity Tolerance Patient tolerated treatment well   Behavior During Therapy [facility] for tasks assessed/performed      Past Medical History:  Diagnosis Date  . Arthritis    WRIST/ HANDS  . Asymptomatic vertebral artery stenosis    RIGHT SIDE  . Diverticulitis 11-30-13   during Christmas holiday- resolved  . GERD (gastroesophageal reflux disease)   . Hyperlipidemia   . Hypertension   . Leg cramps    OCCASIONAL  . Sleep apnea 11-30-13   mild-no cpap.  . Type 2 diabetes mellitus (HCC)   . Urge urinary incontinence     Past Surgical History:  Procedure Laterality Date  . CATARACT EXTRACTION W/ INTRAOCULAR LENS  IMPLANT, BILATERAL    . COLONOSCOPY WITH PROPOFOL N/A 12/15/2013   Procedure: COLONOSCOPY WITH PROPOFOL;  Surgeon: Charolett Bumpers, MD;  Location: [facility];  Service: Endoscopy;  Laterality: N/A;  . INTERSTIM IMPLANT PLACEMENT N/A 07/28/2013   Procedure: Leane Platt IMPLANT FIRST STAGE;  Surgeon: Martina Sinner, MD;  Location: [facility];  Service: Urology;  Laterality: N/A;  . INTERSTIM IMPLANT PLACEMENT N/A 07/28/2013   Procedure: Leane Platt IMPLANT SECOND STAGE;  Surgeon: Martina Sinner, MD;  Location: [facility];  Service: Urology;  Laterality: N/A;  . LEFT WRSIT TENDON REPAIR  1999  . PUBOVAGINAL SLING  08-26-2007   SPARC SLING   . TONSILLECTOMY AND ADENOIDECTOMY  AS CHILD  . TOTAL ABDOMINAL HYSTERECTOMY W/ BILATERAL SALPINGOOPHORECTOMY  1970s   AND APPENDECTOMY  . TOTAL HIP ARTHROPLASTY Right 12-16-2008    There were no vitals filed for this visit.      Subjective Assessment - 05/27/17 1153    Subjective Pt returns and reports that she has increased dizziness and spinning over past 2-3 days; no vertigo today but has had to have her husband help stabilize her when she stands up.   Pertinent History memory loss, hearing loss and tinnitus, HTN, DM, OSA, migraine headaches, osteopenia, OA, chronic kidney disease, THR, fall with elbow fracture   Limitations Standing;Walking   Patient Stated Goals To figure out where the dizziness is coming from   Currently in Pain? No/denies            [facility] PT Assessment - 05/27/17 1216      Standardized Balance Assessment   Standardized Balance Assessment 10 meter walk test   10 Meter Walk 9.75 seconds or 3.36 ft/sec     Functional Gait  Assessment   Gait assessed  Yes   Gait Level Surface Walks 20 ft, slow speed, abnormal gait pattern, evidence for imbalance or deviates 10-15 in outside of the 12 in walkway width. Requires more than 7 sec to ambulate 20 ft.   Change in Gait Speed Able to change speed, demonstrates mild gait deviations, deviates 6-10 in outside of the 12 in  walkway width, or no gait deviations, unable to achieve a major change in velocity, or uses a change in velocity, or uses an assistive device.   Gait with Horizontal Head Turns Performs head turns with moderate changes in gait velocity, slows down, deviates 10-15 in outside 12 in walkway width but recovers, can continue to walk.   Gait with Vertical Head Turns Performs task with slight change in gait velocity (eg, minor disruption to smooth gait path), deviates 6 - 10 in outside 12 in walkway width or uses assistive device   Gait and Pivot Turn Pivot turns safely in greater than 3 sec and stops with no  loss of balance, or pivot turns safely within 3 sec and stops with mild imbalance, requires small steps to catch balance.   Step Over Obstacle Is able to step over one shoe box (4.5 in total height) but must slow down and adjust steps to clear box safely. May require verbal cueing.   Gait with Narrow Base of Support Ambulates less than 4 steps heel to toe or cannot perform without assistance.   Gait with Eyes Closed Cannot walk 20 ft without assistance, severe gait deviations or imbalance, deviates greater than 15 in outside 12 in walkway width or will not attempt task.   Ambulating Backwards Walks 20 ft, uses assistive device, slower speed, mild gait deviations, deviates 6-10 in outside 12 in walkway width.   Steps Alternating feet, no rail.   Total Score 14   FGA comment: 14/30            Vestibular Assessment - 05/27/17 1154      Visual Acuity   Static 6  but very blurry-due for vision exam   Dynamic 4  but significant neck guarding     Positional Testing   Dix-Hallpike Dix-Hallpike Right;Dix-Hallpike Left     Dix-Hallpike Right   Dix-Hallpike Right Duration 5 seconds   Dix-Hallpike Right Symptoms Downbeat Nystagmus  very brief     Dix-Hallpike Left   Dix-Hallpike Left Duration 0   Dix-Hallpike Left Symptoms No nystagmus     Horizontal Canal Right   Horizontal Canal Right Duration 0   Horizontal Canal Right Symptoms Normal     Horizontal Canal Left   Horizontal Canal Left Duration 0   Horizontal Canal Left Symptoms Normal                  Vestibular Treatment/Exercise - 05/27/17 1205      Vestibular Treatment/Exercise   Vestibular Treatment Provided Canalith Repositioning;Gaze   Canalith Repositioning Epley Manuever Right      EPLEY MANUEVER RIGHT   Number of Reps  1   Overall Response Symptoms Resolved               PT Education - 05/27/17 1245    Education provided Yes   Education Details falls risk based on FGA   Person(s) Educated  Patient   Methods Explanation   Comprehension Verbalized understanding          PT Short Term Goals - 05/27/17 1240      PT SHORT TERM GOAL #1   Title Pt will tolerate assessment of DVA, gait velocity and FGA with LTG to be revised   Time 4   Status Achieved     PT SHORT TERM GOAL #2   Title Pt will report dizziness as <2/10 during sit > stand and when looking up   Time 4   Period Weeks   Status New  Target Date 06/15/17     PT SHORT TERM GOAL #3   Title Pt will tolerate treatment for anterior canal cupulolithiasis   Time 4   Period Weeks   Status New   Target Date 06/15/17     PT SHORT TERM GOAL #4   Title Pt will return demonstrate habituation, gaze adaptation and balance HEP with supervision   Time 4   Period Weeks   Status New   Target Date 06/15/17           PT Long Term Goals - 05/27/17 1241      PT LONG TERM GOAL #1   Title Pt will demonstrate independence with balance, habituation and gaze adaptation HEP   Time 8   Period Weeks   Status New   Target Date 07/15/17     PT LONG TERM GOAL #2   Title Pt will demonstrate improved VOR as indicated by DVA of 3-4 line difference   Baseline unable to accurately assess due to blurry vision and significant neck guarding; if able to reassess later will re-instate goal   Status Deferred     PT LONG TERM GOAL #3   Title Pt will report 0/10 vertigo with positional testing, sit > stand and looking up   Time 8   Period Weeks   Status New     PT LONG TERM GOAL #4   Title Pt will decrease falls risk during gait in community as indicated by gait velocity of >3.53ft/sec   Baseline 3.21ft/sec   Time 8   Period Weeks   Status Revised   Target Date 07/15/17     PT LONG TERM GOAL #5   Title Pt will improve balance during dynamic gait challenges as indicated by FGA score of > or = 22/30   Baseline 14/30   Time 8   Period Weeks   Status Revised   Target Date 07/15/17     PT LONG TERM GOAL #6   Title Pt will  improve DHI score by 18 points to decrease severity of handicap   Baseline 52 moderate   Time 8   Period Weeks   Status New   Target Date 07/15/17               Plan - 05/27/17 1215    Clinical Impression Statement Continued assessment of peripheral canals due to pt reporting ongoing spinning; upon re-assessment of canals pt reported very brief, mild spinning in R hallpike-dix but no nystagmus able to be seen in room light (frenzel's not available).  Treated for R canalithiasis with improvement in symptoms-will continue to assess and treat as indicated.  Performed DVA, FGA and gait velocity but unable to accurately assess DVA due to blurry vision with distance and neck guarding-pt advised to follow up with eye doctor.  Pt does demonstrate increased falls risk during dynamic gait activities as indicated by FGA.  At next session plan to assess SOT to determine pt's ability to use all sensory systems for balance.  Will continue to assess and initiate HEP at next session.   Rehab Potential Good   PT Frequency 2x / week   PT Duration 8 weeks   PT Treatment/Interventions ADLs/Self Care Home Management;Canalith Repostioning;Gait training;Functional mobility training;Therapeutic activities;Therapeutic exercise;Neuromuscular re-education;Balance training;Patient/family education;Vestibular   PT Next Visit Plan ASSESS VITALS EACH VISIT; re-assess L and R anterior canals with Frenzel lenses; treat if indicated.  Assess SOT and set goal if indicated.  initiate HEP   PT Home  Exercise Plan habituation, gaze adaptation, balance, treatment of cupulolithiasis   Consulted and Agree with Plan of Care Patient      Patient will benefit from skilled therapeutic intervention in order to improve the following deficits and impairments:  Decreased balance, Dizziness, Difficulty walking  Visit Diagnosis: Dizziness and giddiness  BPPV (benign paroxysmal positional vertigo), left  Unsteadiness on  feet     Problem List Patient Active Problem List   Diagnosis Date Noted  . Sleep apnea 02/22/2017  . Memory loss 10/23/2014  . Essential hypertension 10/23/2014  . Hyperlipidemia 10/23/2014  . Type 2 diabetes mellitus with complication (HCC) 10/23/2014  . Diastasis recti 03/02/2013  . Constipation, chronic 03/02/2013    Edman CircleAudra Hall, PT, DPT 05/27/17    12:52 PM    [facility] [address] [place], [place], [address] Phone: [phone]   Fax:  [phone]  Name: Savannah Harris MRN: [id number] Date of Birth: 12/25/1935

## 2017-05-31 ENCOUNTER — Encounter: Payer: Self-pay | Admitting: Physical Therapy

## 2017-05-31 ENCOUNTER — Ambulatory Visit: Payer: Medicare Other | Admitting: Physical Therapy

## 2017-05-31 DIAGNOSIS — H8112 Benign paroxysmal vertigo, left ear: Secondary | ICD-10-CM

## 2017-05-31 DIAGNOSIS — R42 Dizziness and giddiness: Secondary | ICD-10-CM | POA: Diagnosis not present

## 2017-05-31 DIAGNOSIS — R2681 Unsteadiness on feet: Secondary | ICD-10-CM

## 2017-05-31 NOTE — Patient Instructions (Signed)
Habituation - Tip Card  1.The goal of habituation training is to assist in decreasing symptoms of vertigo, dizziness, or nausea provoked by specific head and body motions. 2.These exercises may initially increase symptoms; however, be persistent and work through symptoms. With repetition and time, the exercises will assist in reducing or eliminating symptoms. 3.Exercises should be stopped and discussed with the therapist if you experience any of the following: - Sudden change or fluctuation in hearing - New onset of ringing in the ears, or increase in current intensity - Any fluid discharge from the ear - Severe pain in neck or back - Extreme nausea   Habituation - Sit to Side-Lying   Sit on edge of bed. Lie down onto the right side and hold until dizziness stops, plus 20 seconds.  Return to sitting and wait until dizziness stops, plus 20 seconds.  Repeat to the left side. Repeat sequence 3 times per session. Do 1 sessions per day in the morning.  Copyright  V[employer]. All rights reserved.

## 2017-06-01 NOTE — Therapy (Signed)
[facility] [address] [place], [place], [address] Phone: [phone]   Fax:  [phone]  Physical Therapy Treatment  Patient Details  Name: Savannah Harris MRN: [id number] Date of Birth: 04/04/1936 Referring Provider: Fulton Reek, MD  Encounter Date: 05/31/2017      PT End of Session - 06/01/17 1503    Visit Number 3   Number of Visits 17   Date for PT Re-Evaluation 07/15/17   Authorization Type [employer] Medicare-G code and 10th visit progress note   PT Start Time 1530   PT Stop Time 1616   PT Time Calculation (min) 46 min   Activity Tolerance Patient tolerated treatment well   Behavior During Therapy [facility] for tasks assessed/performed      Past Medical History:  Diagnosis Date  . Arthritis    WRIST/ HANDS  . Asymptomatic vertebral artery stenosis    RIGHT SIDE  . Diverticulitis 11-30-13   during Christmas holiday- resolved  . GERD (gastroesophageal reflux disease)   . Hyperlipidemia   . Hypertension   . Leg cramps    OCCASIONAL  . Sleep apnea 11-30-13   mild-no cpap.  . Type 2 diabetes mellitus (HCC)   . Urge urinary incontinence     Past Surgical History:  Procedure Laterality Date  . CATARACT EXTRACTION W/ INTRAOCULAR LENS  IMPLANT, BILATERAL    . COLONOSCOPY WITH PROPOFOL N/A 12/15/2013   Procedure: COLONOSCOPY WITH PROPOFOL;  Surgeon: Charolett Bumpers, MD;  Location: [facility];  Service: Endoscopy;  Laterality: N/A;  . INTERSTIM IMPLANT PLACEMENT N/A 07/28/2013   Procedure: Leane Platt IMPLANT FIRST STAGE;  Surgeon: Martina Sinner, MD;  Location: [facility];  Service: Urology;  Laterality: N/A;  . INTERSTIM IMPLANT PLACEMENT N/A 07/28/2013   Procedure: Leane Platt IMPLANT SECOND STAGE;  Surgeon: Martina Sinner, MD;  Location: [facility] ;  Service: Urology;  Laterality: N/A;  . LEFT WRSIT TENDON REPAIR  1999  . PUBOVAGINAL SLING  08-26-2007   SPARC SLING   . TONSILLECTOMY AND ADENOIDECTOMY  AS CHILD  . TOTAL ABDOMINAL HYSTERECTOMY W/ BILATERAL SALPINGOOPHORECTOMY  1970s   AND APPENDECTOMY  . TOTAL HIP ARTHROPLASTY Right 12-16-2008    There were no vitals filed for this visit.      Subjective Assessment - 05/31/17 1542    Subjective Pt has not had dizziness/spinning in 5 days.  Still reporting issues with cognition; questioning if she should have repeat sleep study.  No falls.     Pertinent History memory loss, hearing loss and tinnitus, HTN, DM, OSA, migraine headaches, osteopenia, OA, chronic kidney disease, THR, fall with elbow fracture   Limitations Standing;Walking   Patient Stated Goals To figure out where the dizziness is coming from   Currently in Pain? No/denies                Vestibular Assessment - 05/31/17 1543      Positional Testing   Sidelying Test Sidelying Right;Sidelying Left     Sidelying Right   Sidelying Right Duration 30 seconds   Sidelying Right Symptoms Right nystagmus     Sidelying Left   Sidelying Left Duration 0   Sidelying Left Symptoms No nystagmus     Horizontal Canal Right   Horizontal Canal Right Duration 0   Horizontal Canal Right Symptoms Normal     Horizontal Canal Left   Horizontal Canal Left Duration 0   Horizontal Canal Left Symptoms Normal  Vestibular Treatment/Exercise - 05/31/17 1555      Vestibular Treatment/Exercise   Vestibular Treatment Provided Habituation   Habituation Exercises Gari Crown Daroff   Number of Reps  2   Symptom Description  dizziness reported when returning from R side each time               PT Education - 06/01/17 1502    Education provided Yes   Education Details habituation HEP   Person(s) Educated Patient   Methods Explanation   Comprehension Verbalized understanding          PT Short Term Goals - 05/27/17 1240      PT SHORT TERM GOAL #1   Title Pt will tolerate assessment of DVA,  gait velocity and FGA with LTG to be revised   Time 4   Status Achieved     PT SHORT TERM GOAL #2   Title Pt will report dizziness as <2/10 during sit > stand and when looking up   Time 4   Period Weeks   Status New   Target Date 06/15/17     PT SHORT TERM GOAL #3   Title Pt will tolerate treatment for anterior canal cupulolithiasis   Time 4   Period Weeks   Status New   Target Date 06/15/17     PT SHORT TERM GOAL #4   Title Pt will return demonstrate habituation, gaze adaptation and balance HEP with supervision   Time 4   Period Weeks   Status New   Target Date 06/15/17           PT Long Term Goals - 05/27/17 1241      PT LONG TERM GOAL #1   Title Pt will demonstrate independence with balance, habituation and gaze adaptation HEP   Time 8   Period Weeks   Status New   Target Date 07/15/17     PT LONG TERM GOAL #2   Title Pt will demonstrate improved VOR as indicated by DVA of 3-4 line difference   Baseline unable to accurately assess due to blurry vision and significant neck guarding; if able to reassess later will re-instate goal   Status Deferred     PT LONG TERM GOAL #3   Title Pt will report 0/10 vertigo with positional testing, sit > stand and looking up   Time 8   Period Weeks   Status New     PT LONG TERM GOAL #4   Title Pt will decrease falls risk during gait in community as indicated by gait velocity of >3.63ft/sec   Baseline 3.52ft/sec   Time 8   Period Weeks   Status Revised   Target Date 07/15/17     PT LONG TERM GOAL #5   Title Pt will improve balance during dynamic gait challenges as indicated by FGA score of > or = 22/30   Baseline 14/30   Time 8   Period Weeks   Status Revised   Target Date 07/15/17     PT LONG TERM GOAL #6   Title Pt will improve DHI score by 18 points to decrease severity of handicap   Baseline 52 moderate   Time 8   Period Weeks   Status New   Target Date 07/15/17               Plan - 06/01/17 1505     Clinical Impression Statement Treatment session focused on continued assessment of peripheral canals to  confirm R BPPV with use of frenzel lenses.  Pt presented with right beating nystagmus during R sidelying test but negative for nystagmus or vertigo during R and L roll test.  Pt only c/o vertigo when transitioning from sidelying to sit-provided pt with Austin Miles for habituation.  Reviewed and had pt return demonstrate sequence.  Will continue to assess with SOT at next visit.   Rehab Potential Good   PT Frequency 2x / week   PT Duration 8 weeks   PT Treatment/Interventions ADLs/Self Care Home Management;Canalith Repostioning;Gait training;Functional mobility training;Therapeutic activities;Therapeutic exercise;Neuromuscular re-education;Balance training;Patient/family education;Vestibular   PT Next Visit Plan ASSESS VITALS EACH VISIT; ASSESS SOT!! review brandt daroff.  re-assess L and R anterior canals with Frenzel lenses and treat if indicated.  initiate HEP   PT Home Exercise Plan habituation, gaze adaptation, balance, treatment of cupulolithiasis   Consulted and Agree with Plan of Care Patient      Patient will benefit from skilled therapeutic intervention in order to improve the following deficits and impairments:  Decreased balance, Dizziness, Difficulty walking  Visit Diagnosis: Dizziness and giddiness  BPPV (benign paroxysmal positional vertigo), left  Unsteadiness on feet     Problem List Patient Active Problem List   Diagnosis Date Noted  . Sleep apnea 02/22/2017  . Memory loss 10/23/2014  . Essential hypertension 10/23/2014  . Hyperlipidemia 10/23/2014  . Type 2 diabetes mellitus with complication (HCC) 10/23/2014  . Diastasis recti 03/02/2013  . Constipation, chronic 03/02/2013   Edman Circle, PT, DPT 06/01/17    3:12 PM    [facility] [address] [place], [place], [address] Phone: [phone]    Fax:  [phone]  Name: Savannah Harris MRN: [id number] Date of Birth: 1936/04/18

## 2017-06-03 ENCOUNTER — Other Ambulatory Visit: Payer: Self-pay | Admitting: Family Medicine

## 2017-06-03 DIAGNOSIS — Z1231 Encounter for screening mammogram for malignant neoplasm of breast: Secondary | ICD-10-CM

## 2017-06-13 ENCOUNTER — Ambulatory Visit: Payer: Medicare Other | Admitting: Rehabilitative and Restorative Service Providers"

## 2017-06-13 VITALS — BP 148/52

## 2017-06-13 DIAGNOSIS — H8112 Benign paroxysmal vertigo, left ear: Secondary | ICD-10-CM

## 2017-06-13 DIAGNOSIS — R2681 Unsteadiness on feet: Secondary | ICD-10-CM

## 2017-06-13 DIAGNOSIS — R42 Dizziness and giddiness: Secondary | ICD-10-CM | POA: Diagnosis not present

## 2017-06-13 NOTE — Therapy (Signed)
[facility] [address] [place], [place], [address] Phone: [phone]   Fax:  [phone]  Physical Therapy Treatment  Patient Details  Name: Savannah Harris MRN: [id number] Date of Birth: 1935/11/22 Referring Provider: Fulton Reek, MD  Encounter Date: 06/13/2017      PT End of Session - 06/13/17 2107    Visit Number 4   Number of Visits 17   Date for PT Re-Evaluation 07/15/17   Authorization Type UHC Medicare-G code and 10th visit progress note   PT Start Time 1100   PT Stop Time 1145   PT Time Calculation (min) 45 min   Activity Tolerance Patient tolerated treatment well   Behavior During Therapy [facility] for tasks assessed/performed      Past Medical History:  Diagnosis Date  . Arthritis    WRIST/ HANDS  . Asymptomatic vertebral artery stenosis    RIGHT SIDE  . Diverticulitis 11-30-13   during Christmas holiday- resolved  . GERD (gastroesophageal reflux disease)   . Hyperlipidemia   . Hypertension   . Leg cramps    OCCASIONAL  . Sleep apnea 11-30-13   mild-no cpap.  . Type 2 diabetes mellitus (HCC)   . Urge urinary incontinence     Past Surgical History:  Procedure Laterality Date  . CATARACT EXTRACTION W/ INTRAOCULAR LENS  IMPLANT, BILATERAL    . COLONOSCOPY WITH PROPOFOL N/A 12/15/2013   Procedure: COLONOSCOPY WITH PROPOFOL;  Surgeon: Charolett Bumpers, MD;  Location: [facility];  Service: Endoscopy;  Laterality: N/A;  . INTERSTIM IMPLANT PLACEMENT N/A 07/28/2013   Procedure: Leane Platt IMPLANT FIRST STAGE;  Surgeon: Martina Sinner, MD;  Location: [facility];  Service: Urology;  Laterality: N/A;  . INTERSTIM IMPLANT PLACEMENT N/A 07/28/2013   Procedure: Leane Platt IMPLANT SECOND STAGE;  Surgeon: Martina Sinner, MD;  Location: [facility];  Service: Urology;  Laterality: N/A;  . LEFT WRSIT TENDON REPAIR  1999  . PUBOVAGINAL SLING  08-26-2007   [facility] SLING   . TONSILLECTOMY AND ADENOIDECTOMY  AS CHILD  . TOTAL ABDOMINAL HYSTERECTOMY W/ BILATERAL SALPINGOOPHORECTOMY  1970s   AND APPENDECTOMY  . TOTAL HIP ARTHROPLASTY Right 12-16-2008    Vitals:   06/13/17 1107  BP: (!) 148/52        Subjective Assessment - 06/13/17 1103    Subjective The patient reports dizziness still present, but less intense.  She reports symptoms last for 1-2 minutes and she stands or sits to let things settle.  She notes looking up aggravates symptoms.   Pertinent History memory loss, hearing loss and tinnitus, HTN, DM, OSA, migraine headaches, osteopenia, OA, chronic kidney disease, THR, fall with elbow fracture   Patient Stated Goals To figure out where the dizziness is coming from   Currently in Pain? No/denies            [facility] PT Assessment - 06/13/17 2111      Standardized Balance Assessment   Balance Master Testing Sensory Organization Test     Balance Master Testing    Results Sensory organization testing performed with composite equilibrium score of 49% compared to age/height norms of 65%.  Patient scored WNLs use of somatosensory feedback, moderately diminished visual feedback (30% compared to 74% normative) and moderately dec'd use of vestibular feedback (30% compared to 50%).   She maintained COG anteriorly t/o testing and had "fall" (needed assist to recover) on 2 out of 3 trials of condition 4, 1 out of 3  trials of condition 5 and 1 out of 3 trials of condition 6.            Vestibular Assessment - 06/13/17 1126      Vestibular Assessment   General Observation Francee Piccolo daroff exercise does not bring on any dizziness at home, however she notes neck pain with exercise and lightheadedness at times with return to sit.  Denies sensation of vertigo with exercise.  PT held for today and will perform tomorrow to determine if want to continue to keep as part of HEP.     Positional Testing   Dix-Hallpike Dix-Hallpike Right;Dix-Hallpike Left   Sidelying  Test Sidelying Right;Sidelying Left     Dix-Hallpike Right   Dix-Hallpike Right Duration none   Dix-Hallpike Right Symptoms No nystagmus     Dix-Hallpike Left   Dix-Hallpike Left Duration none   Dix-Hallpike Left Symptoms No nystagmus     Horizontal Canal Right   Horizontal Canal Right Duration none   Horizontal Canal Right Symptoms Normal     Horizontal Canal Left   Horizontal Canal Left Duration none   Horizontal Canal Left Symptoms Normal                 [facility] Adult PT Treatment/Exercise - 06/13/17 2129      Neuro Re-ed    Neuro Re-ed Details  Corner balance exercises performing feet apart with head motion progressing to feet together, then feet apart with eyes closed progressing to feet together.           Vestibular Treatment/Exercise - 06/13/17 2136      Vestibular Treatment/Exercise   Vestibular Treatment Provided Habituation;Gaze   Habituation Exercises Austin Miles   Gaze Exercises X1 Viewing Horizontal     Austin Miles   Number of Reps  2   Symptom Description  Trace dizziness with return to sit, further described as "lightheaded" sensation.     X1 Viewing Horizontal   Foot Position Standing VOR x 1 viewing   Comments 20 seconds with cues on speed, visual fixation during motion.               PT Education - 06/13/17 1143    Education provided Yes   Education Details HEP:  balance exercise on pillow eyes open + head motion, on pillow + eyes closed, gaze x 1 adaptation x 20 sec   Person(s) Educated Patient   Methods Explanation;Demonstration;Handout   Comprehension Verbalized understanding;Returned demonstration          PT Short Term Goals - 05/27/17 1240      PT SHORT TERM GOAL #1   Title Pt will tolerate assessment of DVA, gait velocity and FGA with LTG to be revised   Time 4   Status Achieved     PT SHORT TERM GOAL #2   Title Pt will report dizziness as <2/10 during sit > stand and when looking up   Time 4   Period Weeks    Status New   Target Date 06/15/17     PT SHORT TERM GOAL #3   Title Pt will tolerate treatment for anterior canal cupulolithiasis   Time 4   Period Weeks   Status New   Target Date 06/15/17     PT SHORT TERM GOAL #4   Title Pt will return demonstrate habituation, gaze adaptation and balance HEP with supervision   Time 4   Period Weeks   Status New   Target Date 06/15/17  PT Long Term Goals - 05/27/17 1241      PT LONG TERM GOAL #1   Title Pt will demonstrate independence with balance, habituation and gaze adaptation HEP   Time 8   Period Weeks   Status New   Target Date 07/15/17     PT LONG TERM GOAL #2   Title Pt will demonstrate improved VOR as indicated by DVA of 3-4 line difference   Baseline unable to accurately assess due to blurry vision and significant neck guarding; if able to reassess later will re-instate goal   Status Deferred     PT LONG TERM GOAL #3   Title Pt will report 0/10 vertigo with positional testing, sit > stand and looking up   Time 8   Period Weeks   Status New     PT LONG TERM GOAL #4   Title Pt will decrease falls risk during gait in community as indicated by gait velocity of >3.616ft/sec   Baseline 3.413ft/sec   Time 8   Period Weeks   Status Revised   Target Date 07/15/17     PT LONG TERM GOAL #5   Title Pt will improve balance during dynamic gait challenges as indicated by FGA score of > or = 22/30   Baseline 14/30   Time 8   Period Weeks   Status Revised   Target Date 07/15/17     PT LONG TERM GOAL #6   Title Pt will improve DHI score by 18 points to decrease severity of handicap   Baseline 52 moderate   Time 8   Period Weeks   Status New   Target Date 07/15/17               Plan - 06/13/17 2152    Clinical Impression Statement Today's session emphasized completion of SOT, instruction in HEP and gaze.  PT held brandt daroff today due to low blood pressure and report of more "lightheaded" versus dizzy  sensation with return to sit.  PT to further assess tomorrow and add back to HEP if indicated.    PT Treatment/Interventions ADLs/Self Care Home Management;Canalith Repostioning;Gait training;Functional mobility training;Therapeutic activities;Therapeutic exercise;Neuromuscular re-education;Balance training;Patient/family education;Vestibular   PT Next Visit Plan ASSESS VITALS EACH VISIT; check brandt daroff.  re-assess L and R anterior canals with Frenzel lenses and treat if indicated.  Check HEP.   Consulted and Agree with Plan of Care Patient      Patient will benefit from skilled therapeutic intervention in order to improve the following deficits and impairments:  Decreased balance, Dizziness, Difficulty walking  Visit Diagnosis: Dizziness and giddiness  BPPV (benign paroxysmal positional vertigo), left  Unsteadiness on feet     Problem List Patient Active Problem List   Diagnosis Date Noted  . Sleep apnea 02/22/2017  . Memory loss 10/23/2014  . Essential hypertension 10/23/2014  . Hyperlipidemia 10/23/2014  . Type 2 diabetes mellitus with complication (HCC) 10/23/2014  . Diastasis recti 03/02/2013  . Constipation, chronic 03/02/2013    Loralei Radcliffe, PT 06/13/2017, 9:58 PM  [facility] [address] [place], [place], [address] Phone: [phone]   Fax:  [phone]  Name: Savannah Harris MRN: [id number] Date of Birth: 18-Dec-1935

## 2017-06-13 NOTE — Patient Instructions (Signed)
Gaze Stabilization - Tip Card  1.Target must remain in focus, not blurry, and appear stationary while head is in motion. 2.Perform exercises with small head movements (45 to either side of midline). 3.Increase speed of head motion so long as target is in focus. 4.If you wear eyeglasses, be sure you can see target through lens (therapist will give specific instructions for bifocal / progressive lenses). 5.These exercises may provoke dizziness or nausea. Work through these symptoms. If too dizzy, slow head movement slightly. Rest between each exercise. 6.Exercises demand concentration; avoid distractions. 7.For safety, perform standing exercises close to a counter, wall, corner, or next to someone.  Copyright  VHI. All rights reserved.   Gaze Stabilization - Standing Feet Apart   Feet shoulder width apart, keeping eyes on target on wall 3 feet away, tilt head down slightly and move head side to side for 20 seconds. Repeat while moving head up and down for 20 seconds. *Work up to tolerating 60 seconds, as able. Do 2-3 sessions per day.   Copyright  VHI. All rights reserved.     Feet Together (Compliant Surface) Head Motion - Eyes Open    With eyes open, standing on compliant surface: __pillow___, feet together, move head slowly: side to side 5 times.  Do __2__ sessions per day.  Copyright  VHI. All rights reserved.   Feet Together (Compliant Surface) Varied Arm Positions - Eyes Closed    Stand on compliant surface: _pillow___ with feet together and arms out. Close eyes and visualize upright position. Hold__30__ seconds. Repeat _3___ times per session. Do _2___ sessions per day.  Copyright  VHI. All rights reserved.

## 2017-06-14 ENCOUNTER — Ambulatory Visit: Payer: Medicare Other | Admitting: Rehabilitative and Restorative Service Providers"

## 2017-06-14 VITALS — BP 153/78 | HR 65

## 2017-06-14 DIAGNOSIS — R42 Dizziness and giddiness: Secondary | ICD-10-CM | POA: Diagnosis not present

## 2017-06-14 DIAGNOSIS — R2681 Unsteadiness on feet: Secondary | ICD-10-CM

## 2017-06-14 DIAGNOSIS — H8112 Benign paroxysmal vertigo, left ear: Secondary | ICD-10-CM
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
## 2017-06-19 ENCOUNTER — Ambulatory Visit: Payer: Medicare Other | Attending: Internal Medicine | Admitting: Rehabilitative and Restorative Service Providers"

## 2017-06-19 DIAGNOSIS — R42 Dizziness and giddiness: Secondary | ICD-10-CM | POA: Diagnosis present

## 2017-06-19 DIAGNOSIS — H8112 Benign paroxysmal vertigo, left ear: Secondary | ICD-10-CM | POA: Diagnosis present

## 2017-06-19 DIAGNOSIS — R2681 Unsteadiness on feet: Secondary | ICD-10-CM

## 2017-06-19 NOTE — Patient Instructions (Signed)
Blood Pressure Record Sheet Your blood pressure on this visit to the emergency department or clinic is elevated. This does not necessarily mean you have high blood pressure (hypertension), but it does mean that your blood pressure needs to be rechecked. Many times your blood pressure can increase due to illness, pain, anxiety, or other factors. We recommend that you get a series of blood pressure readings done over a period of 5 days. It is best to get a reading in the morning and one in the evening. You should make sure to sit and relax for 1-5 minutes before the reading is taken. Write the readings down and make a follow-up appointment with your health care provider to discuss the results. If there is not a free clinic or a drug store with a blood-pressure-taking machine near you, you can purchase blood-pressure-taking equipment from a drug store. Having one in the home allows you the convenience of taking your blood pressure while you are home and relaxed. Blood Pressure Log Date: _______________________  a.m. _____________________  p.m. _____________________  Date: _______________________  a.m. _____________________  p.m. _____________________  Date: _______________________  a.m. _____________________  p.m. _____________________  Date: _______________________  a.m. _____________________  p.m. _____________________  Date: _______________________  a.m. _____________________  p.m. _____________________  This information is not intended to replace advice given to you by your health care provider. Make sure you discuss any questions you have with your health care provider. Document Released: 06/30/2003 Document Revised: 09/14/2016 Document Reviewed: 11/24/2013 Elsevier Interactive Patient Education  2018 [employer]  

## 2017-06-19 NOTE — Therapy (Signed)
[facility] [address] [place], [place], [address] Phone: [phone]   Fax:  [phone]  Physical Therapy Treatment  Patient Details  Name: Savannah Harris MRN: [id number] Date of Birth: [birth year] Referring Provider: Sabra Heck, MD  Encounter Date: 06/19/2017      PT End of Session - 06/19/17 1427    Visit Number 6   Number of Visits 17   Date for PT Re-Evaluation 07/15/17   Authorization Type U[employer] [place] code and 10th visit progress note   PT Start Time 1150   PT Stop Time 1230   PT Time Calculation (min) 40 min   Equipment Utilized During Treatment --   Activity Tolerance Patient tolerated treatment well   Behavior During Therapy [facility] for tasks assessed/performed      Past Medical History:  Diagnosis Date  . Arthritis    WRIST/ HANDS  . Asymptomatic vertebral artery stenosis    RIGHT SIDE  . Diverticulitis 11-30-13   during Christmas holiday- resolved  . GERD (gastroesophageal reflux disease)   . Hyperlipidemia   . Hypertension   . Leg cramps    OCCASIONAL  . Sleep apnea 11-30-13   mild-no cpap.  . Type 2 diabetes mellitus ([place])   . Urge urinary incontinence     Past Surgical History:  Procedure Laterality Date  . CATARACT EXTRACTION W/ INTRAOCULAR LENS  IMPLANT, BILATERAL    . COLONOSCOPY WITH PROPOFOL N/A 12/15/2013   Procedure: COLONOSCOPY WITH PROPOFOL;  Surgeon: Garlan Fair, MD;  Location: [facility];  Service: Endoscopy;  Laterality: N/A;  . INTERSTIM IMPLANT PLACEMENT N/A 07/28/2013   Procedure: Barrie Lyme IMPLANT FIRST STAGE;  Surgeon: Reece Packer, MD;  [facility];  Service: Urology;  Laterality: N/A;  . INTERSTIM IMPLANT PLACEMENT N/A 07/28/2013   Procedure: Barrie Lyme IMPLANT SECOND STAGE;  Surgeon: Reece Packer, MD;  Location: [facility];  Service: Urology;  Laterality: N/A;  . LEFT WRSIT TENDON REPAIR  1999  .  PUBOVAGINAL SLING  08-26-2007   [place] SLING  . TONSILLECTOMY AND ADENOIDECTOMY  AS CHILD  . TOTAL ABDOMINAL HYSTERECTOMY W/ BILATERAL SALPINGOOPHORECTOMY  1970s   AND APPENDECTOMY  . TOTAL HIP ARTHROPLASTY Right 12-16-2008    There were no vitals filed for this visit.      Subjective Assessment - 06/19/17 1151    Subjective The patient notes a dizzy spell last Friday when attempting to walk into church carrying a covered dish and her purse.  She notes a dizzy spell and off balance sensation.  The patient notes it lasted 5-10 minutes and then she was able to walk in carrying the covered dish.   Saturday during a quick turn, patient noted she had to hold onto a wall for 30 seconds.  Monday noted after standing and walking, got dizzy when walking to bathroom--lasted 4-5 minutes before she could move again.  Every episode happened in the evening.  Tuesday at 9pm, patient noted dizzy after standing up to walk.     Pertinent History memory loss, hearing loss and tinnitus, HTN, DM, OSA, migraine headaches, osteopenia, OA, chronic kidney disease, THR, fall with elbow fracture   Limitations Standing;Walking   Patient Stated Goals To figure out where the dizziness is coming from   Currently in Pain? No/denies                Vestibular Assessment - 06/19/17 1157      Vestibular Assessment   General  Observation Each episode of dizziness over long weekend occurred after sitting for >30 minutes.       Visual Acuity   Static 3  without lenses--inaccurate from line 4 and below   Dynamic --  unable to reach eye chart with head motion today     Positional Testing   Dix-Hallpike Dix-Hallpike Right;Dix-Hallpike Left   Horizontal Canal Testing Horizontal Canal Right;Horizontal Canal Left     Dix-Hallpike Right   Dix-Hallpike Right Duration WITH FRENZEL LENSES DONNED FOR ALL TESTING TODAY, none   Dix-Hallpike Right Symptoms No nystagmus     Dix-Hallpike Left   Dix-Hallpike Left Duration  none   Dix-Hallpike Left Symptoms No nystagmus     Horizontal Canal Right   Horizontal Canal Right Duration none   Horizontal Canal Right Symptoms Normal     Horizontal Canal Left   Horizontal Canal Left Duration none   Horizontal Canal Left Symptoms Normal     Positional Sensitivities   Positional Sensitivities Comments Sit<>stand and looking up does not provoke symptoms today.     Orthostatics   BP supine (x 5 minutes) 124/80   HR supine (x 5 minutes) 65   BP standing (after 1 minute) --  110/?unable to hear   HR standing (after 1 minute) 75   BP standing (after 3 minutes) 118/72   HR standing (after 3 minutes) 769   Orthostatics Comment PT recommended patient log her blood pressure.                  [facility] Adult PT Treatment/Exercise - 06/19/17 1441      Self-Care   Self-Care Other Self-Care Comments   Other Self-Care Comments  Discussed plan of care with patient as she feels current HEP is easy (although notes double vision with gaze exercise). PT encouraged her to journal symptoms and discussed what information to include.  Also provided BP log to monitor as she has a home cuff.                  PT Education - 06/19/17 1425    Education provided Yes   Education Details Discussed blood pressure log and monitoring her BP during episodes of lightheaded/dizziness as each of the 4 times she experienced symptoms over the weekend were after sitting for >30 minutes and cleared within minutes.   Person(s) Educated Patient   Methods Explanation   Comprehension Verbalized understanding          PT Short Term Goals - 06/14/17 1544      PT SHORT TERM GOAL #1   Title Pt will tolerate assessment of DVA, gait velocity and FGA with LTG to be revised   Time 4   Status Achieved     PT SHORT TERM GOAL #2   Title Pt will report dizziness as <2/10 during sit > stand and when looking up   Baseline Pt performed sit<>stand x 10 solid surface and compliant surface and  standing with looking up.  No dizziness reported 06/14/17   Time 4   Period Weeks   Status Achieved     PT SHORT TERM GOAL #3   Title Pt will tolerate treatment for anterior canal cupulolithiasis   Baseline Performing habituation and has tolerated canolith repositioning.   Time 4   Period Weeks   Status Achieved     PT SHORT TERM GOAL #4   Title Pt will return demonstrate habituation, gaze adaptation and balance HEP with supervision   Baseline Met on 06/14/17  Time 4   Period Weeks   Status Achieved           PT Long Term Goals - 06/19/17 1431      PT LONG TERM GOAL #1   Title Pt will demonstrate independence with balance, habituation and gaze adaptation HEP   Time 8   Period Weeks   Status New   Target Date 07/15/17     PT LONG TERM GOAL #2   Title Pt will demonstrate improved VOR as indicated by DVA of 3-4 line difference   Baseline unable to accurately assess due to blurry vision and significant neck guarding; if able to reassess later will re-instate goal   Status Deferred     PT LONG TERM GOAL #3   Title Pt will report 0/10 vertigo with positional testing, sit > stand and looking up   Baseline Met on 06/19/17   Time 8   Period Weeks   Status Achieved     PT LONG TERM GOAL #4   Title Pt will decrease falls risk during gait in community as indicated by gait velocity of >3.58f/sec   Baseline Scores 3.63 ft/sec on 06/19/17   Time 8   Period Weeks   Status Achieved     PT LONG TERM GOAL #5   Title Pt will improve balance during dynamic gait challenges as indicated by FGA score of > or = 22/30   Baseline 14/30   Time 8   Period Weeks   Status Revised     PT LONG TERM GOAL #6   Title Pt will improve DHI score by 18 points to decrease severity of handicap   Baseline 52 moderate   Time 8   Period Weeks   Status New               Plan - 06/19/17 1433    Clinical Impression Statement The patient noted increased frequency of dizzy/lightheaded spells  over the weekend.  Recommended patient monitor/journal symptoms and also take BP to determine if there is a correlation in spells of dizzines and BP measurements.  Patient met LTG for no dizziness with positional testing and sit>stand looking up.    The patient notes double vision at times with gaze activities and impairments with vision could be contributing to multifactorial nature of symptoms.   PT Treatment/Interventions ADLs/Self Care Home Management;Canalith Repostioning;Gait training;Functional mobility training;Therapeutic activities;Therapeutic exercise;Neuromuscular re-education;Balance training;Patient/family education;Vestibular   PT Next Visit Plan Assess vitals; check Gaze HEP (notes double vision), Patient to bring symptom log--please review, Dynamic gait/balance activities  *Held Friday 9/7 appt due to it is her anniversary and to allow time for her to journal symptoms and BP.   Consulted and Agree with Plan of Care Patient      Patient will benefit from skilled therapeutic intervention in order to improve the following deficits and impairments:  Decreased balance, Dizziness, Difficulty walking  Visit Diagnosis: Unsteadiness on feet  BPPV (benign paroxysmal positional vertigo), left  Dizziness and giddiness     Problem List Patient Active Problem List   Diagnosis Date Noted  . Sleep apnea 02/22/2017  . Memory loss 10/23/2014  . Essential hypertension 10/23/2014  . Hyperlipidemia 10/23/2014  . Type 2 diabetes mellitus with complication ([place] 027/12/5007 . Diastasis recti 03/02/2013  . Constipation, chronic 03/02/2013    Ziya Coonrod , PT 06/19/2017, 2:44 PM  [place] [address]Phone: [phone]  Fax:  [phone]  Name: Savannah Harris MRN: [id number] Date of Birth: Jan 27, 1936

## 2017-06-21 ENCOUNTER — Encounter: Payer: Medicare Other | Admitting: Physical Therapy

## 2017-06-26 ENCOUNTER — Ambulatory Visit: Payer: Medicare Other | Admitting: Physical Therapy

## 2017-06-26 DIAGNOSIS — R2681 Unsteadiness on feet: Secondary | ICD-10-CM

## 2017-06-26 DIAGNOSIS — H8112 Benign paroxysmal vertigo, left ear: Secondary | ICD-10-CM

## 2017-06-26 DIAGNOSIS — R42 Dizziness and giddiness: Secondary | ICD-10-CM

## 2017-06-26 NOTE — Therapy (Signed)
[facility] [address] [place], [place], [address] Phone: [phone]   Fax:  [phone]  Physical Therapy Treatment  Patient Details  Name: Savannah Harris MRN: [id number] Date of Birth: 02-03-1936 Referring Provider: Sabra Heck, MD  Encounter Date: 06/26/2017      PT End of Session - 06/26/17 1313    Visit Number 7   Number of Visits 17   Date for PT Re-Evaluation 07/15/17   Authorization Type UHC [place] code and 10th visit progress note   PT Start Time 1020   PT Stop Time 1101   PT Time Calculation (min) 41 min   Activity Tolerance Patient tolerated treatment well   Behavior During Therapy [facility] for tasks assessed/performed      Past Medical History:  Diagnosis Date  . Arthritis    WRIST/ HANDS  . Asymptomatic vertebral artery stenosis    RIGHT SIDE  . Diverticulitis 11-30-13   during Christmas holiday- resolved  . GERD (gastroesophageal reflux disease)   . Hyperlipidemia   . Hypertension   . Leg cramps    OCCASIONAL  . Sleep apnea 11-30-13   mild-no cpap.  . Type 2 diabetes mellitus ([place])   . Urge urinary incontinence     Past Surgical History:  Procedure Laterality Date  . CATARACT EXTRACTION W/ INTRAOCULAR LENS  IMPLANT, BILATERAL    . COLONOSCOPY WITH PROPOFOL N/A 12/15/2013   Procedure: COLONOSCOPY WITH PROPOFOL;  Surgeon: Garlan Fair, MD;  Location: [facility];  Service: Endoscopy;  Laterality: N/A;  . INTERSTIM IMPLANT PLACEMENT N/A 07/28/2013   Procedure: Barrie Lyme IMPLANT FIRST STAGE;  Surgeon: Reece Packer, MD;  Location: [facility];  Service: Urology;  Laterality: N/A;  . INTERSTIM IMPLANT PLACEMENT N/A 07/28/2013   Procedure: Barrie Lyme IMPLANT SECOND STAGE;  Surgeon: Reece Packer, MD;  Location: [facility];  Service: Urology;  Laterality: N/A;  . LEFT WRSIT TENDON REPAIR  1999  . PUBOVAGINAL SLING  08-26-2007   [place] SLING   . TONSILLECTOMY AND ADENOIDECTOMY  AS CHILD  . TOTAL ABDOMINAL HYSTERECTOMY W/ BILATERAL SALPINGOOPHORECTOMY  1970s   AND APPENDECTOMY  . TOTAL HIP ARTHROPLASTY Right 12-16-2008    There were no vitals filed for this visit.      Subjective Assessment - 06/26/17 1258    Subjective Pt returns with log of am and pm BP; BP low initially in am and then by pm systolic is slightly elevated but diastolic remains in the 94-70 range.  Most episodes of dizziness have occured in the evening after prolonged sitting, after dinner.  She reports another episode of double vision while sitting in church, "the lady in front of me had two heads."   Pertinent History memory loss, hearing loss and tinnitus, HTN, DM, OSA, migraine headaches, osteopenia, OA, chronic kidney disease, THR, fall with elbow fracture   Limitations Standing;Walking   Patient Stated Goals To figure out where the dizziness is coming from   Currently in Pain? No/denies                         [facility] Adult PT Treatment/Exercise - 06/26/17 1308      Self-Care   Self-Care Other Self-Care Comments   Other Self-Care Comments  Continued discussion with pt regarding BP daily patterns and medications to determine if there is a pattern that would explain her intermittent, sudden dizziness that occurs mostly in the evening after prolonged sitting.  Systolic BP is elevated by end of the day but diastolic remains significantly low-39-59.  Pt also reports having difficulty managing her CBG right now; reports CBG is high every morning.  Pt does not check CBG in pm.  Provided pt with another log to keep assessing BP in am and pm as well as CBG in am and pm to determine any patterns that pt could discuss with MD.  Will continue to assess.         Vestibular Treatment/Exercise - 06/26/17 1053      Vestibular Treatment/Exercise   Vestibular Treatment Provided Gaze   Gaze Exercises X1 Viewing Horizontal;X1 Viewing Vertical     X1  Viewing Horizontal   Foot Position standing, feet apart   Reps 2   Comments 20 sec progressing to 30 sec due to no diplopia; cues for head ROM     X1 Viewing Vertical   Foot Position standing feet apart   Reps 1   Comments 30 seconds, no diplopia               PT Education - 06/26/17 1313    Education provided Yes   Education Details BP and CBG log over next 4-5 days   Person(s) Educated Patient   Methods Explanation;Handout   Comprehension Verbalized understanding          PT Short Term Goals - 06/14/17 1544      PT SHORT TERM GOAL #1   Title Pt will tolerate assessment of DVA, gait velocity and FGA with LTG to be revised   Time 4   Status Achieved     PT SHORT TERM GOAL #2   Title Pt will report dizziness as <2/10 during sit > stand and when looking up   Baseline Pt performed sit<>stand x 10 solid surface and compliant surface and standing with looking up.  No dizziness reported 06/14/17   Time 4   Period Weeks   Status Achieved     PT SHORT TERM GOAL #3   Title Pt will tolerate treatment for anterior canal cupulolithiasis   Baseline Performing habituation and has tolerated canolith repositioning.   Time 4   Period Weeks   Status Achieved     PT SHORT TERM GOAL #4   Title Pt will return demonstrate habituation, gaze adaptation and balance HEP with supervision   Baseline Met on 06/14/17   Time 4   Period Weeks   Status Achieved           PT Long Term Goals - 06/19/17 1431      PT LONG TERM GOAL #1   Title Pt will demonstrate independence with balance, habituation and gaze adaptation HEP   Time 8   Period Weeks   Status New   Target Date 07/15/17     PT LONG TERM GOAL #2   Title Pt will demonstrate improved VOR as indicated by DVA of 3-4 line difference   Baseline unable to accurately assess due to blurry vision and significant neck guarding; if able to reassess later will re-instate goal   Status Deferred     PT LONG TERM GOAL #3   Title Pt  will report 0/10 vertigo with positional testing, sit > stand and looking up   Baseline Met on 06/19/17   Time 8   Period Weeks   Status Achieved     PT LONG TERM GOAL #4   Title Pt will decrease falls risk during gait in community as indicated by gait  velocity of >3.71f/sec   Baseline Scores 3.63 ft/sec on 06/19/17   Time 8   Period Weeks   Status Achieved     PT LONG TERM GOAL #5   Title Pt will improve balance during dynamic gait challenges as indicated by FGA score of > or = 22/30   Baseline 14/30   Time 8   Period Weeks   Status Revised     PT LONG TERM GOAL #6   Title Pt will improve DHI score by 18 points to decrease severity of handicap   Baseline 52 moderate   Time 8   Period Weeks   Status New               Plan - 06/26/17 1707    Clinical Impression Statement Continued to review and discuss BP log, trends and possible causes for dizzy spells in the evening and concern over consistently low diastolic BP readings.  Requested that pt log BP again over next few days as well as CBG in am and pm to see if changes in CBG may play a role in dizziness episodes.  Reviewed x 1 viewing and able to progress to 30 seconds without diplopia.  Will continue to assess and progress as able towards LTG.   Rehab Potential Good   PT Frequency 2x / week   PT Duration 8 weeks   PT Treatment/Interventions ADLs/Self Care Home Management;Canalith Repostioning;Gait training;Functional mobility training;Therapeutic activities;Therapeutic exercise;Neuromuscular re-education;Balance training;Patient/family education;Vestibular   PT Next Visit Plan Assess vitals; Patient to bring symptom log of BP and CBG--please review, Dynamic gait/balance activities strengthening vestibular system   Consulted and Agree with Plan of Care Patient      Patient will benefit from skilled therapeutic intervention in order to improve the following deficits and impairments:  Decreased balance, Dizziness, Difficulty  walking  Visit Diagnosis: Unsteadiness on feet  BPPV (benign paroxysmal positional vertigo), left  Dizziness and giddiness     Problem List Patient Active Problem List   Diagnosis Date Noted  . Sleep apnea 02/22/2017  . Memory loss 10/23/2014  . Essential hypertension 10/23/2014  . Hyperlipidemia 10/23/2014  . Type 2 diabetes mellitus with complication ([place] 036/46/8032 . Diastasis recti 03/02/2013  . Constipation, chronic 03/02/2013    ARaylene Everts PT, DPT 06/26/17    5:15 PM    [place] [address]Phone: [phone]  Fax:  [phone] Name: SEZME DUCHMRN: [id number] of Birth: 110/22/1937

## 2017-06-26 NOTE — Patient Instructions (Addendum)
Blood Pressure Record Sheet Your blood pressure on this visit to the emergency department or clinic is elevated. This does not necessarily mean you have high blood pressure (hypertension), but it does mean that your blood pressure needs to be rechecked. Many times your blood pressure can increase due to illness, pain, anxiety, or other factors. We recommend that you get a series of blood pressure readings done over a period of 5 days. It is best to get a reading in the morning and one in the evening. You should make sure to sit and relax for 1-5 minutes before the reading is taken. Write the readings down and make a follow-up appointment with your health care provider to discuss the results. If there is not a free clinic or a drug store with a blood-pressure-taking machine near you, you can purchase blood-pressure-taking equipment from a drug store. Having one in the home allows you the convenience of taking your blood pressure while you are home and relaxed. Blood Pressure Log Date: _______________________  a.m. _____________________  p.m. _____________________  Date: _______________________  a.m. _____________________  p.m. _____________________  Date: _______________________  a.m. _____________________  p.m. _____________________  Date: _______________________  a.m. _____________________  p.m. _____________________  Date: _______________________  a.m. _____________________  p.m. _____________________  This information is not intended to replace advice given to you by your health care provider. Make sure you discuss any questions you have with your health care provider. Document Released: 06/30/2003 Document Revised: 09/14/2016 Document Reviewed: 11/24/2013 Elsevier Interactive Patient Education  2018 [employer]  

## 2017-06-28 ENCOUNTER — Encounter: Payer: Self-pay | Admitting: Physical Therapy

## 2017-06-28 ENCOUNTER — Ambulatory Visit: Payer: Medicare Other | Admitting: Physical Therapy

## 2017-06-28 VITALS — BP 133/68 | HR 64

## 2017-06-28 DIAGNOSIS — R42 Dizziness and giddiness: Secondary | ICD-10-CM

## 2017-06-28 DIAGNOSIS — R2681 Unsteadiness on feet: Secondary | ICD-10-CM | POA: Diagnosis not present

## 2017-06-28 NOTE — Therapy (Signed)
[facility] [address] [place], [place], [address] Phone: [phone]   Fax:  [phone]  Physical Therapy Treatment  Patient Details  Name: Savannah Harris MRN: [id number] Date of Birth: 08-09-1936 Referring Provider: Sabra Heck, MD  Encounter Date: 06/28/2017      PT End of Session - 06/28/17 1147    Visit Number 8   Number of Visits 17   Date for PT Re-Evaluation 07/15/17   Authorization Type [employer] [place] code and 10th visit progress note   PT Start Time 1100   PT Stop Time 1145   PT Time Calculation (min) 45 min   Activity Tolerance Patient tolerated treatment well   Behavior During Therapy [facility] for tasks assessed/performed      Past Medical History:  Diagnosis Date  . Arthritis    WRIST/ HANDS  . Asymptomatic vertebral artery stenosis    RIGHT SIDE  . Diverticulitis 11-30-13   during Christmas holiday- resolved  . GERD (gastroesophageal reflux disease)   . Hyperlipidemia   . Hypertension   . Leg cramps    OCCASIONAL  . Sleep apnea 11-30-13   mild-no cpap.  . Type 2 diabetes mellitus ([place])   . Urge urinary incontinence     Past Surgical History:  Procedure Laterality Date  . CATARACT EXTRACTION W/ INTRAOCULAR LENS  IMPLANT, BILATERAL    . COLONOSCOPY WITH PROPOFOL N/A 12/15/2013   Procedure: COLONOSCOPY WITH PROPOFOL;  Surgeon: Garlan Fair, MD;  Location: [facility];  Service: Endoscopy;  Laterality: N/A;  . INTERSTIM IMPLANT PLACEMENT N/A 07/28/2013   Procedure: Barrie Lyme IMPLANT FIRST STAGE;  Surgeon: Reece Packer, MD;  Location: [facility];  Service: Urology;  Laterality: N/A;  . INTERSTIM IMPLANT PLACEMENT N/A 07/28/2013   Procedure: Barrie Lyme IMPLANT SECOND STAGE;  Surgeon: Reece Packer, MD;  Location: [facility];  Service: Urology;  Laterality: N/A;  . LEFT WRSIT TENDON REPAIR  1999  . PUBOVAGINAL SLING  08-26-2007   [place] SLING   . TONSILLECTOMY AND ADENOIDECTOMY  AS CHILD  . TOTAL ABDOMINAL HYSTERECTOMY W/ BILATERAL SALPINGOOPHORECTOMY  1970s   AND APPENDECTOMY  . TOTAL HIP ARTHROPLASTY Right 12-16-2008    Vitals:   06/28/17 1105  BP: 133/68  Pulse: 64        Subjective Assessment - 06/28/17 1106    Subjective Pt had appointment with primary care doctor today to discuss BP and elevated CBG.  MD has increased her Metformin-doubled it.  Pt reported having a significant dizzy spell when she entered waiting area this morning.     Pertinent History memory loss, hearing loss and tinnitus, HTN, DM, OSA, migraine headaches, osteopenia, OA, chronic kidney disease, THR, fall with elbow fracture   Limitations Standing;Walking   Patient Stated Goals To figure out where the dizziness is coming from   Currently in Pain? No/denies                          Vestibular Treatment/Exercise - 06/28/17 1115      Vestibular Treatment/Exercise   Vestibular Treatment Provided Gaze   Gaze Exercises X1 Viewing Horizontal;X1 Viewing Vertical     X1 Viewing Horizontal   Foot Position standing-feet apart, feet together, R and L Partial tandem   Reps 4   Comments progressed to 45 seconds     X1 Viewing Vertical   Foot Position standing-feet apart, feet together, R and L Partial tandem  Reps 4   Comments progressed to 45 seconds            Balance Exercises - 06/28/17 1132      Balance Exercises: Standing   Gait with Head Turns Forward;4 reps  up/down, L and R    Tandem Gait Forward;Upper extremity support;4 reps   Other Standing Exercises Also performed walking with eyes closed with UE support on counter x 4 reps; performed walking with changes in gait speed along hallway from normal<>fast<>slow x 4 reps with supervision           PT Education - 06/28/17 1145    Education provided Yes   Education Details continue to monitor BP and CBG with increase in medications and note if there is a  change in dizziness   Person(s) Educated Patient   Methods Explanation   Comprehension Verbalized understanding          PT Short Term Goals - 06/14/17 1544      PT SHORT TERM GOAL #1   Title Pt will tolerate assessment of DVA, gait velocity and FGA with LTG to be revised   Time 4   Status Achieved     PT SHORT TERM GOAL #2   Title Pt will report dizziness as <2/10 during sit > stand and when looking up   Baseline Pt performed sit<>stand x 10 solid surface and compliant surface and standing with looking up.  No dizziness reported 06/14/17   Time 4   Period Weeks   Status Achieved     PT SHORT TERM GOAL #3   Title Pt will tolerate treatment for anterior canal cupulolithiasis   Baseline Performing habituation and has tolerated canolith repositioning.   Time 4   Period Weeks   Status Achieved     PT SHORT TERM GOAL #4   Title Pt will return demonstrate habituation, gaze adaptation and balance HEP with supervision   Baseline Met on 06/14/17   Time 4   Period Weeks   Status Achieved           PT Long Term Goals - 06/19/17 1431      PT LONG TERM GOAL #1   Title Pt will demonstrate independence with balance, habituation and gaze adaptation HEP   Time 8   Period Weeks   Status New   Target Date 07/15/17     PT LONG TERM GOAL #2   Title Pt will demonstrate improved VOR as indicated by DVA of 3-4 line difference   Baseline unable to accurately assess due to blurry vision and significant neck guarding; if able to reassess later will re-instate goal   Status Deferred     PT LONG TERM GOAL #3   Title Pt will report 0/10 vertigo with positional testing, sit > stand and looking up   Baseline Met on 06/19/17   Time 8   Period Weeks   Status Achieved     PT LONG TERM GOAL #4   Title Pt will decrease falls risk during gait in community as indicated by gait velocity of >3.23f/sec   Baseline Scores 3.63 ft/sec on 06/19/17   Time 8   Period Weeks   Status Achieved     PT  LONG TERM GOAL #5   Title Pt will improve balance during dynamic gait challenges as indicated by FGA score of > or = 22/30   Baseline 14/30   Time 8   Period Weeks   Status Revised     PT  LONG TERM GOAL #6   Title Pt will improve DHI score by 18 points to decrease severity of handicap   Baseline 52 moderate   Time 8   Period Weeks   Status New               Plan - 06/29/17 1024    Clinical Impression Statement Pt to continue to record BP and CBG in am and pm over the next week to determine if fluctuations in BP and CBG may be related to dizziness episodes.  Despite dizziness episode while in waiting area pt was able to tolerate continued focus on gaze adaptation x 1 viewing in standing with varying foot positions to further challenge balance.  Pt also able to tolerate higher level gait and balance challenges with reports of minor symptoms.  Will continue to monitor and address balance, gait and vestibular impairments.   Rehab Potential Good   PT Frequency 2x / week   PT Duration 8 weeks   PT Treatment/Interventions ADLs/Self Care Home Management;Canalith Repostioning;Gait training;Functional mobility training;Therapeutic activities;Therapeutic exercise;Neuromuscular re-education;Balance training;Patient/family education;Vestibular   PT Next Visit Plan G code due in 2 visits!  Assess vitals; Patient to bring symptom log of BP and CBG--please review, Dynamic gait/balance activities strengthening vestibular system   Consulted and Agree with Plan of Care Patient      Patient will benefit from skilled therapeutic intervention in order to improve the following deficits and impairments:  Decreased balance, Dizziness, Difficulty walking  Visit Diagnosis: Unsteadiness on feet  Dizziness and giddiness     Problem List Patient Active Problem List   Diagnosis Date Noted  . Sleep apnea 02/22/2017  . Memory loss 10/23/2014  . Essential hypertension 10/23/2014  . Hyperlipidemia  10/23/2014  . Type 2 diabetes mellitus with complication ([place]) 59/97/7414  . Diastasis recti 03/02/2013  . Constipation, chronic 03/02/2013   Raylene Everts, PT, DPT 06/29/17    10:51 AM    [facility] [address] [place], [place], [address] Phone: [phone]   Fax:  [phone]  Name: Savannah Harris MRN: [id number] Date of Birth: Feb 20, 1936

## 2017-07-01 ENCOUNTER — Encounter: Payer: Self-pay | Admitting: Physical Therapy

## 2017-07-01 ENCOUNTER — Ambulatory Visit: Payer: Medicare Other | Admitting: Physical Therapy

## 2017-07-01 VITALS — BP 116/74 | HR 79

## 2017-07-01 DIAGNOSIS — R2681 Unsteadiness on feet: Secondary | ICD-10-CM | POA: Diagnosis not present

## 2017-07-01 DIAGNOSIS — R42 Dizziness and giddiness: Secondary | ICD-10-CM

## 2017-07-01 NOTE — Patient Instructions (Addendum)
Blood Pressure Record Sheet Your blood pressure on this visit to the emergency department or clinic is elevated. This does not necessarily mean you have high blood pressure (hypertension), but it does mean that your blood pressure needs to be rechecked. Many times your blood pressure can increase due to illness, pain, anxiety, or other factors. We recommend that you get a series of blood pressure readings done over a period of 5 days. It is best to get a reading in the morning and one in the evening. You should make sure to sit and relax for 1-5 minutes before the reading is taken. Write the readings down and make a follow-up appointment with your health care provider to discuss the results. If there is not a free clinic or a drug store with a blood-pressure-taking machine near you, you can purchase blood-pressure-taking equipment from a drug store. Having one in the home allows you the convenience of taking your blood pressure while you are home and relaxed. Blood Pressure Log Date: _______________________  a.m. _____________________  p.m. _____________________  Date: _______________________  a.m. _____________________  p.m. _____________________  Date: _______________________  a.m. _____________________  p.m. _____________________  Date: _______________________  a.m. _____________________  p.m. _____________________  Date: _______________________  a.m. _____________________  p.m. _____________________  This information is not intended to replace advice given to you by your health care provider. Make sure you discuss any questions you have with your health care provider. Document Released: 06/30/2003 Document Revised: 09/14/2016 Document Reviewed: 11/24/2013 Elsevier Interactive Patient Education  2018 [employer]  

## 2017-07-01 NOTE — Therapy (Signed)
[facility] [address] [place], [place], [address] Phone: [phone]   Fax:  [phone]  Physical Therapy Treatment  Patient Details  Name: Savannah Harris MRN: [id number] Date of Birth: 16-Dec-1935 Referring Provider: Sabra Heck, MD  Encounter Date: 07/01/2017      PT End of Session - 07/01/17 1453    Visit Number 9   Number of Visits 17   Date for PT Re-Evaluation 07/15/17   Authorization Type [employer] [place] code and 10th visit progress note   PT Start Time 1320   PT Stop Time 1405   PT Time Calculation (min) 45 min   Activity Tolerance Patient tolerated treatment well   Behavior During Therapy [facility] for tasks assessed/performed      Past Medical History:  Diagnosis Date  . Arthritis    WRIST/ HANDS  . Asymptomatic vertebral artery stenosis    RIGHT SIDE  . Diverticulitis 11-30-13   during Christmas holiday- resolved  . GERD (gastroesophageal reflux disease)   . Hyperlipidemia   . Hypertension   . Leg cramps    OCCASIONAL  . Sleep apnea 11-30-13   mild-no cpap.  . Type 2 diabetes mellitus ([place])   . Urge urinary incontinence     Past Surgical History:  Procedure Laterality Date  . CATARACT EXTRACTION W/ INTRAOCULAR LENS  IMPLANT, BILATERAL    . COLONOSCOPY WITH PROPOFOL N/A 12/15/2013   Procedure: COLONOSCOPY WITH PROPOFOL;  Surgeon: Garlan Fair, MD;  Location: [facility];  Service: Endoscopy;  Laterality: N/A;  . INTERSTIM IMPLANT PLACEMENT N/A 07/28/2013   Procedure: Barrie Lyme IMPLANT FIRST STAGE;  Surgeon: Reece Packer, MD;  [facility];  Service: Urology;  Laterality: N/A;  . INTERSTIM IMPLANT PLACEMENT N/A 07/28/2013   Procedure: Barrie Lyme IMPLANT SECOND STAGE;  Surgeon: Reece Packer, MD;  Location: [facility];  Service: Urology;  Laterality: N/A;  . LEFT WRSIT TENDON REPAIR  1999  . PUBOVAGINAL SLING  08-26-2007   [facility] SLING   . TONSILLECTOMY AND ADENOIDECTOMY  AS CHILD  . TOTAL ABDOMINAL HYSTERECTOMY W/ BILATERAL SALPINGOOPHORECTOMY  1970s   AND APPENDECTOMY  . TOTAL HIP ARTHROPLASTY Right 12-16-2008    Vitals:   07/01/17 1328  BP: 116/74  Pulse: 79        Subjective Assessment - 07/01/17 1334    Subjective Pt had appt time wrong today and spent time at shopping center but didn't have time to eat lunch; feeling weak and tired now but not dizzy. Provided pt with PB crackers and cola while reviewing BP/CBG log.  On days pt noted dizziness pm CBG >200.   Pertinent History memory loss, hearing loss and tinnitus, HTN, DM, OSA, migraine headaches, osteopenia, OA, chronic kidney disease, THR, fall with elbow fracture   Limitations Standing;Walking   Patient Stated Goals To figure out where the dizziness is coming from   Currently in Pain? No/denies                          Vestibular Treatment/Exercise - 07/01/17 1336      Vestibular Treatment/Exercise   Gaze Exercises X1 Viewing Horizontal;X1 Viewing Vertical     X1 Viewing Horizontal   Foot Position standing feet apart, together   Reps 2   Comments progressed to 60 seconds, no diplopia     X1 Viewing Vertical   Foot Position standing feet apart, together   Reps 2   Comments  progressed to 60 seconds            Balance Exercises - 07/01/17 1351      Balance Exercises: Standing   Rockerboard Anterior/posterior;Head turns;EO;Other (comment);Intermittent UE support  x 10 reaching up and forwards, looking up-dizziness            PT Education - 07/01/17 1452    Education provided Yes   Education Details continue to monitor BP and CBG; increase x 1 viewing to 60 seconds   Person(s) Educated Patient   Methods Explanation;Demonstration   Comprehension Verbalized understanding;Returned demonstration          PT Short Term Goals - 06/14/17 1544      PT SHORT TERM GOAL #1   Title Pt will tolerate assessment of DVA,  gait velocity and FGA with LTG to be revised   Time 4   Status Achieved     PT SHORT TERM GOAL #2   Title Pt will report dizziness as <2/10 during sit > stand and when looking up   Baseline Pt performed sit<>stand x 10 solid surface and compliant surface and standing with looking up.  No dizziness reported 06/14/17   Time 4   Period Weeks   Status Achieved     PT SHORT TERM GOAL #3   Title Pt will tolerate treatment for anterior canal cupulolithiasis   Baseline Performing habituation and has tolerated canolith repositioning.   Time 4   Period Weeks   Status Achieved     PT SHORT TERM GOAL #4   Title Pt will return demonstrate habituation, gaze adaptation and balance HEP with supervision   Baseline Met on 06/14/17   Time 4   Period Weeks   Status Achieved           PT Long Term Goals - 07/01/17 1346      PT LONG TERM GOAL #1   Title Pt will demonstrate independence with balance, habituation and gaze adaptation HEP   Time 8   Period Weeks   Status New   Target Date 07/15/17     PT LONG TERM GOAL #2   Title Pt will demonstrate improved VOR as indicated by DVA of 3-4 line difference   Baseline unable to accurately assess due to blurry vision and significant neck guarding; if able to reassess later will re-instate goal   Status Deferred     PT LONG TERM GOAL #3   Title Pt will report 0/10 vertigo with positional testing, sit > stand and looking up   Baseline Met on 06/19/17 for positional testing; still having some dizziness sit > stand from prolonged lying or sitting and looking up   Time 8   Period Weeks   Status Partially Met   Target Date 07/15/17     PT LONG TERM GOAL #4   Title Pt will decrease falls risk during gait in community as indicated by gait velocity of >3.27f/sec   Baseline Scores 3.63 ft/sec on 06/19/17   Time 8   Period Weeks   Status Achieved     PT LONG TERM GOAL #5   Title Pt will improve balance during dynamic gait challenges as indicated by FGA  score of > or = 22/30   Baseline 14/30   Time 8   Period Weeks   Status Revised   Target Date 07/15/17     PT LONG TERM GOAL #6   Title Pt will improve DHI score by 18 points to decrease severity of handicap  Baseline 52 moderate   Time 8   Period Weeks   Status New   Target Date 07/15/17               Plan - 07/01/17 1453    Clinical Impression Statement Pt continues to report intermittent dizziness episodes at home which appear to coincide with elevated CBG >200.  Will continue to monitor as pt CBG should decrease due to increase in diabetes medication.  Pt able to tolerate increase in x1 viewing time to 60 seconds today with no reports of diplopia but does report mild dizziness with more narrow BOS.  Pt also continue to report dizziness with looking up and upon sit > stand.  Performed dynamic balance, weight shift and balance reaction training on rocker board with anterior/posterior weight shifts initially with bilat UE support progressing to one UE support and head turns vertically and horizontally.  Following multiple head nods pt did report a mild dizziness episode.  Will continue to address and progress towards LTG.   Rehab Potential Good   PT Frequency 2x / week   PT Duration 8 weeks   PT Treatment/Interventions ADLs/Self Care Home Management;Canalith Repostioning;Gait training;Functional mobility training;Therapeutic activities;Therapeutic exercise;Neuromuscular re-education;Balance training;Patient/family education;Vestibular   PT Next Visit Plan G code and PN due in 1 visit!  Assess vitals; Patient to bring symptom log of BP and CBG--please review, Dynamic gait/balance activities strengthening vestibular system   Consulted and Agree with Plan of Care Patient      Patient will benefit from skilled therapeutic intervention in order to improve the following deficits and impairments:  Decreased balance, Dizziness, Difficulty walking  Visit Diagnosis: Unsteadiness on  feet  Dizziness and giddiness     Problem List Patient Active Problem List   Diagnosis Date Noted  . Sleep apnea 02/22/2017  . Memory loss 10/23/2014  . Essential hypertension 10/23/2014  . Hyperlipidemia 10/23/2014  . Type 2 diabetes mellitus with complication ([place]) 32/67/1245  . Diastasis recti 03/02/2013  . Constipation, chronic 03/02/2013    Raylene Everts, PT, DPT 07/01/17    3:09 PM    [facility] [address] [place], [place], [address] Phone: [phone]   Fax:  [phone]  Name: Savannah Harris MRN: [id number] Date of Birth: 1936-06-22

## 2017-07-03 ENCOUNTER — Encounter: Payer: Medicare Other | Admitting: Physical Therapy

## 2017-07-05 ENCOUNTER — Ambulatory Visit: Payer: Medicare Other | Admitting: Physical Therapy

## 2017-07-05 DIAGNOSIS — R2681 Unsteadiness on feet: Secondary | ICD-10-CM

## 2017-07-05 DIAGNOSIS — R42 Dizziness and giddiness: Secondary | ICD-10-CM

## 2017-07-05 NOTE — Therapy (Signed)
[name] [address] [place], [place], [address] Phone: [phone]   Fax:  [phone]  Physical Therapy Treatment  Patient Details  Name: Savannah Harris MRN: [id number] Date of Birth: Dec 21, 1935 Referring Provider: Sabra Heck, MD  Encounter Date: 07/05/2017      PT End of Session - 07/05/17 1133    Visit Number 10   Number of Visits 17   Date for PT Re-Evaluation 07/15/17   Authorization Type [employer] [place] code and 10th visit progress note   PT Start Time 1104   PT Stop Time 1148   PT Time Calculation (min) 44 min   Activity Tolerance Patient tolerated treatment well   Behavior During Therapy [facility] for tasks assessed/performed      Past Medical History:  Diagnosis Date  . Arthritis    WRIST/ HANDS  . Asymptomatic vertebral artery stenosis    RIGHT SIDE  . Diverticulitis 11-30-13   during Christmas holiday- resolved  . GERD (gastroesophageal reflux disease)   . Hyperlipidemia   . Hypertension   . Leg cramps    OCCASIONAL  . Sleep apnea 11-30-13   mild-no cpap.  . Type 2 diabetes mellitus ([place])   . Urge urinary incontinence     Past Surgical History:  Procedure Laterality Date  . CATARACT EXTRACTION W/ INTRAOCULAR LENS  IMPLANT, BILATERAL    . COLONOSCOPY WITH PROPOFOL N/A 12/15/2013   Procedure: COLONOSCOPY WITH PROPOFOL;  Surgeon: Garlan Fair, MD;  Location: [facility];  Service: Endoscopy;  Laterality: N/A;  . INTERSTIM IMPLANT PLACEMENT N/A 07/28/2013   Procedure: [place] IMPLANT FIRST STAGE;  Surgeon: Reece Packer, MD;  Location: [facility];  Service: Urology;  Laterality: N/A;  . INTERSTIM IMPLANT PLACEMENT N/A 07/28/2013   Procedure: Barrie [place] IMPLANT SECOND STAGE;  Surgeon: Reece Packer, MD;  Location: [facility];  Service: Urology;  Laterality: N/A;  . LEFT WRSIT TENDON REPAIR  1999  . PUBOVAGINAL SLING  08-26-2007   [place] SLING   . TONSILLECTOMY AND ADENOIDECTOMY  AS CHILD  . TOTAL ABDOMINAL HYSTERECTOMY W/ BILATERAL SALPINGOOPHORECTOMY  1970s   AND APPENDECTOMY  . TOTAL HIP ARTHROPLASTY Right 12-16-2008    There were no vitals filed for this visit.      Subjective Assessment - 07/05/17 1253    Subjective Pt completed another BP and CBG log; noted to have lower CBG overall and only one mild episode of dizziness.  Pt stating she feels she may be ready for D/C from therapy.   Pertinent History memory loss, hearing loss and tinnitus, HTN, DM, OSA, migraine headaches, osteopenia, OA, chronic kidney disease, THR, fall with elbow fracture   Limitations Standing;Walking   Patient Stated Goals To figure out where the dizziness is coming from   Currently in Pain? No/denies            [facility] PT Assessment - 07/05/17 1124      Observation/Other Assessments   Focus on Therapeutic Outcomes (FOTO)  81 (19% limited)   Dizziness Handicap Inventory (DHI)  20 (mild)     Functional Gait  Assessment   Gait assessed  Yes   Gait Level Surface Walks 20 ft in less than 7 sec but greater than 5.5 sec, uses assistive device, slower speed, mild gait deviations, or deviates 6-10 in outside of the 12 in walkway width.   Change in Gait Speed Able to smoothly change walking speed without loss of balance or gait deviation. Deviate  no more than 6 in outside of the 12 in walkway width.   Gait with Horizontal Head Turns Performs head turns smoothly with no change in gait. Deviates no more than 6 in outside 12 in walkway width   Gait with Vertical Head Turns Performs head turns with no change in gait. Deviates no more than 6 in outside 12 in walkway width.   Gait and Pivot Turn Pivot turns safely within 3 sec and stops quickly with no loss of balance.   Step Over Obstacle Is able to step over 2 stacked shoe boxes taped together (9 in total height) without changing gait speed. No evidence of imbalance.   Gait with Narrow Base of Support  Ambulates 7-9 steps.   Gait with Eyes Closed Walks 20 ft, slow speed, abnormal gait pattern, evidence for imbalance, deviates 10-15 in outside 12 in walkway width. Requires more than 9 sec to ambulate 20 ft.   Ambulating Backwards Walks 20 ft, uses assistive device, slower speed, mild gait deviations, deviates 6-10 in outside 12 in walkway width.   Steps Alternating feet, no rail.   Total Score 25   FGA comment: 25/30                             PT Education - 07/05/17 1256    Education provided Yes   Education Details progress towards goals; D/C today   Person(s) Educated Patient   Methods Explanation   Comprehension Verbalized understanding          PT Short Term Goals - 06/14/17 1544      PT SHORT TERM GOAL #1   Title Pt will tolerate assessment of DVA, gait velocity and FGA with LTG to be revised   Time 4   Status Achieved     PT SHORT TERM GOAL #2   Title Pt will report dizziness as <2/10 during sit > stand and when looking up   Baseline Pt performed sit<>stand x 10 solid surface and compliant surface and standing with looking up.  No dizziness reported 06/14/17   Time 4   Period Weeks   Status Achieved     PT SHORT TERM GOAL #3   Title Pt will tolerate treatment for anterior canal cupulolithiasis   Baseline Performing habituation and has tolerated canolith repositioning.   Time 4   Period Weeks   Status Achieved     PT SHORT TERM GOAL #4   Title Pt will return demonstrate habituation, gaze adaptation and balance HEP with supervision   Baseline Met on 06/14/17   Time 4   Period Weeks   Status Achieved           PT Long Term Goals - 07/05/17 1134      PT LONG TERM GOAL #1   Title Pt will demonstrate independence with balance, habituation and gaze adaptation HEP   Time 8   Period Weeks   Status Achieved     PT LONG TERM GOAL #2   Title Pt will demonstrate improved VOR as indicated by DVA of 3-4 line difference   Baseline unable to  accurately assess due to blurry vision and significant neck guarding; if able to reassess later will re-instate goal   Status Deferred     PT LONG TERM GOAL #3   Title Pt will report 0/10 vertigo with positional testing, sit > stand and looking up   Baseline Met on 06/19/17 for positional testing; still  having some dizziness sit > stand from prolonged lying or sitting and looking up   Time 8   Period Weeks   Status Partially Met     PT LONG TERM GOAL #4   Title Pt will decrease falls risk during gait in community as indicated by gait velocity of >3.59f/sec   Baseline Scores 3.63 ft/sec on 06/19/17   Time 8   Period Weeks   Status Achieved     PT LONG TERM GOAL #5   Title Pt will improve balance during dynamic gait challenges as indicated by FGA score of > or = 22/30   Baseline 14/30; 25/30 on 9Sep 26, 2018  Time 8   Period Weeks   Status Achieved     PT LONG TERM GOAL #6   Title Pt will improve DHI score by 18 points to decrease severity of handicap   Baseline 52 moderate, 20 mild   Time 8   Period Weeks   Status Achieved               Plan - [id number]    Clinical Impression Statement Treatment session today focused on re-assessment of LTG, pt progress and if further visits needed.  Pt's BP and CBG log demonstrates improved BP and CBG in am and pm with only one day where CBG >200 with some dizziness.  Dizziness episodes have improved significantly overall this week.  Pt has met 4/6 LTG, one goal deferred due to neck pain/guarding and one goal partially met but likely due to ongoing medical issues causing intermittent episodes of spontaneous dizziness/imbalance.  Episodes are improving and pt's balance, safety and falls risk have significantly improved allowing pt to ambulate and transfer more safely in her home and community.  Pt safe for D/C at this time and advised to contact MD for new referral if positional dizziness returns or pt begins to experience new falls.   Pt  agreeable.   Rehab Potential Good   PT Frequency 2x / week   PT Duration 8 weeks   PT Treatment/Interventions ADLs/Self Care Home Management;Canalith Repostioning;Gait training;Functional mobility training;Therapeutic activities;Therapeutic exercise;Neuromuscular re-education;Balance training;Patient/family education;Vestibular   PT Next Visit Plan D/C today   Consulted and Agree with Plan of Care Patient      Patient will benefit from skilled therapeutic intervention in order to improve the following deficits and impairments:  Decreased balance, Dizziness, Difficulty walking  Visit Diagnosis: Unsteadiness on feet  Dizziness and giddiness       G-Codes - [id number]/[id number]/[id number]    Functional Assessment Tool Used (Outpatient Only) DHI, severity of vertigo, FGA 25/30   Functional Limitation Changing and maintaining body position   Changing and Maintaining Body Position Goal Status ((C5852 At least 1 percent but less than 20 percent impaired, limited or restricted   Changing and Maintaining Body Position Discharge Status ((D7824 At least 1 percent but less than 20 percent impaired, limited or restricted      Problem List Patient Active Problem List   Diagnosis Date Noted  . Sleep apnea 02/22/2017  . Memory loss 10/23/2014  . Essential hypertension 10/23/2014  . Hyperlipidemia 10/23/2014  . Type 2 diabetes mellitus with complication [place] [id number] . Diastasis recti 03/02/2013  . Constipation, chronic 03/02/2013   PHYSICAL THERAPY DISCHARGE SUMMARY  Visits from Start of Care: 10  Current functional level related to goals / functional outcomes: See goal achievement above   Remaining deficits: Intermittent episodes of spontaneous dizziness, impaired balance   Education / Equipment: HEP  Plan: Patient agrees to discharge.  Patient goals were met. Patient is being discharged due to meeting the stated rehab goals.  ?????     Raylene Everts, PT, DPT 07/05/17    1:08 PM    [facility] [address] [place], [place], [address] Phone: [phone]   Fax:  [phone]  Name: CLEON THOMA MRN: [id number] Date of Birth: 1936-09-03

## 2017-07-10 ENCOUNTER — Encounter: Payer: Medicare Other | Admitting: Physical Therapy

## 2017-07-12 ENCOUNTER — Encounter: Payer: Medicare Other | Admitting: Physical Therapy

## 2017-07-15 ENCOUNTER — Encounter: Payer: Medicare Other | Admitting: Physical Therapy

## 2017-07-15 ENCOUNTER — Ambulatory Visit
Admission: RE | Admit: 2017-07-15 | Discharge: 2017-07-15 | Disposition: A | Discharge status: Home / Self Care | Payer: Medicare Other | Source: Ambulatory Visit | Attending: Family Medicine | Admitting: Family Medicine

## 2017-07-15 DIAGNOSIS — Z1231 Encounter for screening mammogram for malignant neoplasm of breast: Secondary | ICD-10-CM

## 2017-07-16 NOTE — Progress Notes (Signed)
NEUROPSYCHOLOGICAL EVALUATION   Name:    Savannah Harris  Date of Birth:   15-Aug-1936 Date of Interview:  05/23/2017 Date of Testing:  05/23/2017   Date of Feedback:  07/18/2017       Background Information:  Reason for Referral:  Savannah Harris is a 81 y.o. female referred by Dr. Ellouise Newer to assess Savannah Harris current level of cognitive functioning and assist in differential diagnosis. Savannah current evaluation consisted of a review of available medical records, an interview with Savannah Harris, and Savannah completion of a neuropsychological testing battery. Informed consent was obtained.  History of Presenting Problem:  Savannah Harris reported gradual onset of short term memory decline several years ago. Savannah Harris and Savannah Harris husband are concerned about this because there is a family history of Alzheimer's dementia in Savannah Harris's mother. Savannah Harris mother was diagnosed in Savannah Harris late 21s and was in a nursing home for 9 years before passing away at age 62.   Savannah Harris has seen Dr. Delice Lesch in 2016, 07/2016 and most recently on 02/22/2017. Savannah Harris MoCA score in October 2017 was 29/30.  Upon direct questioning, Savannah Harris reported Savannah following cognitive complaints: forgetfulness for recent conversations/things Savannah Harris has been told, some repeating of stories/statements, misplacing and losing items (in May Savannah Harris lost several valuable things including a $125 gift card Savannah Harris was going to give to Savannah Harris daughter, and two of Savannah Harris rings), losing track of what Savannah Harris was saying in Savannah middle of a conversation, needing to re-read information in order to comprehend it, and word finding difficulty. Savannah Harris denies difficulty with getting lost when driving but Savannah Harris does have to think through routes now before Savannah Harris leaves. Savannah Harris husband is doing more of Savannah driving because he prefers to (he has reported that Savannah Harris forgets how to get to familiar places, per visit notes from Dr. Delice Lesch). Savannah Harris still manages Savannah Harris own finances, medications, and appointments and denies any  difficulty with this. Savannah Harris still cooks but Savannah Harris husband is helping Savannah Harris more.   CT of Savannah head completed on 10/25/2014 revealed no intracranial hemorrhage or CT evidence of large acute infarct. 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   Aside from memory decline, Savannah Harris complains of occasionally falling asleep without intending to when Savannah Harris is sitting down, even when in Savannah middle of a task. For example, 2-3 weeks ago, Savannah Harris put a pot of green beans on Savannah stove to heat and then sat down to make a to-do list. Some time later Savannah Harris woke up to Savannah fire alarm going off and Savannah fire department calling Savannah Harris home. Savannah Harris still had Savannah pen and paper in Savannah Harris hand.   Savannah Harris denied any sleep difficulty at night and feels Savannah Harris has been sleeping better than ever. Savannah Harris has not seen a sleep specialist as recommended because Savannah Harris does not have insomnia, but I explained that Savannah Harris daytime sleepiness or possible sleep attacks should be further evaluated by a sleep specialist.  Physically, Savannah Harris complains of some back and leg pain due to spinal stenosis. Additionally, Savannah Harris has dizziness which started last fall and is very bothersome to Savannah Harris. Savannah Harris had a fall in January 2018 due to dizziness (was looking up to take a wreath off Savannah Harris door and fell and broke a bone in Savannah Harris left arm). Savannah Harris has not had any falls since then.   Savannah Harris reports Savannah Harris  diabetes is well controlled. Savannah Harris saw a nutritionist recently. Savannah Harris reports good appetite. Savannah Harris tells me Savannah Harris is trying to lose 6-8 lbs.  Per Dr. Amparo Bristol notes, Savannah Harris had a concussion skiing several years ago, no loss of consciousness or skull fractures. Savannah Harris had seen a neurologist in [facility] 5-6 years ago for "mini-strokes" and recalls 2 episodes of left arm numbness. Savannah Harris has carotid artery aneurysms.   Savannah Harris denies any significant depression and states  that Savannah Harris mood is stable and generally positive. Savannah Harris does admit that Savannah Harris feels a lot of stress, mostly due to Savannah Harris husband.   Psychiatric history was denied. Savannah Harris has no history of mental health treatment.    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 Savannah Harris retired at age 24.  Marital history: Married almost 70 years. 2 children, 4 grandkids Alcohol: None Tobacco: Never SA: None   Medical History:  Past Medical History:  Diagnosis Date  . Arthritis    WRIST/ HANDS  . Asymptomatic vertebral artery stenosis    RIGHT SIDE  . Diverticulitis 11-30-13   during Christmas holiday- resolved  . GERD (gastroesophageal reflux disease)   . Hyperlipidemia   . Hypertension   . Leg cramps    OCCASIONAL  . Sleep apnea 11-30-13   mild-no cpap.  . Type 2 diabetes mellitus ([place])   . Urge urinary incontinence     Current medications:  Outpatient Encounter Prescriptions as of 07/18/2017  Medication Sig  . Ascorbic Acid (VITAMIN C) 1000 MG tablet Take 1,000 mg by mouth daily.  Marland Kitchen aspirin EC 81 MG tablet Take 81 mg by mouth at bedtime.  Marland Kitchen CANAGLIFLOZIN PO Take by mouth.  . Cholecalciferol (VITAMIN D3) 5000 UNITS CAPS Take 5,000 Units by mouth daily.   . cycloSPORINE (RESTASIS) 0.05 % ophthalmic emulsion Place 1 drop into both eyes 2 (two) times daily. May do addition dose  . Dulaglutide (TRULICITY) 9.62 EZ/6.6QH SOPN Inject into Savannah skin once a week.  Marland Kitchen glimepiride (AMARYL) 4 MG tablet Take 4 mg by mouth daily with breakfast.  . meloxicam (MOBIC) 15 MG tablet Take 15 mg by mouth daily.  . metFORMIN (GLUCOPHAGE-XR) 500 MG 24 hr tablet Take 500 mg by mouth 2 (two) times daily.   . Omega-3 Fatty Acids (FISH OIL) 1000 MG CAPS Take by mouth.  Marland Kitchen PRAVASTATIN SODIUM PO Take by mouth.  .  sitaGLIPtin (JANUVIA) 100 MG tablet Take 100 mg by mouth daily.   No facility-administered encounter medications on file as of 07/18/2017.      Current Examination:  Behavioral Observations:  Appearance: Neatly dressed and groomed Gait: Ambulated independently, no gross abnormalities observed Speech: Fluent; normal rate, rhythm and volume. Minimal word finding difficulty. Thought process: Linear, goal directed Affect: Full, bright Interpersonal: Pleasant, appropriate, engaged Orientation: Oriented to all spheres. Accurately named Savannah current President and his predecessor.   Tests Administered: . Test of Premorbid Functioning (TOPF) . Wechsler Adult Intelligence Scale-Fourth Edition (WAIS-IV): Similarities, Music therapist, Coding and Digit Span subtests . Wechsler Memory Scale-Fourth Edition (WMS-IV) Older Adult Version (ages 20-90): Logical Memory I, II and Recognition subtests  . [employer] Verbal Learning Test - 2nd Edition (CVLT-2) Short Form . Repeatable Battery for Savannah Assessment of Neuropsychological Status (RBANS) Form A:  Figure Copy and Recall subtests and Semantic Fluency subtest . Neuropsychological  Assessment Battery (NAB) Language Module, Form 1: Naming subtest . [place] Diagnostic Aphasia Examination: Complex Ideational Material subtest . Controlled Oral Word Association Test (COWAT) . Trail Making Test A and B . Clock drawing test . Geriatric Depression Scale (GDS) 15 Item . Generalized Anxiety Disorder - 7 item screener (GAD-7)  Test Results: Note: Standardized scores are presented only for use by appropriately trained professionals and to allow for any future test-retest comparison. These scores should not be interpreted without consideration of all Savannah information that is contained in Savannah rest of Savannah report. Savannah most recent standardization samples from Savannah test publisher or other sources were used whenever possible to derive standard scores; scores were corrected for  age, gender, ethnicity and education when available.   Test Scores:  Test Name Raw Score Standardized Score Descriptor  TOPF 44/70 SS= 102 Average  WAIS-IV Subtests     Similarities 19/36 ss= 8 Average  Block Design 30/66 ss= 11 Average  Coding 47/135 ss= 11 Average  Digit Span Forward 13/16 ss= 15 Superior  Digit Span Backward 7/16 ss= 9 Average  WMS-IV Subtests     LM I 31/53 ss= 10 Average  LM II 19/39 ss= 11 Average  LM II Recognition 18/23 Cum %: 26-50 WNL  RBANS Subtests     Figure Copy 17/20 Z= -0.4 Average  Figure Recall 10/20 Z= -0.6 Average  Semantic Fluency 12/40 Z= -1.5 Borderline  CVLT-II Scores     Trial 1 3/9 Z= -2.5 Impaired  Trial 4 6/9 Z= -1.5 Borderline  Trials 1-4 total 19/36 T= 35 Borderline  SD Free Recall 6/9 Z= -0.5 Average  LD Free Recall 0/9 Z= -2 Impaired  LD Cued Recall 4/9 Z= -1 Low average  Recognition Discriminability 7/9 hits, 2 false positives Z= -1 Low average  Forced Choice Recognition 9/9  WNL  NAB Language subtest     Naming 28/31 T= 44 Average  BDAE Subtest     Complex Ideational Material 11/12  WNL  COWAT-FAS 15 T= 34 Borderline  COWAT-Animals 14 T= 44 Average  Trail Making Test A  75" 0 errors T= 34 Borderline  Trail Making Test B  234" 2 errors T= 27 Impaired  Clock Drawing   WNL  GDS-15 2/15  WNL  GAD-7 8/21  Mild      Description of Test Results:  Premorbid verbal intellectual abilities were estimated to have been within Savannah average range based on a test of word reading. Psychomotor processing speed was average. Basic auditory attention was superior while more complex auditory attention (ie working memory) was average. Visual-spatial construction was average. Language abilities were generally intact. Specifically, confrontation naming was average, and semantic verbal fluency ranged from borderline (for fruits/vegetables) to average (for animals). Auditory comprehension of complex ideational material was within normal limits.  With regard to verbal memory, encoding and acquisition of non-contextual information (i.e., word list) was borderline impaired. After a brief distracter task, free recall was average (6/9 items recalled). After a delay, free recall was impaired (0/9 items recalled). Cued recall was low average (4/9 items recalled with semantic cueing). Performance on a yes/no recognition task was low average. On another verbal memory test, encoding and acquisition of contextual auditory information (i.e., short stories) was average. After a delay, free recall was average. Performance on a yes/no recognition task was within normal limits. With regard to non-verbal memory, delayed free recall of visual information was average. Executive functioning was variable. Mental flexibility and set-shifting were impaired on Trails B;  Savannah Harris required increased time to complete Savannah task and also committed two set loss errors. Verbal fluency with phonemic search restrictions was borderline impaired. Verbal abstract reasoning was average. Performance on a clock drawing task was intact. On a self-report measure of mood, Savannah Harris's responses were not indicative of clinically significant depression at Savannah present time. On a self-report measure of anxiety, Savannah Harris did endorse mild generalized anxiety at Savannah present time, characterized by excessive worrying nearly every day as well as nervousness, inability to control worrying, difficulty relaxing, irritability and fear of something awful happening.    Clinical Impressions: Mild cognitive impairment (with probable frontal-subcortical involvement); Mild anxiety. Results of cognitive testing reveal deficits in mental flexibility/set-shifting, phonemic verbal fluency, and memory encoding/retrieval of non-contextual information. Meanwhile, processing speed, attention, visual spatial skills, memory encoding/retrieval of contextual information and reasoning abilities were all intact. Savannah Harris denies any  significant change in Savannah Harris ability to manage complex ADLs. Taken together, Savannah Harris testing results and current level of functioning are most in line with a diagnosis of mild cognitive impairment. Savannah Harris cognitive profile is more reflective of mild frontal-subcortical dysfunction as opposed to cortical or hippocampal dysfunction. I do not see evidence of underlying Alzheimer's disease at this time. Savannah Harris current testing results raise suspicion for vascular cognitive impairment, and chronic microvascular ischemia have been noted on prior head CTs and brain MRIs. Savannah Harris is also reporting some generalized anxiety which could be exacerbating underlying MCI but I do not think anxiety is Savannah sole cause of Savannah Harris cognitive symptoms.     Recommendations/Plan: Based on Savannah findings of Savannah present evaluation, Savannah following recommendations are offered:  1. Follow up with sleep specialist. Reported sleep attacks during Savannah day are concerning for safety. Savannah Harris reports Savannah Harris met with Dr. Maxwell Caul but it unfortunately was not helpful. Further evaluation of Savannah sleep attacks and exploration of possible treatments seems indicated. Savannah Harris will speak with Dr. Delice Lesch about a referral to a different sleep specialist. 2. Optimal control of vascular risk factors was encouraged in order to reduce Savannah risk of cognitive decline. Additionally, cardiovascular exercise, mental stimulation and social interaction should be continued. 3. Savannah Harris does have some mild anxiety. I mentioned counseling could be an option if Savannah Harris is interested. 4. Strategies to improve executive functioning and memory encoding were reviewed and written information was given. 5. Re-evaluation in 1-2 years is recommended in order to monitor cognitive functioning, track any progression of symptoms and further assist with treatment planning.   Feedback to Harris: Savannah Harris and Savannah Harris for a feedback appointment on 07/18/2017 to review Savannah results of Savannah Harris  neuropsychological evaluation with this provider. 40 minutes face-to-face time was spent reviewing Savannah Harris test results, my impressions and my recommendations as detailed above.    Total time spent on this Harris's case: [id number] unit for interview with psychologist; [phone] units of testing by psychometrician under psychologist's supervision; [phone] units for medical record review, scoring of neuropsychological tests, interpretation of test results, preparation of this report, and review of results to Savannah Harris by psychologist.      Thank you for your referral of CHAMPAYNE KOCIAN. Please feel free to contact me if you have any questions or concerns regarding this report.

## 2017-07-17 ENCOUNTER — Other Ambulatory Visit: Payer: Self-pay | Admitting: Family Medicine

## 2017-07-17 ENCOUNTER — Encounter: Payer: Medicare Other | Admitting: Physical Therapy

## 2017-07-17 DIAGNOSIS — R928 Other abnormal and inconclusive findings on diagnostic imaging of breast: Secondary | ICD-10-CM

## 2017-07-18 ENCOUNTER — Ambulatory Visit (INDEPENDENT_AMBULATORY_CARE_PROVIDER_SITE_OTHER): Payer: Medicare Other | Admitting: Psychology

## 2017-07-18 ENCOUNTER — Encounter: Payer: Self-pay | Admitting: Psychology

## 2017-07-18 DIAGNOSIS — R413 Other amnesia: Secondary | ICD-10-CM

## 2017-07-18 DIAGNOSIS — G3184 Mild cognitive impairment, so stated: Secondary | ICD-10-CM

## 2017-07-18 NOTE — Patient Instructions (Signed)
Results of cognitive testing reveal weaknesses in mental flexibility/set-shifting, phonemic verbal fluency, and memory encoding/retrieval of non-contextual information. Meanwhile, processing speed, attention, visual spatial skills, memory encoding/retrieval of contextual information and reasoning abilities were all intact.   Taken together, testing results and current level of functioning are most in line with a diagnosis of mild cognitive impairment. This may represent vascular cognitive impairment. I do not see evidence of underlying Alzheimer's disease at this time.     1. Follow up with sleep specialist. Reported sleep attacks during the day are concerning for safety. 2. Counseling for anxiety may be helpful, or could speak with your doctor about medication options. 3. In order to maintain brain health, you should get regular cardiovascular exercise, mental stimulation and social interaction. You should optimally control all vascular risk factors (e.g., blood pressure, diabetes). 4. Strategies to enhance cognitive functioning in daily life are below. 5. Re-evaluation in 1-2 years is recommended.   Strategies to enhance cognitive functioning Attention, concentration, memory encoding and consolidation    . Make a plan and be prepared o If you find that you are more attentive at certain times of the day (i.e., the morning), plan important activities and discussions at that time o Determine which activities take the most time and which are most important, then prioritize your "to do list" based on this information o Break tasks into simpler parts, understand the steps involved before starting o Rehearse the steps mentally or write them down. If you write them down, you can use this as a checklist to check off as you complete them. o Visualize completing the task  . Use external aids  o Write everything down that you do not need to know or work with right now. Don't store extra information in  your brain that you don't need right now.  o Use a calendar or planner to make checklists, due dates and "to do" lists. o Use ONLY ONE calendar or planner and look at it frequently o Set alarms for important deadlines or appointments  . Minimize interruptions and distractions  o Find a good work environment, e.g., quiet room with a desk, close curtains, use earplugs, mask sounds with a fan or white noise machine o Turn off cell phone and/or email alerts during important tasks. In fact, it is helpful to schedule a block of time each day where you limit phone and email interruptions and focus on just the more detailed work you have. o Try to minimize the amount of background noise (i.e., television, music) when engaged in important tasks or conversations with others (note that some individuals find soft background music helpful in minimize distraction, so you may need to experiment with optimal level of noise for specific situations)  . Use active effort = consciously attending to details, closely analyzing o Failures of encoding may reflect failure to attend to one's own actions o Be prepared to work more slowly than you usually do  o When reading, allow time for re-reading sections  o Check your work for errors  . Avoid multitasking o Do not attempt to complete more than one task at one time. Focus on one task until it is completed and then move on to the next one. o Avoid other activities while engaged in important tasks, such as talking on the phone while balancing the checkbook, making a shopping list during a meeting.   . Use self-talk during tasks o Repeat the steps of the activity to yourself as you complete them  o Talk to yourself about your progress o This helps you maintain focus on the task and makes it easier to remember completing the task (Similar to "active effort" above)  . Conserve energy o Conserve energy to avoid fatigue and its effects on cognition - Get enough  sleep - Pace yourself  and make sure to take breaks - Be open to receiving help - Exercise for increased energy  . Conversational vigilance = paying attention during a conversation  o Listen actively: focus on the speaker and position yourself so that you can clearly hear the him/her, and have open/relaxed posture  o Eye contact: Maintaining eye contact with the person you are speaking with may increase the likelihood that important information is properly received  o Ask questions: Ask questions for clarification (e.g., request that the speaker explain something in a different way) or ask for information to be repeated if you become distracted, or if you do not hear or understand something during a conversation  o Paraphrase: Summarize or repeat back important information from a conversation in your own words to facilitate communication and ensure that you have heard correctly and understand

## 2017-07-19 ENCOUNTER — Ambulatory Visit
Admission: RE | Admit: 2017-07-19 | Discharge: 2017-07-19 | Disposition: A | Discharge status: Home / Self Care | Payer: Medicare Other | Source: Ambulatory Visit | Attending: Family Medicine | Admitting: Family Medicine

## 2017-07-19 DIAGNOSIS — R928 Other abnormal and inconclusive findings on diagnostic imaging of breast: Secondary | ICD-10-CM

## 2017-07-23 ENCOUNTER — Other Ambulatory Visit: Payer: Self-pay | Admitting: Family Medicine

## 2017-07-23 ENCOUNTER — Ambulatory Visit
Admission: RE | Admit: 2017-07-23 | Discharge: 2017-07-23 | Disposition: A | Discharge status: Home / Self Care | Payer: Medicare Other | Source: Ambulatory Visit | Attending: Family Medicine | Admitting: Family Medicine

## 2017-07-23 DIAGNOSIS — N63 Unspecified lump in unspecified breast: Secondary | ICD-10-CM

## 2017-07-24 ENCOUNTER — Encounter: Payer: Medicare Other | Admitting: Physical Therapy

## 2017-07-26 ENCOUNTER — Encounter: Payer: Medicare Other | Admitting: Physical Therapy

## 2017-07-31 ENCOUNTER — Encounter: Payer: Medicare Other | Admitting: Physical Therapy

## 2017-08-14 ENCOUNTER — Ambulatory Visit (HOSPITAL_COMMUNITY)
Admission: RE | Admit: 2017-08-14 | Discharge: 2017-08-14 | Disposition: A | Discharge status: Home / Self Care | Payer: Medicare Other | Source: Ambulatory Visit | Attending: Vascular Surgery | Admitting: Vascular Surgery

## 2017-08-14 ENCOUNTER — Other Ambulatory Visit (HOSPITAL_COMMUNITY): Payer: Self-pay | Admitting: Internal Medicine

## 2017-08-14 DIAGNOSIS — R0989 Other specified symptoms and signs involving the circulatory and respiratory systems: Secondary | ICD-10-CM

## 2017-08-14 DIAGNOSIS — I771 Stricture of artery: Secondary | ICD-10-CM | POA: Diagnosis not present

## 2017-08-14 LAB — VAS US CAROTID
LCCADDIAS: -20 cm/s
LCCADSYS: -71 cm/s
LCCAPDIAS: 22 cm/s
LEFT ECA DIAS: -6 cm/s
LEFT VERTEBRAL DIAS: 28 cm/s
LICADDIAS: -31 cm/s
LICADSYS: -113 cm/s
LICAPDIAS: -18 cm/s
LICAPSYS: -73 cm/s
Left CCA prox sys: 82 cm/s
RIGHT CCA MID DIAS: 20 cm/s
RIGHT ECA DIAS: -9 cm/s
RIGHT VERTEBRAL DIAS: 0 cm/s
Right CCA prox sys: 108 cm/s
Right cca dist sys: 82 cm/s

## 2017-08-27 ENCOUNTER — Ambulatory Visit: Payer: Medicare Other | Admitting: Neurology

## 2017-09-04 ENCOUNTER — Encounter: Payer: Self-pay | Admitting: Neurology

## 2017-09-04 ENCOUNTER — Ambulatory Visit: Payer: Medicare Other | Admitting: Neurology

## 2017-09-04 VITALS — BP 140/66 | HR 76 | Ht 61.0 in

## 2017-09-04 DIAGNOSIS — G4730 Sleep apnea, unspecified: Secondary | ICD-10-CM

## 2017-09-04 DIAGNOSIS — G3184 Mild cognitive impairment, so stated: Secondary | ICD-10-CM

## 2017-09-04 DIAGNOSIS — F411 Generalized anxiety disorder: Secondary | ICD-10-CM

## 2017-09-04 NOTE — Progress Notes (Signed)
NEUROLOGY FOLLOW UP OFFICE NOTE  Marcie BalShirley A Traynor [id number]  Sep 09, 1936  HISTORY OF PRESENT ILLNESS: I had the pleasure of seeing Elson ClanShirley Mccombie in follow-up in the neurology clinic on 09/04/2017.  The patient was last seen 6 months ago for memory loss. Her husband was reporting worsening memory, getting confused with doctor appointments. 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 There was no evidence of underlying Alzheimer's disease at this time. There was also note of anxiety, which may be contributing to cognitive impairment. She was reporting daytime drowsiness to Dr. Alinda DoomsBailar, and recommendation for seeing a sleep specialist was done. She saw Dr. Earl Galasborne but was unhappy with her visit and requests to see a different sleep specialist. She continues to drive without getting lost, denies any missed medications. She had some changes in her diabetes medications and sugars have been higher at 170s, she plans to see her PCP for further adjustment. She reports a lot of stress with her husband, "this man living with me is giving me fits." She continues to have the dizziness. She denies any headaches, focal numbness/tingling/weakness, no falls.   HPI: This is a pleasant 81 yo RH woman with a history of hypertension, hyperlipidemia, diabetes, OSA, right MCA stenosis, osteoarthritis, and migraines, who presented fro evaluation of memory loss that she and her husband started noticing for several years, however worsened in 2015. She reports she cannot find the words she wants to say. She cannot recall small things from the day before, or what she went to get in another room. She got lost twice while driving, missing her exit in the past month. She occasionally forgets to take her afternoon medication. She denies any missed bill payments. She reports being good with multitasking. Her husband reports that he  would have to repeat several things to her, they are constantly looking for something. She would buy something and cannot find where she put it. She would need instructions on how to get to places she has been going to for years. She has difficulties grasping what is being said, with difficulty processing. She has left the stove on to do something else. Today, she could not turn the computer on because she could not recall that she had to put a password in. She has gotten more short with her husband, no paranoia.   She has an implant for incontinence. Her mother was diagnosed with Alzheimer's disease in her late 2060s. She had a concussion skiing a few years ago, no loss of consciousness or skull fractures. She had seen a neurologist in [facility] 5[facility]6 years ago for "mini-strokes" and recalls 2 episodes of left arm numbness. She has carotid artery aneurysms.   Diagnostic Data: Records and images were personally reviewed where available.  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 TSH and B12 normal.   PAST MEDICAL HISTORY: Past Medical History:  Diagnosis Date  . Arthritis    WRIST/ HANDS  . Asymptomatic vertebral artery stenosis    RIGHT SIDE  . Diverticulitis 11-30-13   during Christmas holiday- resolved  . GERD (gastroesophageal reflux disease)   . Hyperlipidemia   . Hypertension   . Leg cramps    OCCASIONAL  . Sleep apnea 11-30-13   mild-no cpap.  . Type 2 diabetes mellitus (HCC)   .  Urge urinary incontinence     MEDICATIONS: Current Outpatient Medications on File Prior to Visit  Medication Sig Dispense Refill  . Ascorbic Acid (VITAMIN C) 1000 MG tablet Take 1,000 mg by mouth daily.    Marland Kitchen aspirin EC 81 MG tablet Take 81 mg by mouth at bedtime.    Marland Kitchen CANAGLIFLOZIN PO Take by mouth.    . Cholecalciferol (VITAMIN D3) 5000 UNITS  CAPS Take 5,000 Units by mouth daily.     . cycloSPORINE (RESTASIS) 0.05 % ophthalmic emulsion Place 1 drop into both eyes 2 (two) times daily. May do addition dose    . Dulaglutide (TRULICITY) 0.75 MG/0.5ML SOPN Inject into the skin once a week.    Marland Kitchen glimepiride (AMARYL) 4 MG tablet Take 4 mg by mouth daily with breakfast.    . meloxicam (MOBIC) 15 MG tablet Take 15 mg by mouth daily.    . metFORMIN (GLUCOPHAGE-XR) 500 MG 24 hr tablet Take 500 mg by mouth 2 (two) times daily.     . Omega-3 Fatty Acids (FISH OIL) 1000 MG CAPS Take by mouth.    Marland Kitchen PRAVASTATIN SODIUM PO Take by mouth.    . sitaGLIPtin (JANUVIA) 100 MG tablet Take 100 mg by mouth daily.     No current facility-administered medications on file prior to visit.     ALLERGIES: Allergies  Allergen Reactions  . Latex Other (See Comments)    BLISTERS  . Canagliflozin     Pt states Invokana causes dizziness  . Adhesive [Tape] Other (See Comments)    BLISTERS  . Codeine Nausea And Vomiting    FAMILY HISTORY: Family History  Problem Relation Age of Onset  . Diabetes Mother   . Alzheimer's disease Mother   . Dementia Mother     SOCIAL HISTORY: Social History   Socioeconomic History  . Marital status: Married    Spouse name: Not on file  . Number of children: Not on file  . Years of education: Not on file  . Highest education level: Not on file  Social Needs  . Financial resource strain: Not on file  . Food insecurity - worry: Not on file  . Food insecurity - inability: Not on file  . Transportation needs - medical: Not on file  . Transportation needs - non-medical: Not on file  Occupational History  . Not on file  Tobacco Use  . Smoking status: Never Smoker  . Smokeless tobacco: Never Used  Substance and Sexual Activity  . Alcohol use: No    Alcohol/week: 0.0 oz  . Drug use: No  . Sexual activity: Not Currently  Other Topics Concern  . Not on file  Social History Narrative  . Not on file    REVIEW OF  SYSTEMS: Constitutional: No fevers, chills, or sweats, + generalized fatigue, no change in appetite Eyes: No visual changes, double vision, eye pain Ear, nose and throat: No hearing loss, ear pain, nasal congestion, sore throat Cardiovascular: No chest pain, palpitations Respiratory:  No shortness of breath at rest or with exertion, wheezes GastrointestinaI: No nausea, vomiting, diarrhea, abdominal pain, fecal incontinence Genitourinary:  No dysuria, urinary retention or frequency Musculoskeletal:  + neck pain, back pain Integumentary: No rash, pruritus, skin lesions Neurological: as above Psychiatric: No depression,+ insomnia, anxiety Endocrine: No palpitations, fatigue, diaphoresis, mood swings, change in appetite, change in weight, increased thirst Hematologic/Lymphatic:  No anemia, purpura, petechiae. Allergic/Immunologic: no itchy/runny eyes, nasal congestion, recent allergic reactions, rashes  PHYSICAL EXAM: Vitals:   09/04/17 1156  BP: 140/66  Pulse: 76  SpO2: 97%   General: No acute distress Head:  Normocephalic/atraumatic Neck: supple, no paraspinal tenderness, full range of motion Heart:  Regular rate and rhythm Lungs:  Clear to auscultation bilaterally Back: No paraspinal tenderness Skin/Extremities: No rash, no edema Neurological Exam: alert and oriented to person, place, and time. No aphasia or dysarthria. Fund of knowledge is appropriate.  Recent and remote memory are intact. Attention and concentration are normal.    Able to name objects and repeat phrases. Cranial nerves: Pupils equal, round. No facial asymmetry. Tongue, uvula, palate midline.  Motor: moves all extremities symmetrically. Gait narrow-based and steady.  IMPRESSION: This is a pleasant 81 yo RH woman with hypertension, hyperlipidemia, diabetes, migraines, with mild cognitive impairment. 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 Her Neuropsychological evaluation indicated mild cognitive impairment that may represent vascular cognitive impairment. There was no evidence of underlying Alzheimer's disease at this time. There was also note of anxiety, which may be contributing to cognitive impairment. We discussed the diagnosis, prognosis, and the importance of control of vascular risk factors. No indication to start cholinesterase inhibitors at this time. We discussed the fatigue and daytime drowsiness, she was previously told she may have sleep apnea and was supposed to have a sleep study, but does not want to return to her prior doctor. Referral to Sleep Medicine will be done. We also discussed anxiety noted on Neuropsych testing, she reports a lot of stress at home, and will reconsider seeing a therapist on her next visit. She will follow-up in 6 months and knows to call for any changes.   Thank you for allowing me to participate in her care.  Please do not hesitate to call for any questions or concerns.  The duration of this appointment visit was 25 minutes of face-to-face time with the patient.  Greater than 50% of this time was spent in counseling, explanation of diagnosis, planning of further management, and coordination of care.   Patrcia DollyKaren Jermisha Hoffart, M.D.   CC: Dr. Clelia CroftShaw

## 2017-09-04 NOTE — Patient Instructions (Signed)
1. Refer to Sleep specialist for sleep apnea 2. Continue control of blood pressure, cholesterol, diabetes, exercise 3. Follow-up in 6 months, call for any changes  RECOMMENDATIONS FOR ALL PATIENTS WITH MEMORY PROBLEMS: 1. Continue to exercise (Recommend 30 minutes of walking everyday, or 3 hours every week) 2. Increase social interactions - continue going to [place] and enjoy social gatherings with friends and family 3. Eat healthy, avoid fried foods and eat more fruits and vegetables 4. Maintain adequate blood pressure, blood sugar, and blood cholesterol level. Reducing the risk of stroke and cardiovascular disease also helps promoting better memory. 5. Avoid stressful situations. Live a simple life and avoid aggravations. Organize your time and prepare for the next day in anticipation. 6. Sleep well, avoid any interruptions of sleep and avoid any distractions in the bedroom that may interfere with adequate sleep quality 7. Avoid sugar, avoid sweets as there is a strong link between excessive sugar intake, diabetes, and cognitive impairment The Mediterranean diet has been shown to help patients reduce the risk of progressive memory disorders and reduces cardiovascular risk. This includes eating fish, eat fruits and green leafy vegetables, nuts like almonds and hazelnuts, walnuts, and also use olive oil. Avoid fast foods and fried foods as much as possible. Avoid sweets and sugar as sugar use has been linked to worsening of memory function.  There is always a concern of gradual progression of memory problems. If this is the case, then we may need to adjust level of care according to patient needs. Support, both to the patient and caregiver, should then be put into place.

## 2017-11-18 ENCOUNTER — Institutional Professional Consult (permissible substitution): Payer: Medicare Other | Admitting: Pulmonary Disease

## 2018-01-22 ENCOUNTER — Ambulatory Visit
Admission: RE | Admit: 2018-01-22 | Discharge: 2018-01-22 | Disposition: A | Discharge status: Home / Self Care | Payer: Medicare Other | Source: Ambulatory Visit | Attending: Family Medicine | Admitting: Family Medicine

## 2018-01-22 ENCOUNTER — Other Ambulatory Visit: Payer: Self-pay | Admitting: Family Medicine

## 2018-01-22 DIAGNOSIS — N63 Unspecified lump in unspecified breast: Secondary | ICD-10-CM

## 2018-02-10 ENCOUNTER — Encounter: Payer: Self-pay | Admitting: Neurology

## 2018-02-10 ENCOUNTER — Ambulatory Visit: Payer: Medicare Other | Admitting: Neurology

## 2018-02-10 VITALS — Ht 61.0 in | Wt 138.0 lb

## 2018-02-10 DIAGNOSIS — R413 Other amnesia: Secondary | ICD-10-CM | POA: Diagnosis not present

## 2018-02-10 DIAGNOSIS — R51 Headache: Secondary | ICD-10-CM | POA: Diagnosis not present

## 2018-02-10 DIAGNOSIS — R351 Nocturia: Secondary | ICD-10-CM | POA: Diagnosis not present

## 2018-02-10 DIAGNOSIS — G4733 Obstructive sleep apnea (adult) (pediatric): Secondary | ICD-10-CM | POA: Diagnosis not present

## 2018-02-10 DIAGNOSIS — R519 Headache, unspecified: Secondary | ICD-10-CM

## 2018-02-10 DIAGNOSIS — R400 Somnolence: Secondary | ICD-10-CM

## 2018-02-10 NOTE — Progress Notes (Signed)
Subjective:    Patient ID: Savannah Harris is a 82 y.o. female.  HPI     Savannah Foley, MD, PhD [facility] [address] [address] [place], [place] [address]  Dear Dr. Clelia Croft,   I saw your patient, Savannah Harris, upon your kind request, in my neurologic clinic today for initial consultation of her sleep disorder, in particular, concern for underlying obstructive sleep apnea. The patient is accompanied by her husband today. As you know, [name] is a 82 year old right-handed woman with an underlying medical history of diabetes, memory loss, hypertension, hyperlipidemia, reflux disease, diverticulitis, arthritis, and overweight state, who reports snoring and excessive daytime somnolence. She had a sleep study several years ago, results are not available for my review today. She previously saw Dr. Earl Gala for sleep. She was diagnosed with obstructive sleep apnea as I understand. She does not have a CPAP machine. I reviewed your office note from 01/28/2018. Her Epworth sleepiness score is 19 out of 24 today, fatigue score is 45 out of 63. She is married and lives with her husband, she is retired, they have 2 children. She is a nonsmoker and does not utilize alcohol, does not drink caffeine daily, occasional soda.   Her bedtime is typically around 10 or 10:30 PM, rise time around 6:30 AM. She has had rare morning headaches, used to have more morning headaches when she still suffered from migraine headaches. She has nocturia about 2-3 times per average night, is not aware of any family history of OSA, had a tonsillectomy as child. She has difficulty falling asleep and staying asleep. She has tried over-the-counter medication including simple Tylenol, Tylenol arthritis, Tylenol PM and melatonin, the melatonin helped temporarily. She has fallen asleep at a stop light. She is aware that she is sleepy enough to have concerns for driving. She was told in the past that she may  have mild sleep apnea but it may not require treatment. She did not pursue CPAP therapy for that reason. She denies telltale symptoms of restless leg syndrome but has had leg cramps.  Her Past Medical History Is Significant For: Past Medical History:  Diagnosis Date  . Arthritis    WRIST/ HANDS  . Asymptomatic vertebral artery stenosis    RIGHT SIDE  . Diverticulitis 11-30-13   during Christmas holiday- resolved  . GERD (gastroesophageal reflux disease)   . Hyperlipidemia   . Hypertension   . Leg cramps    OCCASIONAL  . Sleep apnea 11-30-13   mild-no cpap.  . Type 2 diabetes mellitus (HCC)   . Urge urinary incontinence     Her Past Surgical History Is Significant For: Past Surgical History:  Procedure Laterality Date  . CATARACT EXTRACTION W/ INTRAOCULAR LENS  IMPLANT, BILATERAL    . COLONOSCOPY WITH PROPOFOL N/A 12/15/2013   Procedure: COLONOSCOPY WITH PROPOFOL;  Surgeon: Charolett Bumpers, MD;  Location: [facility];  Service: Endoscopy;  Laterality: N/A;  . INTERSTIM IMPLANT PLACEMENT N/A 07/28/2013   Procedure: Leane Platt IMPLANT FIRST STAGE;  Surgeon: Martina Sinner, MD;  Location: [facility];  Service: Urology;  Laterality: N/A;  . INTERSTIM IMPLANT PLACEMENT N/A 07/28/2013   Procedure: Leane Platt IMPLANT SECOND STAGE;  Surgeon: Martina Sinner, MD;  Location: [facility];  Service: Urology;  Laterality: N/A;  . LEFT WRSIT TENDON REPAIR  1999  . PUBOVAGINAL SLING  08-26-2007   SPARC SLING  . TONSILLECTOMY AND ADENOIDECTOMY  AS CHILD  . TOTAL ABDOMINAL  HYSTERECTOMY W/ BILATERAL SALPINGOOPHORECTOMY  1970s   AND APPENDECTOMY  . TOTAL HIP ARTHROPLASTY Right 12-16-2008    Her Family History Is Significant For: Family History  Problem Relation Age of Onset  . Diabetes Mother   . Alzheimer's disease Mother   . Dementia Mother     Her Social History Is Significant For: Social History   Socioeconomic History  . Marital status: Married     Spouse name: Not on file  . Number of children: Not on file  . Years of education: Not on file  . Highest education level: Not on file  Occupational History  . Not on file  Social Needs  . Financial resource strain: Not on file  . Food insecurity:    Worry: Not on file    Inability: Not on file  . Transportation needs:    Medical: Not on file    Non-medical: Not on file  Tobacco Use  . Smoking status: Never Smoker  . Smokeless tobacco: Never Used  Substance and Sexual Activity  . Alcohol use: No    Alcohol/week: 0.0 oz  . Drug use: No  . Sexual activity: Not Currently  Lifestyle  . Physical activity:    Days per week: Not on file    Minutes per session: Not on file  . Stress: Not on file  Relationships  . Social connections:    Talks on phone: Not on file    Gets together: Not on file    Attends religious service: Not on file    Active member of club or organization: Not on file    Attends meetings of clubs or organizations: Not on file    Relationship status: Not on file  Other Topics Concern  . Not on file  Social History Narrative  . Not on file    Her Allergies Are:  Allergies  Allergen Reactions  . Latex Other (See Comments)    BLISTERS  . Canagliflozin     Pt states Invokana causes dizziness  . Adhesive [Tape] Other (See Comments)    BLISTERS  . Codeine Nausea And Vomiting  :   Her Current Medications Are:  Outpatient Encounter Medications as of 02/10/2018  Medication Sig  . Ascorbic Acid (VITAMIN C) 1000 MG tablet Take 1,000 mg by mouth daily.  Marland Kitchen aspirin EC 81 MG tablet Take 81 mg by mouth at bedtime.  Marland Kitchen CANAGLIFLOZIN PO Take by mouth.  . Cholecalciferol (VITAMIN D3) 5000 UNITS CAPS Take 5,000 Units by mouth daily.   . cycloSPORINE (RESTASIS) 0.05 % ophthalmic emulsion Place 1 drop into both eyes 2 (two) times daily. May do addition dose  . Dulaglutide (TRULICITY) 0.75 MG/0.5ML SOPN Inject into the skin once a week.  Marland Kitchen glimepiride (AMARYL) 4 MG  tablet Take 4 mg by mouth daily with breakfast.  . KRILL OIL PO Take by mouth.  . metFORMIN (GLUMETZA) 1000 MG (MOD) 24 hr tablet Take 2 tablets by mouth daily.  Marland Kitchen PRAVASTATIN SODIUM PO Take by mouth.  . [DISCONTINUED] meloxicam (MOBIC) 15 MG tablet Take 15 mg by mouth daily.  . [DISCONTINUED] metFORMIN (GLUCOPHAGE-XR) 500 MG 24 hr tablet Take 500 mg by mouth 2 (two) times daily.   . [DISCONTINUED] Omega-3 Fatty Acids (FISH OIL) 1000 MG CAPS Take by mouth.  . [DISCONTINUED] sitaGLIPtin (JANUVIA) 100 MG tablet Take 100 mg by mouth daily.   No facility-administered encounter medications on file as of 02/10/2018.   :  Review of Systems:  Out of a complete  14 point review of systems, all are reviewed and negative with the exception of these symptoms as listed below:  Review of Systems  Neurological:       Epworth Sleepiness Scale 0= would never doze 1= slight chance of dozing 2= moderate chance of dozing 3= high chance of dozing  Sitting and reading:3 Watching TV:3 Sitting inactive in a public place (ex. Theater or meeting):2 As a passenger in a car for an hour without a break:2 Lying down to rest in the afternoon:3 Sitting and talking to someone:2 Sitting quietly after lunch (no alcohol):3 In a car, while stopped in traffic:1 Total:19     Objective:  Neurological Exam  Physical Exam Physical Examination:   There were no vitals filed for this visit.  General Examination: The patient is a very pleasant 82 y.o. female in no acute distress. She appears well-developed and well-nourished and well groomed.   HEENT: Normocephalic, atraumatic, pupils are equal, round and reactive to light and accommodation. Extraocular tracking is good without limitation to gaze excursion or nystagmus noted. Normal smooth pursuit is noted. Hearing is grossly intact. Face is symmetric with normal facial animation and normal facial sensation. Speech is clear with no dysarthria noted. There is no  hypophonia. There is no lip, neck/head, jaw or voice tremor. Neck is supple with full range of passive and active motion. There are no carotid bruits on auscultation. Oropharynx exam reveals: moderate mouth dryness, adequate dental hygiene and mild airway crowding, due to smaller airway entry and redundant soft palate. Mallampati is class II. Tongue protrudes centrally and palate elevates symmetrically. Tonsils are absent. Neck size is 14.75 inches.   Chest: Clear to auscultation without wheezing, rhonchi or crackles noted.  Heart: S1+S2+0, regular and normal without murmurs, rubs or gallops noted.   Abdomen: Soft, non-tender and non-distended with normal bowel sounds appreciated on auscultation.  Extremities: There is trace pitting edema in the distal lower extremities bilaterally. Pedal pulses are intact.  Skin: Warm and dry without trophic changes noted.  Musculoskeletal: exam reveals no obvious joint deformities, tenderness or joint swelling or erythema.   Neurologically:  Mental status: The patient is awake, alert and oriented in all 4 spheres. Her immediate and remote memory, attention, language skills and fund of knowledge are appropriate. There is no evidence of aphasia, agnosia, apraxia or anomia. Speech is clear with normal prosody and enunciation. Thought process is linear. Mood is normal and affect is normal.  Cranial nerves II - XII are as described above under HEENT exam. In addition: shoulder shrug is normal with equal shoulder height noted. Motor exam: Normal bulk, strength and tone is noted. There is no tremor. Romberg is negative. Fine motor skills and coordination: grossly intact.  Cerebellar testing: No dysmetria or intention tremor. There is no truncal or gait ataxia.  Sensory exam: intact to LT.   Gait, station and balance: She stands easily. No veering to one side is noted. No leaning to one side is noted. Posture is age-appropriate and stance is narrow based. Gait shows  normal stride length and pace. No problems turning are noted. She has difficulty with tandem walk, which is not unusual for age.                Assessment and Plan:   In summary, DAVONNE BABY is a very pleasant 35 y.o.-year old female with an underlying medical history of diabetes, memory loss, hypertension, hyperlipidemia, reflux disease, diverticulitis, arthritis, and overweight state, whose history and physical exam and  prior sleep testing are concerning for obstructive sleep apnea (OSA). I had a long chat with the patient and her husband about my findings and the diagnosis of OSA, its prognosis and treatment options. We talked about medical treatments, surgical interventions and non-pharmacological approaches. I explained in particular the risks and ramifications of untreated moderate to severe OSA, especially with respect to developing cardiovascular disease down the Road, including congestive heart failure, difficult to treat hypertension, cardiac arrhythmias, or stroke. Even type 2 diabetes has, in part, been linked to untreated OSA. Symptoms of untreated OSA include daytime sleepiness, memory problems, mood irritability and mood disorder such as depression and anxiety, lack of energy, as well as recurrent headaches, especially morning headaches. We talked about trying to maintain a healthy lifestyle in general, as well as the importance of weight control. I encouraged the patient to eat healthy, exercise daily and keep well hydrated, to keep a scheduled bedtime and wake time routine, to not skip any meals and eat healthy snacks in between meals. I advised the patient not to drive when feeling sleepy. I recommended the following at this time: sleep study with potential positive airway pressure titration. (We will score hypopneas at 4%).   I explained the sleep test procedure to the patient and also outlined possible surgical and non-surgical treatment options of OSA, including the use of a  custom-made dental device (which would require a referral to a specialist dentist or oral surgeon), upper airway surgical options, such as pillar implants, radiofrequency surgery, tongue base surgery, and UPPP (which would involve a referral to an ENT surgeon). Rarely, jaw surgery such as mandibular advancement may be considered.  I also explained the CPAP treatment option to the patient, who indicated that she would be willing to try CPAP if the need arises. I explained the importance of being compliant with PAP treatment, not only for insurance purposes but primarily to improve Her symptoms, and for the patient's long term health benefit, including to reduce Her cardiovascular risks. I answered all their questions today and the patient and her husband were in agreement. I would like to see her back after the sleep study is completed and encouraged her to call with any interim questions, concerns, problems or updates.   Thank you very much for allowing me to participate in the care of this nice patient. If I can be of any further assistance to you please do not hesitate to call me at [phone].  Sincerely,   Savannah Foley, MD, PhD

## 2018-02-10 NOTE — Patient Instructions (Addendum)

## 2018-03-07 ENCOUNTER — Ambulatory Visit: Payer: Medicare Other | Admitting: Neurology

## 2018-03-10 ENCOUNTER — Ambulatory Visit (INDEPENDENT_AMBULATORY_CARE_PROVIDER_SITE_OTHER): Payer: Medicare Other | Admitting: Neurology

## 2018-03-10 DIAGNOSIS — R519 Headache, unspecified: Secondary | ICD-10-CM

## 2018-03-10 DIAGNOSIS — R400 Somnolence: Secondary | ICD-10-CM

## 2018-03-10 DIAGNOSIS — G4720 Circadian rhythm sleep disorder, unspecified type: Secondary | ICD-10-CM

## 2018-03-10 DIAGNOSIS — R351 Nocturia: Secondary | ICD-10-CM

## 2018-03-10 DIAGNOSIS — R51 Headache: Secondary | ICD-10-CM

## 2018-03-10 DIAGNOSIS — G4733 Obstructive sleep apnea (adult) (pediatric): Secondary | ICD-10-CM | POA: Diagnosis not present

## 2018-03-10 DIAGNOSIS — R413 Other amnesia: Secondary | ICD-10-CM

## 2018-03-13 ENCOUNTER — Telehealth: Payer: Self-pay | Admitting: Neurology

## 2018-03-13 NOTE — Addendum Note (Signed)
Addended by: Huston Foley on: 03/13/2018 08:16 AM   Modules accepted: Orders

## 2018-03-13 NOTE — Telephone Encounter (Signed)
Called patient to discuss sleep study results. No answer at this time. LVM for the patient to call back.   

## 2018-03-13 NOTE — Telephone Encounter (Signed)
-----   Message from Huston Foley, MD sent at 03/13/2018  8:16 AM EDT ----- Patient referred by Dr. Clelia Croft, seen by me on 02/10/18, diagnostic PSG on 03/10/18.   Please call and notify the patient that the recent sleep study showed moderate to severe obstructive sleep apnea, with a total AHI of 17.5/hour, REM AHI of 30/hour, supine AHI of 32.2/hour and O2 nadir of 84%. I recommend treatment for this in the form of CPAP. This will require a repeat sleep study for proper titration and mask fitting and correct monitoring of the oxygen saturations. Please explain to patient. I have placed an order in the chart. Thanks.  Huston Foley, MD, PhD [facility])

## 2018-03-13 NOTE — Progress Notes (Signed)
Patient referred by Dr. Clelia Croft, seen by me on 02/10/18, diagnostic PSG on 03/10/18.   Please call and notify the patient that the recent sleep study showed moderate to severe obstructive sleep apnea, with a total AHI of 17.5/hour, REM AHI of 30/hour, supine AHI of 32.2/hour and O2 nadir of 84%. I recommend treatment for this in the form of CPAP. This will require a repeat sleep study for proper titration and mask fitting and correct monitoring of the oxygen saturations. Please explain to patient. I have placed an order in the chart. Thanks.  Huston Foley, MD, PhD [facility])

## 2018-03-13 NOTE — Procedures (Signed)
PATIENT'S NAME:  Savannah Harris, Savannah Harris DOB:      10/13/1936      MR#:    [id number]     DATE OF RECORDING: 03/10/2018 REFERRING M.D.:  Martha Clan, MD Study Performed:   Baseline Polysomnogram HISTORY: 82 year old woman with a history of diabetes, memory loss, hypertension, hyperlipidemia, reflux disease, diverticulitis, arthritis, and overweight state, who reports snoring and excessive daytime somnolence. Her Epworth sleepiness score is 19 out of 24. The patient's weight 139 pounds with a height of 61 (inches), resulting in a BMI of 26.2 kg/m2. The patient's neck circumference measured 14.5 inches.  CURRENT MEDICATIONS: Ascorbic Acid, Aspirin, Canagliflozin, Vitamin D3, Cyclosporine, Dulaglutide, Glimepiride, Krill Oil, Metformin and Pravastatin.   PROCEDURE:  This is a multichannel digital polysomnogram utilizing the Somnostar 11.2 system.  Electrodes and sensors were applied and monitored per AASM Specifications.   EEG, EOG, Chin and Limb EMG, were sampled at 200 Hz.  ECG, Snore and Nasal Pressure, Thermal Airflow, Respiratory Effort, CPAP Flow and Pressure, Oximetry was sampled at 50 Hz. Digital video and audio were recorded.      BASELINE STUDY  Lights Out was at 21:43 and Lights On at 05:18.  Total recording time (TRT) was 455 minutes, with a total sleep time (TST) of 381 minutes.   The patient's sleep latency was 23.5 minutes.  REM latency was 67.5 minutes.  The sleep efficiency was 83.7 %.     SLEEP ARCHITECTURE: WASO (Wake after sleep onset) was 49.5 minutes with mild sleep fragmentation noted. There were 10 minutes in Stage N1, 262 minutes Stage N2, 4.5 minutes Stage N3 and 104.5 minutes in Stage REM.  The percentage of Stage N1 was 2.6%, Stage N2 was 68.8%, which is increased, Stage N3 was 1.2% and Stage R (REM sleep) was 27.4%, which is mildly increased. The arousals were noted as: 26 were spontaneous, 1 were associated with PLMs, 109 were associated with respiratory events.  RESPIRATORY  ANALYSIS:  There were a total of 111 respiratory events:  4 obstructive apneas, 0 central apneas and 0 mixed apneas with a total of 4 apneas and an apnea index (AI) of .6 /hour. There were 107 hypopneas with a hypopnea index of 16.9 /hour. The patient also had 0 respiratory event related arousals (RERAs).      The total APNEA/HYPOPNEA INDEX (AHI) was 17.5/hour and the total RESPIRATORY DISTURBANCE INDEX was 0. 17.5 /hour.  15 events occurred in REM sleep and 184 events in NREM. The REM AHI was 30/hour, versus a non-REM AHI of 20.8. The patient spent 184.5 minutes of total sleep time in the supine position and 197 minutes in non-supine. The supine AHI was 32.2 versus a non-supine AHI of 3.7.  OXYGEN SATURATION & C02:  The Wake baseline 02 saturation was 94%, with the lowest being 84%. Time spent below 89% saturation equaled 28 minutes.   PERIODIC LIMB MOVEMENTS:   The patient had a total of 8 Periodic Limb Movements. The Periodic Limb Movement (PLM) index was 1.3 and the PLM Arousal index was .2/hour.  Audio and video analysis did not show any abnormal or unusual movements, behaviors, phonations or vocalizations. The patient took 1 bathroom break. Minimal snoring was noted. The EKG was in keeping with normal sinus rhythm (NSR).   Post-study, the patient indicated that sleep was worse than usual.   IMPRESSION:  1. Obstructive Sleep Apnea (OSA) 2. Dysfunctions associated with sleep stages or arousal from sleep  RECOMMENDATIONS:  1. This study demonstrates moderate to severe  obstructive sleep apnea, with a total AHI of 17.5/hour, REM AHI of 30/hour, supine AHI of 32.2/hour and O2 nadir of 84%. Treatment with positive airway pressure in the form of CPAP is recommended. This will require a full night titration study to optimize therapy. Other treatment options may include avoidance of supine sleep position along with weight loss, upper airway or jaw surgery in selected patients or the use of an oral  appliance in certain patients. ENT evaluation and/or consultation with a maxillofacial surgeon or dentist may be feasible in some instances.    2. Please note that untreated obstructive sleep apnea may carry additional perioperative morbidity. Patients with significant obstructive sleep apnea should receive perioperative PAP therapy and the surgeons and particularly the anesthesiologist should be informed of the diagnosis and the severity of the sleep disordered breathing. 3. This study shows sleep fragmentation and abnormal sleep stage percentages; these are nonspecific findings and per se do not signify an intrinsic sleep disorder or a cause for the patient's sleep-related symptoms. Causes include (but are not limited to) the first night effect of the sleep study, circadian rhythm disturbances, medication effect or an underlying mood disorder or medical problem.  4. The patient should be cautioned not to drive, work at heights, or operate dangerous or heavy equipment when tired or sleepy. Review and reiteration of good sleep hygiene measures should be pursued with any patient. 5. The patient will be seen in follow-up by Dr. Frances Furbish at [facility] for discussion of the test results and further management strategies. The referring provider will be notified of the test results.  I certify that I have reviewed the entire raw data recording prior to the issuance of this report in accordance with the Standards of Accreditation of the American Academy of Sleep Medicine (AASM)  Huston Foley, MD, PhD Diplomat, American Board of Psychiatry and Neurology (Neurology and Sleep Medicine)

## 2018-03-13 NOTE — Telephone Encounter (Signed)
Pt returned call. I advised pt that Dr. Frances Furbish reviewed their sleep study results and found that has sleep apnea and recommends that pt be treated with a cpap. Dr. Frances Furbish recommends that pt return for a repeat sleep study in order to properly titrate the cpap and ensure a good mask fit. Pt is agreeable to returning for a titration study. I advised pt that our sleep lab will file with pt's insurance and call pt to schedule the sleep study when we hear back from the pt's insurance regarding coverage of this sleep study. Pt verbalized understanding of results. Pt had no questions at this time but was encouraged to call back if questions arise.

## 2018-03-19 ENCOUNTER — Ambulatory Visit (INDEPENDENT_AMBULATORY_CARE_PROVIDER_SITE_OTHER): Payer: Medicare Other | Admitting: Neurology

## 2018-03-19 DIAGNOSIS — R51 Headache: Secondary | ICD-10-CM

## 2018-03-19 DIAGNOSIS — G4733 Obstructive sleep apnea (adult) (pediatric): Secondary | ICD-10-CM | POA: Diagnosis not present

## 2018-03-19 DIAGNOSIS — R519 Headache, unspecified: Secondary | ICD-10-CM

## 2018-03-19 DIAGNOSIS — G4761 Periodic limb movement disorder: Secondary | ICD-10-CM

## 2018-03-19 DIAGNOSIS — R9431 Abnormal electrocardiogram [ECG] [EKG]: Secondary | ICD-10-CM

## 2018-03-19 DIAGNOSIS — R413 Other amnesia: Secondary | ICD-10-CM

## 2018-03-19 DIAGNOSIS — R351 Nocturia: Secondary | ICD-10-CM

## 2018-03-19 DIAGNOSIS — G4720 Circadian rhythm sleep disorder, unspecified type: Secondary | ICD-10-CM

## 2018-03-19 DIAGNOSIS — R400 Somnolence: Secondary | ICD-10-CM

## 2018-03-20 ENCOUNTER — Telehealth: Payer: Self-pay

## 2018-03-20 NOTE — Addendum Note (Signed)
Addended by: Huston FoleyATHAR, Amarisa Wilinski on: 03/20/2018 08:38 AM   Modules accepted: Orders

## 2018-03-20 NOTE — Progress Notes (Signed)
Patient referred by Dr. Clelia CroftShaw, seen by me on 02/10/18, diagnostic PSG on 03/10/18.Patient had a CPAP titration study on 03/19/18.  Please call and inform patient that I have entered an order for treatment with positive airway pressure (PAP) treatment for obstructive sleep apnea (OSA). She did well during the latest sleep study with CPAP. We will, therefore, arrange for a machine for home use through a DME (durable medical equipment) company of Her choice; and I will see the patient back in follow-up in about 10 weeks. Please also explain to the patient that I will be looking out for compliance data, which can be downloaded from the machine (stored on an SD card, that is inserted in the machine) or via remote access through a modem, that is built into the machine. At the time of the followup appointment we will discuss sleep study results and how it is going with PAP treatment at home. Please advise patient to bring Her machine at the time of the first FU visit, even though this is cumbersome. Bringing the machine for every visit after that will likely not be needed, but often helps for the first visit to troubleshoot if needed. Please re-enforce the importance of compliance with treatment and the need for us to monitor compliance data - often an insurance requirement and actually good feedback for the patient as far as how they are doing.  Also remind patient, that any interim PAP machine or mask issues should be first addressed with the DME company, as they can often help better with technical and mask fit issues. Please ask if patient has a preference regarding DME company.  Please also make sure, the patient has a follow-up appointment with me in about 10 weeks from the setup date, thanks. May see one of our nurse practitioners if needed for proper timing of the FU appointment.  Please fax or rout report to the referring provider. Thanks,   Huston FoleySaima Kelty Szafran, MD, PhD [facility])

## 2018-03-20 NOTE — Telephone Encounter (Signed)
-----   Message from Huston FoleySaima Athar, MD sent at 03/20/2018  8:38 AM EDT ----- Patient referred by Dr. Clelia CroftShaw, seen by me on 02/10/18, diagnostic PSG on 03/10/18.Patient had a CPAP titration study on 03/19/18.  Please call and inform patient that I have entered an order for treatment with positive airway pressure (PAP) treatment for obstructive sleep apnea (OSA). She did well during the latest sleep study with CPAP. We will, therefore, arrange for a machine for home use through a DME (durable medical equipment) company of Her choice; and I will see the patient back in follow-up in about 10 weeks. Please also explain to the patient that I will be looking out for compliance data, which can be downloaded from the machine (stored on an SD card, that is inserted in the machine) or via remote access through a modem, that is built into the machine. At the time of the followup appointment we will discuss sleep study results and how it is going with PAP treatment at home. Please advise patient to bring Her machine at the time of the first FU visit, even though this is cumbersome. Bringing the machine for every visit after that will likely not be needed, but often helps for the first visit to troubleshoot if needed. Please re-enforce the importance of compliance with treatment and the need for us to monitor compliance data - often an insurance requirement and actually good feedback for the patient as far as how they are doing.  Also remind patient, that any interim PAP machine or mask issues should be first addressed with the DME company, as they can often help better with technical and mask fit issues. Please ask if patient has a preference regarding DME company.  Please also make sure, the patient has a follow-up appointment with me in about 10 weeks from the setup date, thanks. May see one of our nurse practitioners if needed for proper timing of the FU appointment.  Please fax or rout report to the referring provider. Thanks,    Huston FoleySaima Athar, MD, PhD [facility])

## 2018-03-20 NOTE — Procedures (Addendum)
PATIENT'S NAME:  Savannah Harris, [name] DOB:      10/13/1936      MR#:    [id number]     DATE OF RECORDING: 03/19/2018 REFERRING M.D.:  Martha ClanWilliam Shaw, MD Study Performed:   CPAP  Titration HISTORY: 82 year old woman with a history of diabetes, memory loss, hypertension, hyperlipidemia, reflux disease, diverticulitis, arthritis, and overweight state, who returns for a CPAP titration. Her baseline PSG on 03/10/2018 showed an AHI of 17.5, a REM AHI of 30, and a REM supine AHI of 32.2, O2 nadir of 84%. BMI of 26.2.  CURRENT MEDICATIONS: Ascorbic Acid, Aspirin, Canagliflozin, Vitamin D3, Cyclosporine, Dulaglutide, Glimepiride, Krill Oil, Metformin and Pravastatin.  PROCEDURE:  This is a multichannel digital polysomnogram utilizing the SomnoStar 11.2 system.  Electrodes and sensors were applied and monitored per AASM Specifications.   EEG, EOG, Chin and Limb EMG, were sampled at 200 Hz.  ECG, Snore and Nasal Pressure, Thermal Airflow, Respiratory Effort, CPAP Flow and Pressure, Oximetry was sampled at 50 Hz. Digital video and audio were recorded.      The patient was fitted with nasal pillows. CPAP was initiated at 5 cmH20 with heated humidity per AASM standards and pressure was advanced to 13 cmH20 because of hypopneas, apneas and desaturations. At a PAP pressure of 13 cmH20, there was a reduction of the AHI to 0 with supine NREM sleep achieved and O2 nadir of 94%.   Lights Out was at 22:32 and Lights On at 04:54. Total recording time (TRT) was 382.5 minutes, with a total sleep time (TST) of 275 minutes. The patient's sleep latency was 35.5 minutes. REM latency was 61 minutes, which is mildly reduced. The sleep efficiency was 71.9%, which is reduced.    SLEEP ARCHITECTURE: WASO (Wake after sleep onset) was 71.5 minutes with mild to moderate sleep fragmentation noted.  There were 9 minutes in Stage N1, 146 minutes Stage N2, 68.5 minutes Stage N3 and 51.5 minutes in Stage REM.  The percentage of Stage N1 was 3.3%,  Stage N2 was 53.1%, which is normal, Stage N3 was 24.9% and Stage R (REM sleep) was 18.7%, which is near normal. The arousals were noted as: 64 were spontaneous, 29 were associated with PLMs, 9 were associated with respiratory events.  RESPIRATORY ANALYSIS:  There was a total of 54 respiratory events: 25 obstructive apneas, 20 central apneas and 0 mixed apneas with a total of 45 apneas and an apnea index (AI) of 9.8 /hour. There were 9 hypopneas with a hypopnea index of 2./hour. The patient also had 0 respiratory event related arousals (RERAs).      The total APNEA/HYPOPNEA INDEX  (AHI) was 11.8/hour and the total RESPIRATORY DISTURBANCE INDEX was 11.8 0./hour  2 events occurred in REM sleep and 52 events in NREM. The REM AHI was 2.3 /hour versus a non-REM AHI of 14. 0./hour.  The patient spent 247 minutes of total sleep time in the supine position and 28 minutes in non-supine. The supine AHI was 10.6, versus a non-supine AHI of 21.4. OXYGEN SATURATION & C02:  The baseline 02 saturation was 96%, with the lowest being 86%. Time spent below 89% saturation equaled 1 minutes.  PERIODIC LIMB MOVEMENTS:  The patient had a total of 97 Periodic Limb Movements. The Periodic Limb Movement (PLM) index was 21.2 and the PLM Arousal index was 6.3/hour.  Audio and video analysis did not show any abnormal or unusual movements, behaviors, phonations or vocalizations. The patient took 1bathroom break. The EKG was in keeping  with normal sinus rhythm (NSR) with occasional PVCs noted. Post-study, the patient indicated that sleep was the same as usual.   IMPRESSION: 1. Obstructive Sleep Apnea (OSA) 2. Dysfunctions associated with sleep stages or arousal from sleep 3. Non-specific abnormal EKG  4. Periodic Limb Movement Disorder (PLMD)   RECOMMENDATIONS: 1. This study demonstrates resolution of the patient's obstructive sleep apnea with CPAP therapy. I will, therefore, start the patient on home CPAP treatment at a  pressure of 13 cm via nasal pillows with heated humidity. The patient should be reminded to be fully compliant with PAP therapy to improve sleep related symptoms and decrease long term cardiovascular risks. The patient should be reminded, that it may take up to 3 months to get fully used to using PAP with all planned sleep. The earlier full compliance is achieved, the better long term compliance tends to be. Please note that untreated obstructive sleep apnea may carry additional perioperative morbidity. Patients with significant obstructive sleep apnea should receive perioperative PAP therapy and the surgeons and particularly the anesthesiologist should be informed of the diagnosis and the severity of the sleep disordered breathing. 2. Mild PLMs (periodic limb movements of sleep) were noted during this study with mild arousals; clinical correlation is recommended.  3. The study showed occasional PVCs on single lead EKG; clinical correlation is recommended and consultation with cardiology may be feasible.  4. This study shows sleep fragmentation and abnormal sleep stage percentages; these are nonspecific findings and per se do not signify an intrinsic sleep disorder or a cause for the patient's sleep-related symptoms. Causes include (but are not limited to) the first night effect of the sleep study, circadian rhythm disturbances, medication effect or an underlying mood disorder or medical problem.  5. The patient should be cautioned not to drive, work at heights, or operate dangerous or heavy equipment when tired or sleepy. Review and reiteration of good sleep hygiene measures should be pursued with any patient. 6. The patient will be seen in follow-up by Dr. Frances [facility]urbish at [facility] for discussion of the test results and further management strategies. The referring provider will be notified of the test results.   I certify that I have reviewed the entire raw data recording prior to the issuance of this report in  accordance with the Standards of Accreditation of the American Academy of Sleep Medicine (AASM)   Huston Foley, MD, PhD Diplomat, American Board of Psychiatry and Neurology (Neurology and Sleep Medicine)

## 2018-03-20 NOTE — Telephone Encounter (Signed)
Pt returned RN's call °

## 2018-03-20 NOTE — Telephone Encounter (Signed)
I called pt. I advised pt that Dr. Frances FurbishAthar reviewed their sleep study results. Dr. Frances FurbishAthar recommends that pt start a cpap at home. I reviewed PAP compliance expectations with the pt. Pt is agreeable to starting a CPAP. I advised pt that an order will be sent to a DME, [facility], and [facility] will call the pt within about one week after they file with the pt's insurance. [facility] will show the pt how to use the machine, fit for masks, and troubleshoot the CPAP if needed. A follow up appt was made for insurance purposes with Dr. Frances FurbishAthar on 07/01/18 at 1:00pm. Pt verbalized understanding to arrive 15 minutes early and bring their CPAP. A letter with all of this information in it will be mailed to the pt as a reminder. I verified with the pt that the address we have on file is correct. Pt verbalized understanding of results. Pt had no questions at this time but was encouraged to call back if questions arise.

## 2018-03-20 NOTE — Telephone Encounter (Signed)
I called pt to discuss her sleep study results. No answer, left a message asking her to call me back. 

## 2018-03-24 ENCOUNTER — Encounter

## 2018-03-24 ENCOUNTER — Institutional Professional Consult (permissible substitution): Payer: Self-pay | Admitting: Neurology

## 2018-04-23 ENCOUNTER — Telehealth: Payer: Self-pay | Admitting: Neurology

## 2018-04-23 NOTE — Telephone Encounter (Signed)
I called pt. She has been very unsatisfied with [facility] and actually returned her cpap because she was so unsatisfied with their customer service. She is wanting to switch to another DME. I recommended [facility].  [facility] and I explained that they will contact her. [facility] will [facility] me. Hopefully, [facility] will be able to obtain authorization with pt's insurance for a cpap. Pt verbalized understanding.

## 2018-04-23 NOTE — Telephone Encounter (Signed)
Pt requesting a call, stating she is having problems with her CPAP machine. Pt did not wish to discus further with me.

## 2018-04-29 IMAGING — MG 2D DIGITAL DIAGNOSTIC UNILATERAL RIGHT MAMMOGRAM WITH CAD AND AD
5 series · 6 of 9 positions shown · non-contrast
Comparison: Previous exam(s).

CLINICAL DATA: Screening recall for possible right breast mass.

EXAM:
2D DIGITAL DIAGNOSTIC UNILATERAL RIGHT MAMMOGRAM WITH CAD AND
ADJUNCT TOMO

[R CC synth-2D]
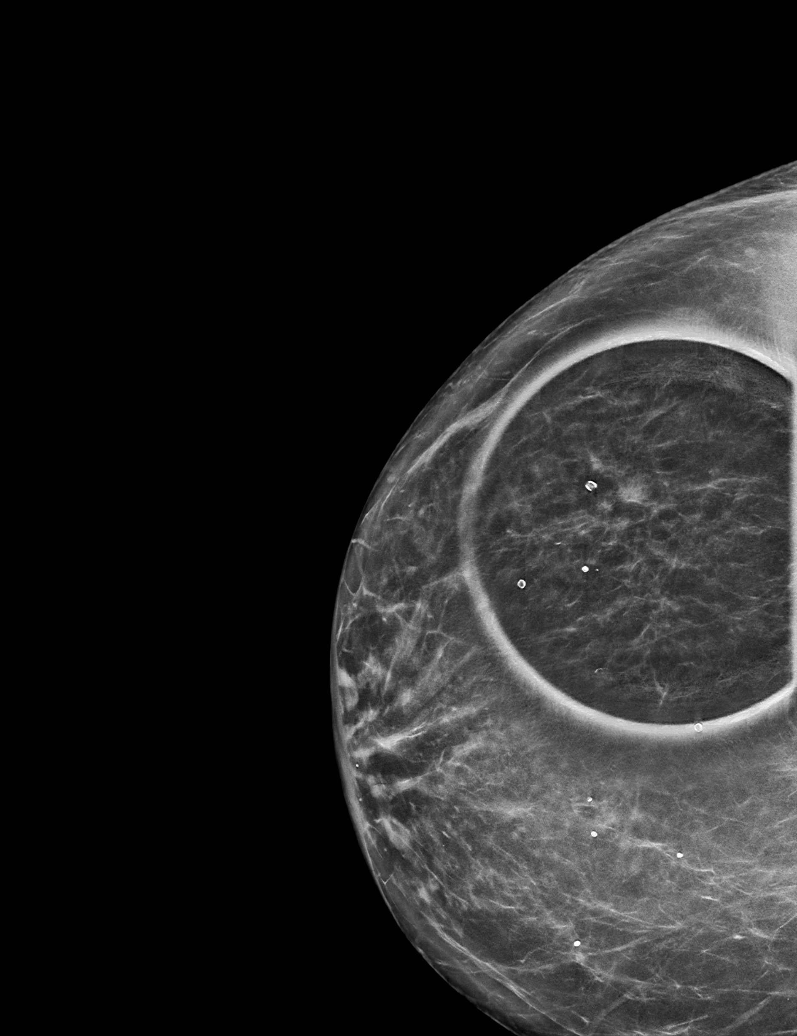

[R MLO]
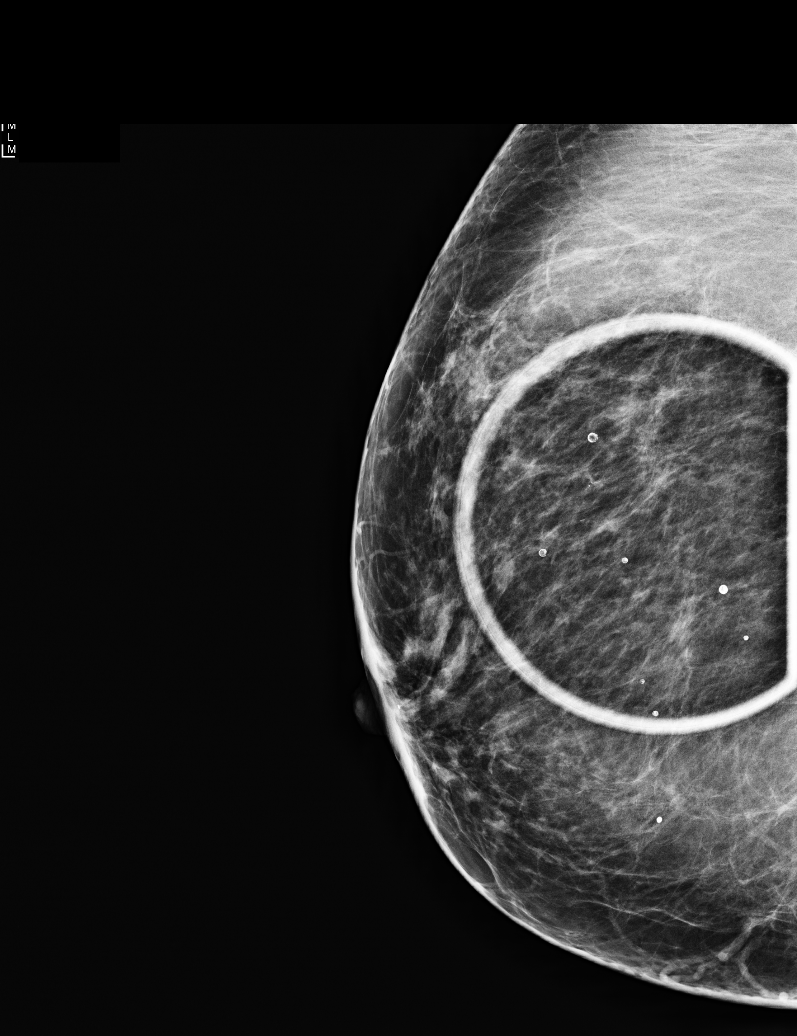

[R CC]
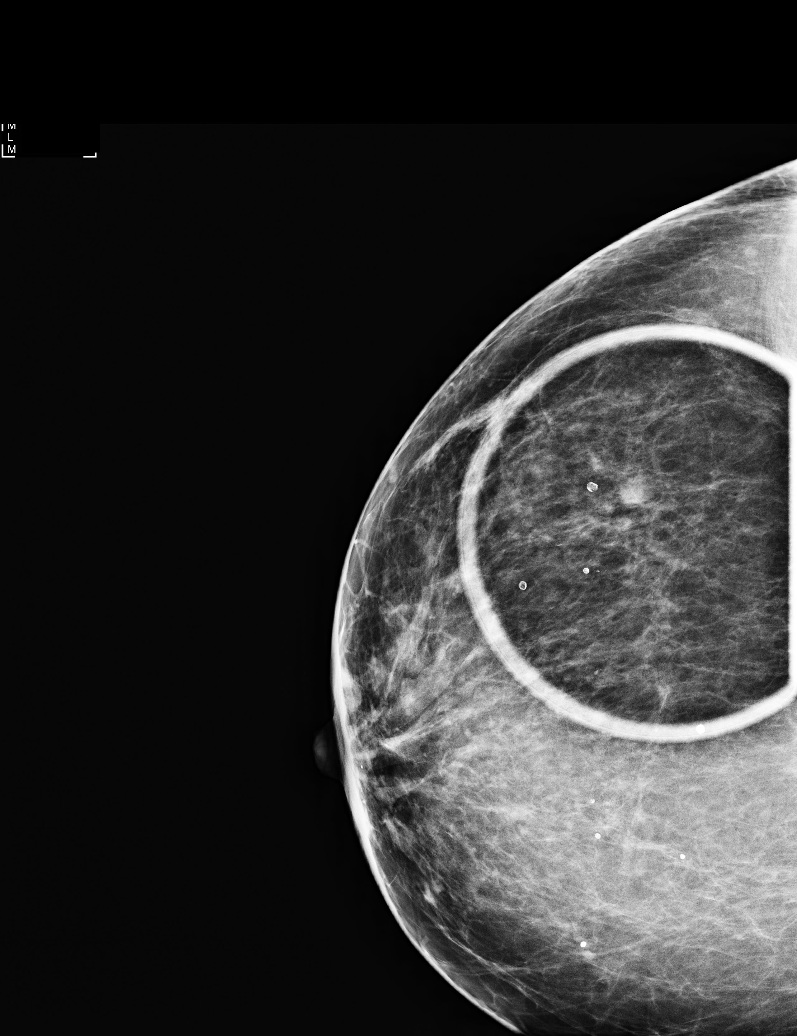

[R MLO synth-2D]
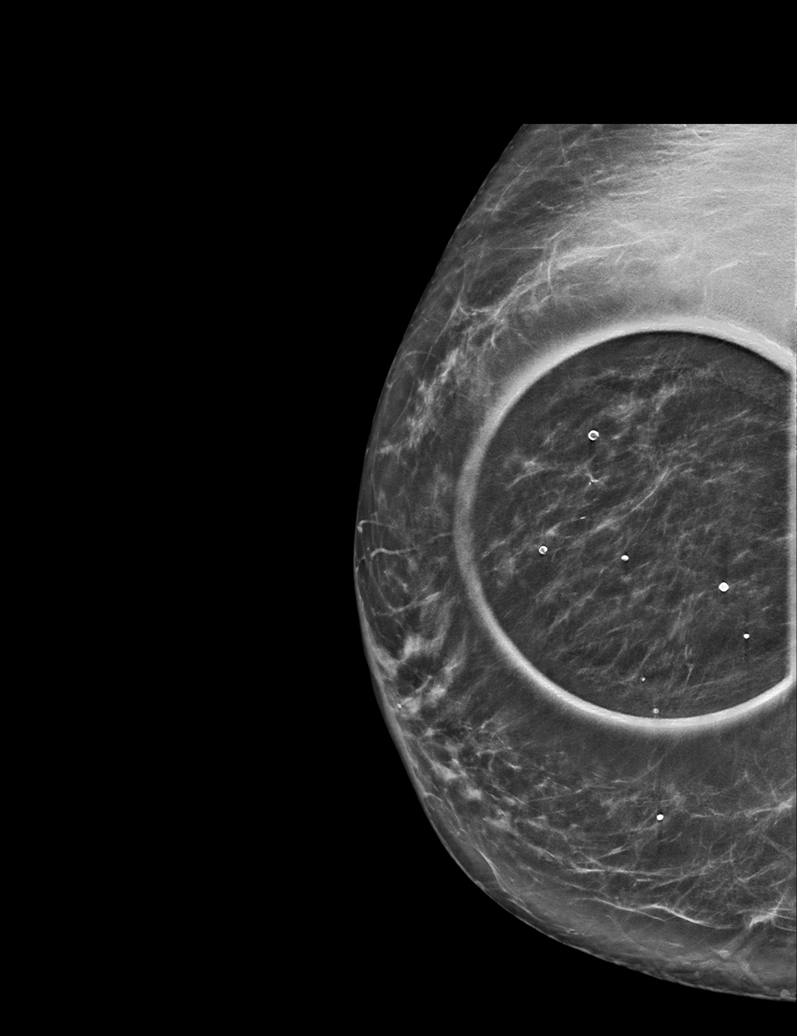

[R MLO tomo · 2 of 67 frames shown]
[frame 22/67]
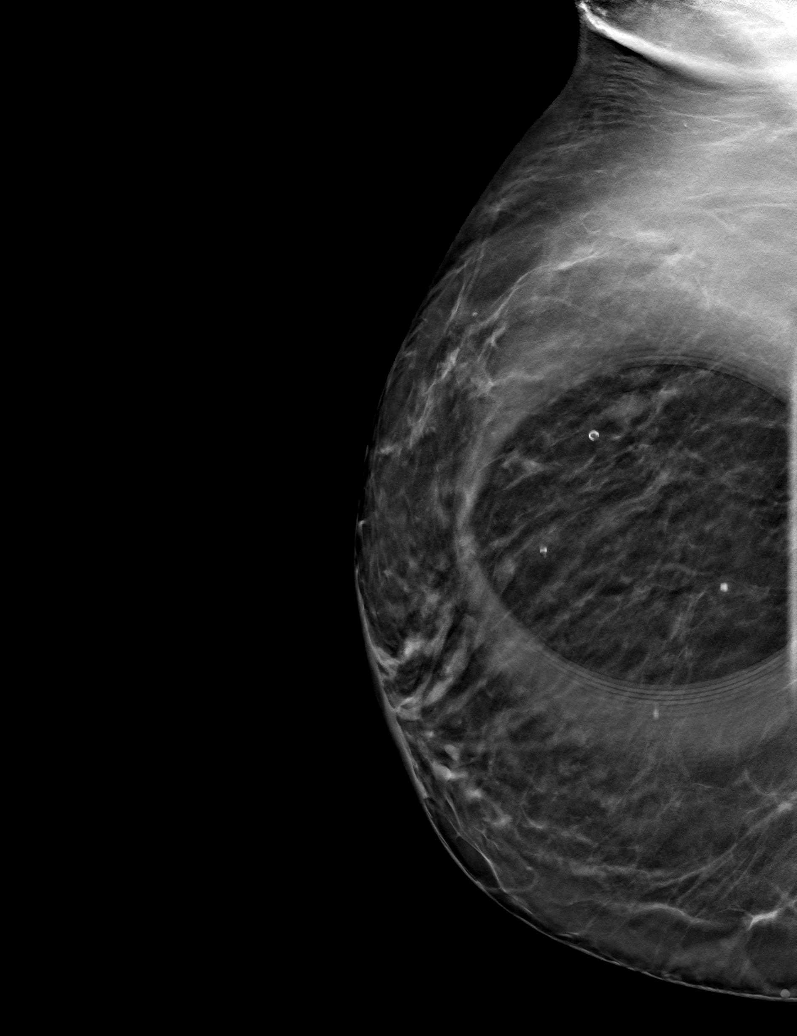
[frame 34/67]
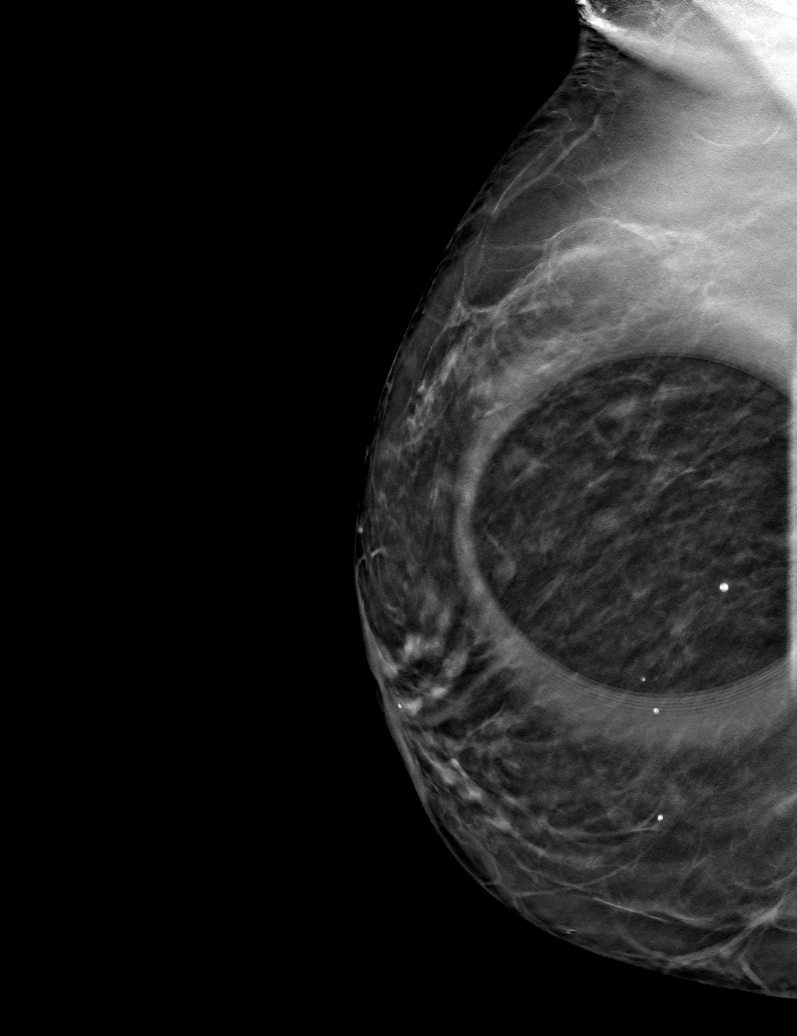

[6 of 9 positions shown; findings below may reference images not displayed]

ACR Breast Density Category b: There are scattered areas of
fibroglandular density.
FINDINGS: On the diagnostic 2D and 3D images, a possible mass in the upper
outer right breast persists. Mass is partly defined, oval in shape,
relatively lucent, measuring 7 mm in long axis. There are no
associated calcifications. There is no architectural distortion.

Mammographic images were processed with CAD.
IMPRESSION: 1. Indeterminate mass in the upper outer right breast. The patient
did not have an order for breast ultrasound, and this could not be
obtained at the time of the patient's diagnostic mammography. The
patient will be scheduled to return for right breast ultrasound to
complete her diagnostic exam.

RECOMMENDATION:
Right breast ultrasound.

I have discussed the findings and recommendations with the patient.
Results were also provided in writing at the conclusion of the
visit. If applicable, a reminder letter will be sent to the patient
regarding the next appointment.

BI-RADS CATEGORY  0: Incomplete. Need additional imaging evaluation
and/or prior mammograms for comparison.

## 2018-05-03 IMAGING — US ULTRASOUND RIGHT BREAST LIMITED
1 series · 6 of 6 positions shown · non-contrast
Comparison: Previous exam(s).

CLINICAL DATA: Right breast nodule seen on most recent screening
mammography.

EXAM:
ULTRASOUND OF THE RIGHT BREAST

[Series 1: ultrasound right breast limited · 0.07mm/px · 6 of 6 slices shown]
[im 1/6]
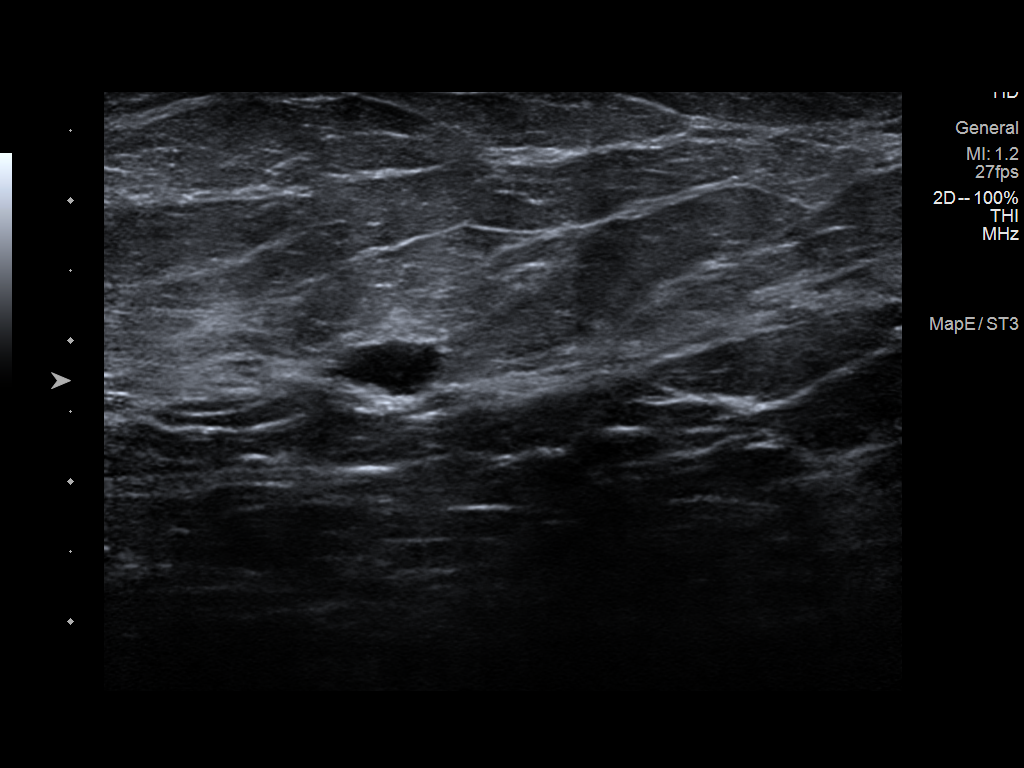
[im 2/6]
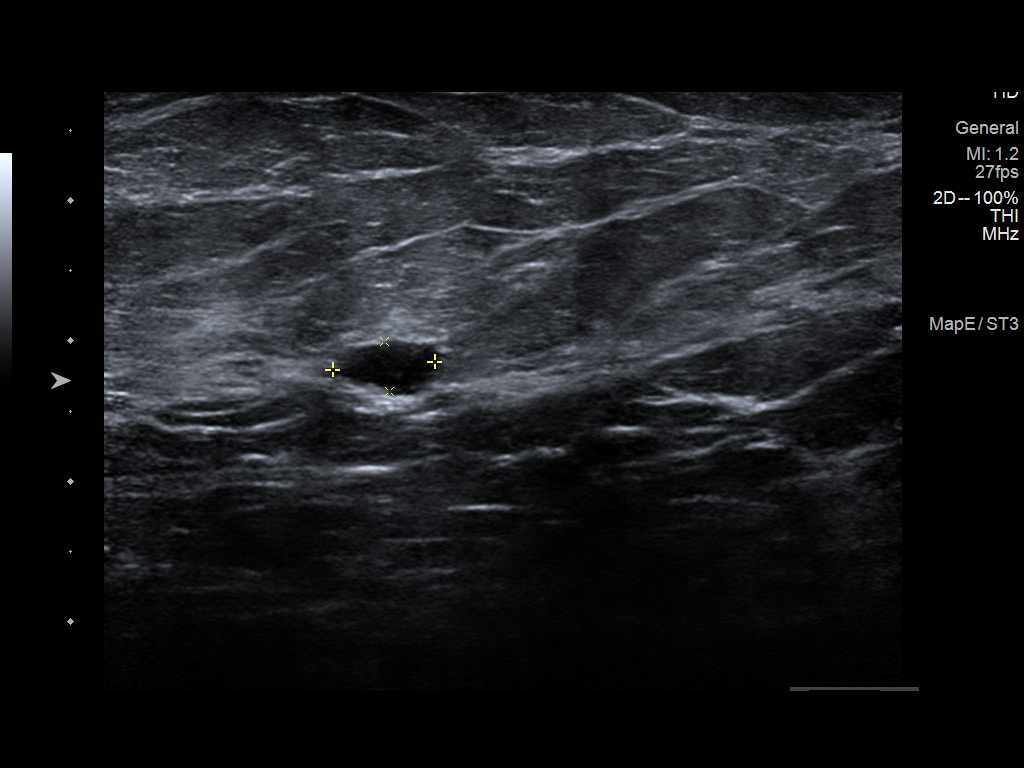
[im 3/6]
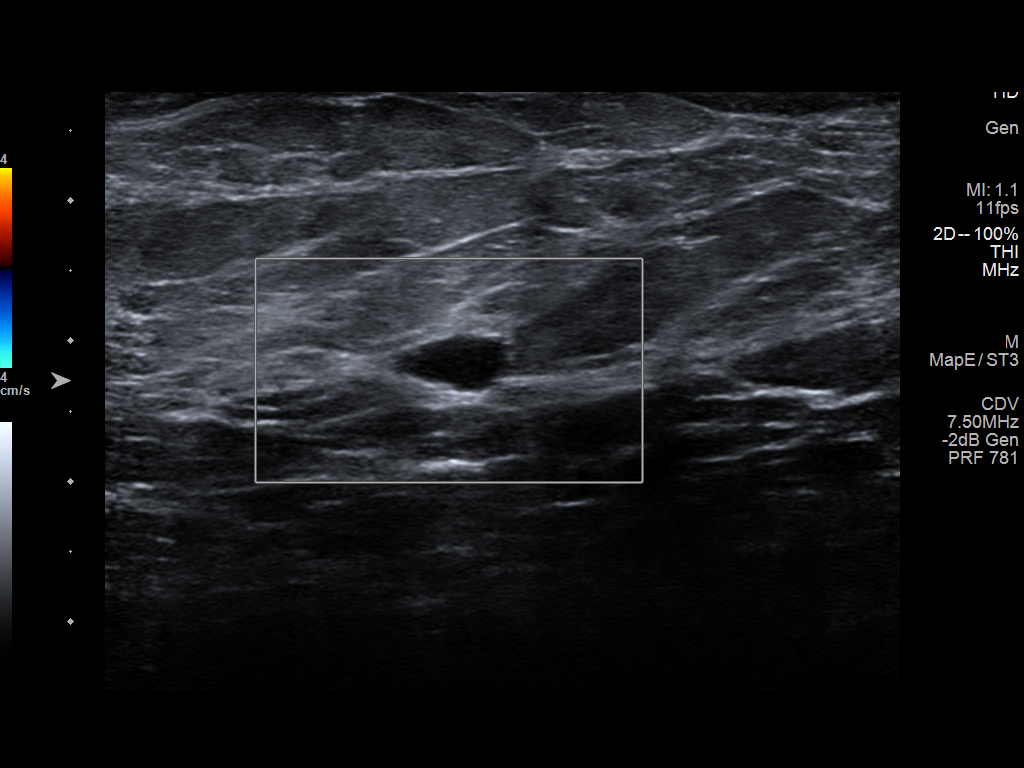
[im 4/6]
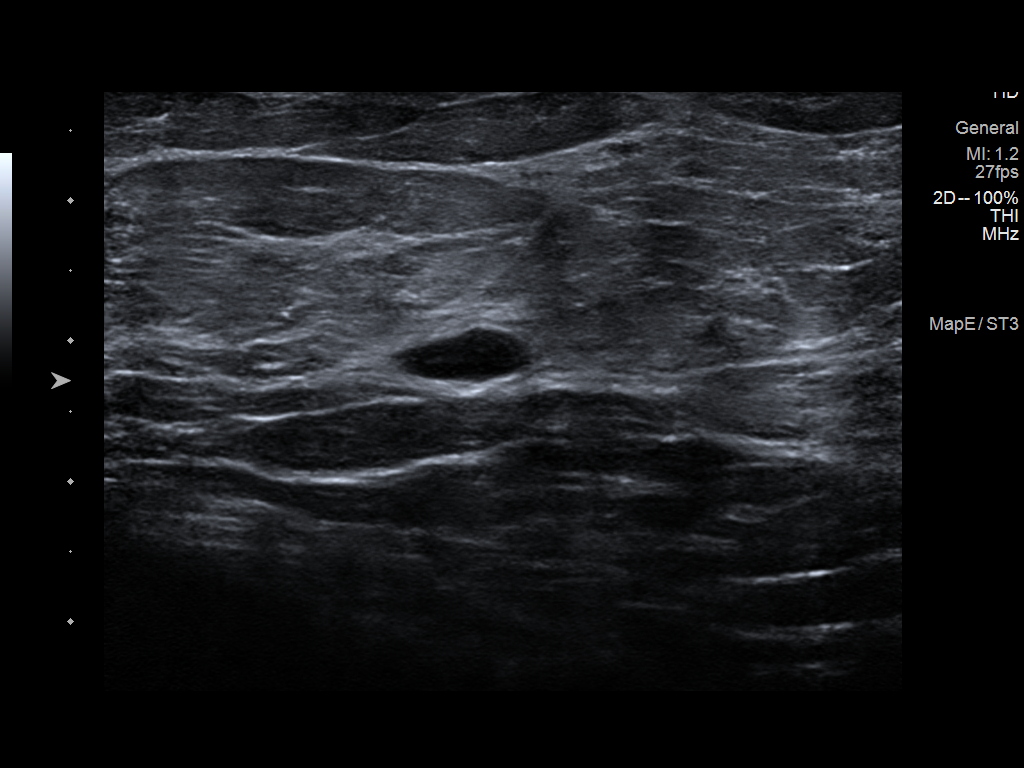
[im 5/6]
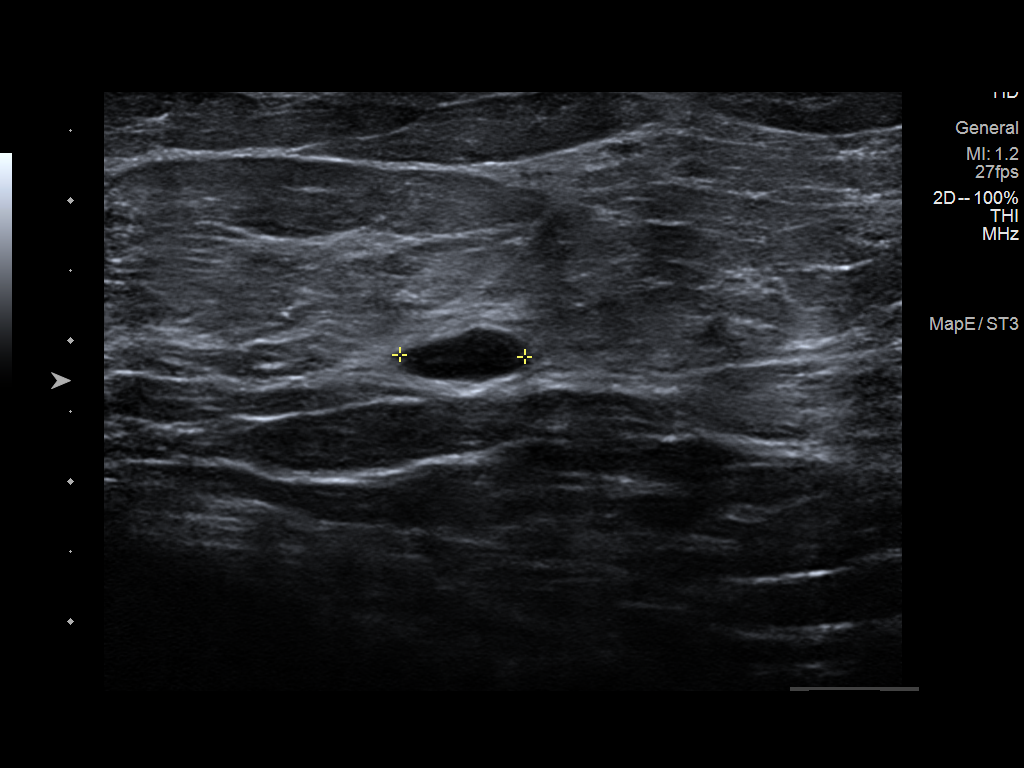
[im 6/6]
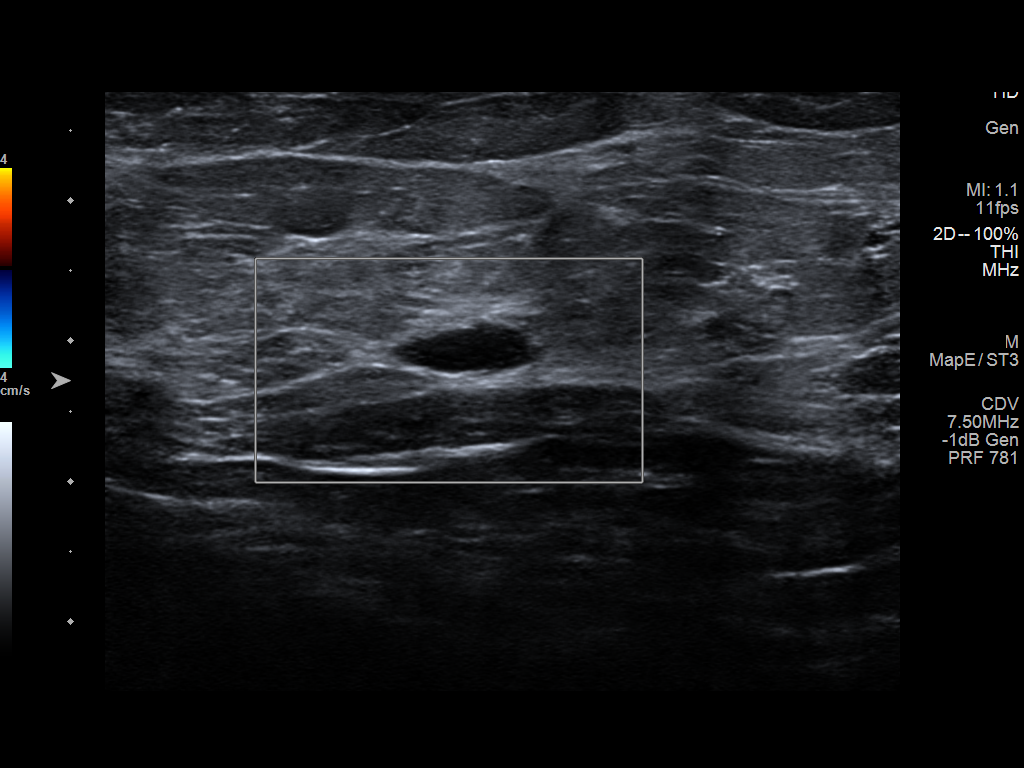

[6 of 6 positions shown; findings below may reference images not displayed]

FINDINGS: On physical exam, no suspicious masses are palpated.

Targeted ultrasound is performed, showing right breast 830 o'clock 5
cm from the nipple benign-appearing hypoechoic circumscribed
horizontally oriented nodule measuring 0.7 x 0.4 x 0.9 cm. This
finding corresponds to the mammographically seen lateral nodule. No
internal blood flow.
IMPRESSION: Right breast 830 o'clock probably benign nodule, for which six-month
follow-up is recommended.

RECOMMENDATION:
Diagnostic mammogram and possibly ultrasound of the right breast in
6 months. (Code:[id number])

I have discussed the findings and recommendations with the patient.
Results were also provided in writing at the conclusion of the
visit. If applicable, a reminder letter will be sent to the patient
regarding the next appointment.

BI-RADS CATEGORY  3: Probably benign.

## 2018-05-28 ENCOUNTER — Other Ambulatory Visit: Payer: Self-pay | Admitting: Internal Medicine

## 2018-05-28 DIAGNOSIS — M8580 Other specified disorders of bone density and structure, unspecified site: Secondary | ICD-10-CM

## 2018-06-05 ENCOUNTER — Encounter: Payer: Self-pay | Admitting: Neurology

## 2018-06-08 ENCOUNTER — Encounter: Payer: Self-pay | Admitting: Neurology

## 2018-06-29 ENCOUNTER — Encounter: Payer: Self-pay | Admitting: Neurology

## 2018-07-01 ENCOUNTER — Ambulatory Visit: Payer: Medicare Other | Admitting: Neurology

## 2018-07-01 ENCOUNTER — Encounter: Payer: Self-pay | Admitting: Neurology

## 2018-07-01 VITALS — BP 122/61 | HR 74 | Ht 60.0 in | Wt 134.0 lb

## 2018-07-01 DIAGNOSIS — Z789 Other specified health status: Secondary | ICD-10-CM | POA: Diagnosis not present

## 2018-07-01 NOTE — Patient Instructions (Signed)
You have not been able to use the CPAP, but I appreciate you trying for this long. I can refer you to dentistry for consideration for an oral appliance, we will see if your own dentist can do this first. Please call our office, if you need a referral, I would be happy to put in the referral.  At this point, we will have you return your machine to [facility]. I have placed a D/C order in your chart.  I would be happy to see you back as needed.

## 2018-07-01 NOTE — Progress Notes (Signed)
[facility]

## 2018-07-01 NOTE — Progress Notes (Signed)
Subjective:    Patient ID: Savannah Harris is a 82 y.o. female.  HPI     Interim history:   Savannah Harris is a 82 year old right-handed woman with an underlying medical history of diabetes, memory loss, hypertension, hyperlipidemia, reflux disease, diverticulitis, arthritis, and overweight state, who presents for follow-up consultation of her obstructive sleep apnea, after sleep studies and starting CPAP therapy at home. The patient is accompanied by her husband today. I first met her on 02/10/2018 at the request of her primary care physician, at which time she reported a prior diagnosis of obstructive sleep apnea and she indicated significant daytime somnolence. She was advised to proceed with sleep study testing. She had a baseline sleep study, followed by a CPAP titration study. I went over her test results with her in detail today. Baseline sleep study from 03/10/2018 showed a sleep latency of 23.5 minutes, REM latency 67.5 minutes and sleep efficiency was 83.7%, she had an increased percentage of stage II sleep, near absence of slow-wave sleep and REM sleep was mildly increased at 27.4%, total AHI was in the moderate range at 17.5 per hour, REM AHI was 30 per hour and supine AHI was 32.2 per hour, average oxygen saturation was 94%, nadir was 84%. She had no significant PLMS. She was advised to proceed with CPAP therapy and pursue a sleep study with CPAP titration. She had this on 03/19/2018. Sleep efficiency was 71.9%, sleep latency 35.5 minutes and REM latency 61 minutes. She had slow-wave sleep at 24.9% and REM sleep was 18.7%. She was titrated on CPAP via nasal pillows from 5 cm to 13 cm. On the final pressure her AHI was 0 per hour with supine non-REM sleep achieved an O2 nadir of 94%. Based on her test results I prescribed CPAP therapy for home use at a pressure of 13 cm.  Today, 07/01/2018: I reviewed her CPAP compliance data for the last 30 days from 05/31/2018 through 06/29/2018, during which  time she used her CPAP 27 days with percent used days greater than 4 hours at 47%, indicating suboptimal compliance with an average usage for days on treatment of 4 hours and 18 minutes, residual AHI suboptimal at 12.7 per hour, leaked on the lower side with the 95th percentile at 6.6 L/m on a pressure of 13 cm with EPR of 2. Her wrist 30 day compliance was 73% during 05/07/2018 through 06/05/2018. Her compliance has declined in the past month. She reports she cannot tolerate the CPAP, still struggles on a night to night basis and has not benefited from it. She feels that none of her daytime symptoms her nighttime sleep quality have improved. Would like to discontinue CPAP.   Previously:   02/10/2018: (She) reports snoring and excessive daytime somnolence. She had a sleep study several years ago, results are not available for my review today. She previously saw Dr. Maxwell Caul for sleep. She was diagnosed with obstructive sleep apnea as I understand. She does not have a CPAP machine. I reviewed your office note from 01/28/2018. Her Epworth sleepiness score is 19 out of 24 today, fatigue score is 45 out of 63. She is married and lives with her husband, she is retired, they have 2 children. She is a nonsmoker and does not utilize alcohol, does not drink caffeine daily, occasional soda.   Her bedtime is typically around 10 or 10:30 PM, rise time around 6:30 AM. She has had rare morning headaches, used to have more morning headaches when she still suffered  from migraine headaches. She has nocturia about 2-3 times per average night, is not aware of any family history of OSA, had a tonsillectomy as child. She has difficulty falling asleep and staying asleep. She has tried over-the-counter medication including simple Tylenol, Tylenol arthritis, Tylenol PM and melatonin, the melatonin helped temporarily. She has fallen asleep at a stop light. She is aware that she is sleepy enough to have concerns for driving. She was  told in the past that she may have mild sleep apnea but it may not require treatment. She did not pursue CPAP therapy for that reason. She denies telltale symptoms of restless leg syndrome but has had leg cramps.   Her Past Medical History Is Significant For: Past Medical History:  Diagnosis Date  . Arthritis    WRIST/ HANDS  . Asymptomatic vertebral artery stenosis    RIGHT SIDE  . Diverticulitis 11-30-13   during Christmas holiday- resolved  . GERD (gastroesophageal reflux disease)   . Hyperlipidemia   . Hypertension   . Leg cramps    OCCASIONAL  . Sleep apnea 11-30-13   mild-no cpap.  . Type 2 diabetes mellitus ([place])   . Urge urinary incontinence     Her Past Surgical History Is Significant For: Past Surgical History:  Procedure Laterality Date  . CATARACT EXTRACTION W/ INTRAOCULAR LENS  IMPLANT, BILATERAL    . COLONOSCOPY WITH PROPOFOL N/A 12/15/2013   Procedure: COLONOSCOPY WITH PROPOFOL;  Surgeon: Garlan Fair, MD;  Location: [facility];  Service: Endoscopy;  Laterality: N/A;  . INTERSTIM IMPLANT PLACEMENT N/A 07/28/2013   Procedure: Barrie Lyme IMPLANT FIRST STAGE;  Surgeon: Reece Packer, MD;  Location: [facility];  Service: Urology;  Laterality: N/A;  . INTERSTIM IMPLANT PLACEMENT N/A 07/28/2013   Procedure: Barrie Lyme IMPLANT SECOND STAGE;  Surgeon: Reece Packer, MD;  Location: [facility];  Service: Urology;  Laterality: N/A;  . LEFT WRSIT TENDON REPAIR  1999  . PUBOVAGINAL SLING  08-26-2007   [place]  . TONSILLECTOMY AND ADENOIDECTOMY  AS CHILD  . TOTAL ABDOMINAL HYSTERECTOMY W/ BILATERAL SALPINGOOPHORECTOMY  1970s   AND APPENDECTOMY  . TOTAL HIP ARTHROPLASTY Right 12-16-2008    Her Family History Is Significant For: Family History  Problem Relation Age of Onset  . Diabetes Mother   . Alzheimer's disease Mother   . Dementia Mother     Her Social History Is Significant For: Social History   Socioeconomic  History  . Marital status: Married    Spouse name: Not on file  . Number of children: Not on file  . Years of education: Not on file  . Highest education level: Not on file  Occupational History  . Not on file  Social Needs  . Financial resource strain: Not on file  . Food insecurity:    Worry: Not on file    Inability: Not on file  . Transportation needs:    Medical: Not on file    Non-medical: Not on file  Tobacco Use  . Smoking status: Never Smoker  . Smokeless tobacco: Never Used  Substance and Sexual Activity  . Alcohol use: No    Alcohol/week: 0.0 standard drinks  . Drug use: No  . Sexual activity: Not Currently  Lifestyle  . Physical activity:    Days per week: Not on file    Minutes per session: Not on file  . Stress: Not on file  Relationships  . Social connections:    Talks on phone:  Not on file    Gets together: Not on file    Attends religious service: Not on file    Active member of club or organization: Not on file    Attends meetings of clubs or organizations: Not on file    Relationship status: Not on file  Other Topics Concern  . Not on file  Social History Narrative  . Not on file    Her Allergies Are:  Allergies  Allergen Reactions  . Latex Other (See Comments)    BLISTERS  . Canagliflozin     Pt states Invokana causes dizziness  . Adhesive [Tape] Other (See Comments)    BLISTERS  . Codeine Nausea And Vomiting  :   Her Current Medications Are:  Outpatient Encounter Medications as of 07/01/2018  Medication Sig  . aspirin EC 81 MG tablet Take 81 mg by mouth at bedtime.  Marland Kitchen CANAGLIFLOZIN PO Take by mouth.  . Cholecalciferol (VITAMIN D3) 5000 UNITS CAPS Take 5,000 Units by mouth daily.   . cycloSPORINE (RESTASIS) 0.05 % ophthalmic emulsion Place 1 drop into both eyes 2 (two) times daily. May do addition dose  . Dulaglutide (TRULICITY) 1.5 DX/8.3JA SOPN Inject into the skin.  Marland Kitchen glimepiride (AMARYL) 4 MG tablet Take 4 mg by mouth daily with  breakfast.  . KRILL OIL PO Take by mouth.  . metFORMIN (GLUMETZA) 1000 MG (MOD) 24 hr tablet Take 2 tablets by mouth daily.  Marland Kitchen PRAVASTATIN SODIUM PO Take by mouth.  . [DISCONTINUED] Ascorbic Acid (VITAMIN C) 1000 MG tablet Take 1,000 mg by mouth daily.  . [DISCONTINUED] Dulaglutide (TRULICITY) 2.50 NL/9.7QB SOPN Inject 1.5 mg into the skin once a week.    No facility-administered encounter medications on file as of 07/01/2018.   :  Review of Systems:  Out of a complete 14 point review of systems, all are reviewed and negative with the exception of these symptoms as listed below:  Review of Systems  Neurological:       Pt presents today to discuss her cpap. Pt is "disgusted" by her cpap.    Objective:  Neurological Exam  Physical Exam Physical Examination:   Vitals:   07/01/18 1256  BP: 122/61  Pulse: 74   General Examination: The patient is a very pleasant 82 y.o. female in no acute distress. She appears well-developed and well-nourished and well groomed.   HEENT: Normocephalic, atraumatic, pupils are equal, round and reactive to light and accommodation. Extraocular tracking is good without limitation to gaze excursion or nystagmus noted. Normal smooth pursuit is noted. Hearing is grossly intact. Face is symmetric with normal facial animation and normal facial sensation. Speech is clear with no dysarthria noted. There is no hypophonia. There is no lip, neck/head, jaw or voice tremor. Neck shows FROM. Oropharynx exam reveals no new findings.   Chest: Clear to auscultation without wheezing, rhonchi or crackles noted.  Heart: S1+S2+0, regular and normal without murmurs, rubs or gallops noted.   Abdomen: Soft, non-tender and non-distended.  Extremities: There is no obvious edema.  Skin: Warm and dry without trophic changes noted.  Musculoskeletal: exam reveals no obvious joint deformities, tenderness or joint swelling or erythema.   Neurologically:  Mental status: The  patient is awake, alert and oriented in all 4 spheres. Her immediate and remote memory, attention, language skills and fund of knowledge are appropriate. There is no evidence of aphasia, agnosia, apraxia or anomia. Speech is clear with normal prosody and enunciation. Thought process is linear. Mood is normal  and affect is normal.  Cranial nerves II - XII are as described above under HEENT exam.  Motor exam: Normal bulk, strength and tone is noted. There is no tremor. Fine motor skills and coordination: grossly intact.  Cerebellar testing: No dysmetria or intention tremor. There is no truncal or gait ataxia.  Sensory exam: intact to LT.   Gait, station and balance: She stands easily. No veering to one side is noted. No leaning to one side is noted. Posture is age-appropriate and stance is narrow based. Gait shows normal stride length and pace. No problems turning are noted.                Assessment and Plan:   In summary, JASNOOR TRUSSELL is a very pleasant 82 year old female with an underlying medical history of diabetes, memory loss, hypertension, hyperlipidemia, reflux disease, diverticulitis, arthritis, and overweight state, who presents for follow-up consultation of her obstructive sleep apnea. Her sleep study in May 2019 indicated moderate obstructive sleep apnea and she return for a full night CPAP titration study in June 2019. Unfortunately, she has been struggling with CPAP. She has fulfilled compliance criteria but is struggling and a night to night basis with tolerance and issues with the mask. She does not feel improved with respect to her sleep quality and daytime symptoms. She would like to forego treatment with positive airway pressure at this time. I suggested we seek alternative treatment options particularly in the form of a dental device if possible. She would like to talk to her own dentist about this first. If we need to make a referral I would be happy to make a referral to her  dentist or someone else she might like to see or we can help her with referral for oral appliance treatment consideration to a dentist locally. I will request a DC CPAP order through her DME company. At this juncture, she is agreeable to pursuing as needed follow-up with me. Answered all their questions today and the patient and her husband were in agreement. I reiterated the importance of trying to pursue some form of treatment for moderate obstructive sleep apnea. She demonstrated understanding. I spent 25 minutes in total face-to-face time with the patient, more than 50% of which was spent in counseling and coordination of care, reviewing test results, reviewing medication and discussing or reviewing the diagnosis of OSA, prognosis and treatment options. Pertinent laboratory and imaging test results that were available during this visit with the patient were reviewed by me and considered in my medical decision making (see chart for details).

## 2018-07-21 ENCOUNTER — Ambulatory Visit
Admission: RE | Admit: 2018-07-21 | Discharge: 2018-07-21 | Disposition: A | Discharge status: Home / Self Care | Payer: Medicare Other | Source: Ambulatory Visit | Attending: Family Medicine | Admitting: Family Medicine

## 2018-07-21 ENCOUNTER — Other Ambulatory Visit: Payer: Medicare Other

## 2018-07-21 ENCOUNTER — Ambulatory Visit
Admission: RE | Admit: 2018-07-21 | Discharge: 2018-07-21 | Disposition: A | Discharge status: Home / Self Care | Payer: Medicare Other | Source: Ambulatory Visit | Attending: Internal Medicine | Admitting: Internal Medicine

## 2018-07-21 DIAGNOSIS — N63 Unspecified lump in unspecified breast: Secondary | ICD-10-CM

## 2018-07-21 DIAGNOSIS — M8580 Other specified disorders of bone density and structure, unspecified site: Secondary | ICD-10-CM

## 2019-03-19 ENCOUNTER — Emergency Department (HOSPITAL_COMMUNITY)
Admission: EM | Admit: 2019-03-19 | Discharge: 2019-03-19 | Disposition: A | Discharge status: Home / Self Care | Payer: Medicare Other | Attending: Emergency Medicine | Admitting: Emergency Medicine

## 2019-03-19 ENCOUNTER — Other Ambulatory Visit: Payer: Self-pay

## 2019-03-19 ENCOUNTER — Emergency Department (HOSPITAL_COMMUNITY): Payer: Medicare Other

## 2019-03-19 ENCOUNTER — Encounter (HOSPITAL_COMMUNITY): Payer: Self-pay

## 2019-03-19 DIAGNOSIS — Y9301 Activity, walking, marching and hiking: Secondary | ICD-10-CM | POA: Insufficient documentation

## 2019-03-19 DIAGNOSIS — W1830XA Fall on same level, unspecified, initial encounter: Secondary | ICD-10-CM | POA: Insufficient documentation

## 2019-03-19 DIAGNOSIS — S6992XA Unspecified injury of left wrist, hand and finger(s), initial encounter: Secondary | ICD-10-CM | POA: Diagnosis present

## 2019-03-19 DIAGNOSIS — Z7984 Long term (current) use of oral hypoglycemic drugs: Secondary | ICD-10-CM | POA: Insufficient documentation

## 2019-03-19 DIAGNOSIS — S52599B Other fractures of lower end of unspecified radius, initial encounter for open fracture type I or II: Secondary | ICD-10-CM

## 2019-03-19 DIAGNOSIS — Z7982 Long term (current) use of aspirin: Secondary | ICD-10-CM | POA: Diagnosis not present

## 2019-03-19 DIAGNOSIS — E119 Type 2 diabetes mellitus without complications: Secondary | ICD-10-CM | POA: Insufficient documentation

## 2019-03-19 DIAGNOSIS — S52602B Unspecified fracture of lower end of left ulna, initial encounter for open fracture type I or II: Secondary | ICD-10-CM | POA: Diagnosis not present

## 2019-03-19 DIAGNOSIS — Z1159 Encounter for screening for other viral diseases: Secondary | ICD-10-CM | POA: Diagnosis not present

## 2019-03-19 DIAGNOSIS — Y92480 Sidewalk as the place of occurrence of the external cause: Secondary | ICD-10-CM | POA: Insufficient documentation

## 2019-03-19 DIAGNOSIS — Y999 Unspecified external cause status: Secondary | ICD-10-CM | POA: Insufficient documentation

## 2019-03-19 DIAGNOSIS — Z79899 Other long term (current) drug therapy: Secondary | ICD-10-CM | POA: Insufficient documentation

## 2019-03-19 DIAGNOSIS — I10 Essential (primary) hypertension: Secondary | ICD-10-CM | POA: Diagnosis not present

## 2019-03-19 DIAGNOSIS — S52592B Other fractures of lower end of left radius, initial encounter for open fracture type I or II: Secondary | ICD-10-CM | POA: Insufficient documentation

## 2019-03-19 LAB — BASIC METABOLIC PANEL
Anion gap: 12 (ref 5–15)
BUN: 25 mg/dL — ABNORMAL HIGH (ref 8–23)
CO2: 26 mmol/L (ref 22–32)
Calcium: 9.3 mg/dL (ref 8.9–10.3)
Chloride: 104 mmol/L (ref 98–111)
Creatinine, Ser: 1.25 mg/dL — ABNORMAL HIGH (ref 0.44–1.00)
GFR calc Af Amer: 47 mL/min — ABNORMAL LOW (ref >60)
GFR calc non Af Amer: 41 mL/min — ABNORMAL LOW (ref >60)
Glucose, Bld: 197 mg/dL — ABNORMAL HIGH (ref 70–99)
Potassium: 4.2 mmol/L (ref 3.5–5.1)
Sodium: 142 mmol/L (ref 135–145)

## 2019-03-19 LAB — CBC WITH DIFFERENTIAL/PLATELET
Abs Immature Granulocytes: 0.04 10*3/uL (ref 0.00–0.07)
Basophils Absolute: 0 10*3/uL (ref 0.0–0.1)
Basophils Relative: 0 %
Eosinophils Absolute: 0.1 10*3/uL (ref 0.0–0.5)
Eosinophils Relative: 1 %
HCT: 38.3 % (ref 36.0–46.0)
Hemoglobin: 12.3 g/dL (ref 12.0–15.0)
Immature Granulocytes: 1 %
Lymphocytes Relative: 13 %
Lymphs Abs: 0.9 10*3/uL (ref 0.7–4.0)
MCH: 29.3 pg (ref 26.0–34.0)
MCHC: 32.1 g/dL (ref 30.0–36.0)
MCV: 91.2 fL (ref 80.0–100.0)
Monocytes Absolute: 0.4 10*3/uL (ref 0.1–1.0)
Monocytes Relative: 6 %
Neutro Abs: 5.3 10*3/uL (ref 1.7–7.7)
Neutrophils Relative %: 79 %
Platelets: 128 10*3/uL — ABNORMAL LOW (ref 150–400)
RBC: 4.2 MIL/uL (ref 3.87–5.11)
RDW: 12.5 % (ref 11.5–15.5)
WBC: 6.6 10*3/uL (ref 4.0–10.5)
nRBC: 0 % (ref 0.0–0.2)

## 2019-03-19 LAB — SARS CORONAVIRUS 2 BY RT PCR (HOSPITAL ORDER, PERFORMED IN ~~LOC~~ HOSPITAL LAB): SARS Coronavirus 2: NEGATIVE

## 2019-03-19 MED ORDER — MORPHINE SULFATE (PF) 4 MG/ML IV SOLN
4.0000 mg | Freq: Once | INTRAVENOUS | Status: AC
  Administered 2019-03-19: 22:00:00 4 mg via INTRAVENOUS
  Filled 2019-03-19: qty 1

## 2019-03-19 MED ORDER — CEPHALEXIN 500 MG PO CAPS: 500.0000 mg | ORAL_CAPSULE | Freq: Three times a day (TID) | ORAL | 0 refills | Status: DC

## 2019-03-19 MED ORDER — CEFAZOLIN SODIUM-DEXTROSE 1-4 GM/50ML-% IV SOLN
1.0000 g | Freq: Once | INTRAVENOUS | Status: AC
  Administered 2019-03-19: 1 g via INTRAVENOUS
  Filled 2019-03-19 (×2): qty 50

## 2019-03-19 MED ORDER — OXYCODONE-ACETAMINOPHEN 5-325 MG PO TABS: 1.0000 | ORAL_TABLET | Freq: Four times a day (QID) | ORAL | 0 refills | Status: DC | PRN

## 2019-03-19 MED ORDER — ONDANSETRON HCL 4 MG/2ML IJ SOLN
4.0000 mg | Freq: Once | INTRAMUSCULAR | Status: AC
  Administered 2019-03-19: 20:00:00 4 mg via INTRAVENOUS
  Filled 2019-03-19: qty 2

## 2019-03-19 NOTE — ED Provider Notes (Signed)
Patient presenting with fall on outstretched hand with suspected compound fracture to the left wrist.  Patient did not hit her head or lose consciousness.  Her shoe caught on the sidewalk.  She is not on anticoagulation.  On exam, there are 2 bleeding wounds and visible bone.  There is also tension with probable bone fragment on the ulnar aspect.  Radial pulses intact.  Cap refill less than 2 seconds throughout the hand.  Sensation intact throughout the hand.  Patient also with right elbow tenderness and swelling as well as some left knee tenderness and swelling.  X-rays and labs ordered.  Patient given 200 mcg of fentanyl in route.  Patient was lightheaded and nauseous on arrival.  Zofran given.  IV fluids hung.  Patient also having some oxygen desaturations.  Placed on 2 L nasal cannula.  Tetanus is up-to-date.   MSE was initiated and I personally evaluated the patient and placed orders (if any) at  8:00 PM on March 19, 2019.  The patient appears stable so that the remainder of the MSE may be completed by another provider.   Emi Holes, PA-C 03/19/19 2003    Charlynne Pander, MD 03/26/19 [phone]

## 2019-03-19 NOTE — ED Triage Notes (Signed)
Pt bib ems for mechanical fall, pt tried to catch herself with her hands, obvious deformity noted to her left wrist, pain and abrasion to left knee. Pt given fentanyl en route. VSS. Pt c.o nausea and faint after transferring to chair from ems stretcher.

## 2019-03-19 NOTE — Discharge Instructions (Signed)
Keep splint in place.   Don't eat or drink anything until you see Dr. Merlyn Lot   Take percocet as needed for pain   You need to see Dr. Merlyn Lot around 9 am in the morning. You need to have someone drive you there to discuss surgical options   Return to ER if you have worse wrist pain, uncontrolled bleeding.

## 2019-03-19 NOTE — ED Notes (Signed)
Pt on the phone with her husbnad

## 2019-03-19 NOTE — ED Notes (Signed)
Husband: Jodiann Smouse

## 2019-03-19 NOTE — ED Provider Notes (Signed)
[facility] EMERGENCY DEPARTMENT Provider Note   CSN: [id number] Arrival date & time: 03/19/19  1948    History   Chief Complaint Chief Complaint  Patient presents with  . Fall  . Wrist Injury    HPI Savannah Harris is a 83 y.o. female here presenting with fall on outstretched hand.  She states that her shoe got caught on the sidewalk and she had a mechanical fall onto outstretched hand.  She was noted to have obvious left wrist deformity and EMS gave her 200 mcg of fentanyl.  Patient denies any other injuries and is not on blood thinners. Tdap up to date.      The history is provided by the patient.    Past Medical History:  Diagnosis Date  . Arthritis    WRIST/ HANDS  . Asymptomatic vertebral artery stenosis    RIGHT SIDE  . Diverticulitis 11-30-13   during Christmas holiday- resolved  . GERD (gastroesophageal reflux disease)   . Hyperlipidemia   . Hypertension   . Leg cramps    OCCASIONAL  . Sleep apnea 11-30-13   mild-no cpap.  . Type 2 diabetes mellitus (HCC)   . Urge urinary incontinence     Patient Active Problem List   Diagnosis Date Noted  . Sleep apnea 02/22/2017  . Memory loss 10/23/2014  . Essential hypertension 10/23/2014  . Hyperlipidemia 10/23/2014  . Type 2 diabetes mellitus with complication (HCC) 10/23/2014  . Diastasis recti 03/02/2013  . Constipation, chronic 03/02/2013    Past Surgical History:  Procedure Laterality Date  . CATARACT EXTRACTION W/ INTRAOCULAR LENS  IMPLANT, BILATERAL    . COLONOSCOPY WITH PROPOFOL N/A 12/15/2013   Procedure: COLONOSCOPY WITH PROPOFOL;  Surgeon: Charolett Bumpers, MD;  Location: [facility];  Service: Endoscopy;  Laterality: N/A;  . INTERSTIM IMPLANT PLACEMENT N/A 07/28/2013   Procedure: Leane Platt IMPLANT FIRST STAGE;  Surgeon: Martina Sinner, MD;  Location: [facility];  Service: Urology;  Laterality: N/A;  . INTERSTIM IMPLANT PLACEMENT N/A 07/28/2013   Procedure:  Leane Platt IMPLANT SECOND STAGE;  Surgeon: Martina Sinner, MD;  Location: [facility];  Service: Urology;  Laterality: N/A;  . LEFT WRSIT TENDON REPAIR  1999  . PUBOVAGINAL SLING  08-26-2007   SPARC SLING  . TONSILLECTOMY AND ADENOIDECTOMY  AS CHILD  . TOTAL ABDOMINAL HYSTERECTOMY W/ BILATERAL SALPINGOOPHORECTOMY  1970s   AND APPENDECTOMY  . TOTAL HIP ARTHROPLASTY Right 12-16-2008     OB History   No obstetric history on file.      Home Medications    Prior to Admission medications   Medication Sig Start Date End Date Taking? Authorizing Provider  aspirin EC 81 MG tablet Take 81 mg by mouth at bedtime.    [provider]  CANAGLIFLOZIN PO Take by mouth.    [provider]  cephALEXin (KEFLEX) 500 MG capsule Take 1 capsule (500 mg total) by mouth 3 (three) times daily. 03/19/19   Charlynne Pander, MD  Cholecalciferol (VITAMIN D3) 5000 UNITS CAPS Take 5,000 Units by mouth daily.     [provider]  cycloSPORINE (RESTASIS) 0.05 % ophthalmic emulsion Place 1 drop into both eyes 2 (two) times daily. May do addition dose    [provider]  Dulaglutide (TRULICITY) 1.5 MG/0.5ML SOPN Inject into the skin.    [provider]  glimepiride (AMARYL) 4 MG tablet Take 4 mg by mouth daily with breakfast.    [provider]  KRILL OIL PO Take by mouth.    [provider]  metFORMIN (GLUMETZA) 1000 MG (MOD) 24 hr tablet Take 2 tablets by mouth daily. 06/28/17   [provider]  oxyCODONE-acetaminophen (PERCOCET) 5-325 MG tablet Take 1 tablet by mouth every 6 (six) hours as needed. 03/19/19   Charlynne PanderYao, Juanmanuel Marohl Hsienta, MD  PRAVASTATIN SODIUM PO Take by mouth.    [provider]    Family History Family History  Problem Relation Age of Onset  . Diabetes Mother   . Alzheimer's disease Mother   . Dementia Mother     Social History Social History   Tobacco Use  . Smoking status: Never Smoker  . Smokeless  tobacco: Never Used  Substance Use Topics  . Alcohol use: No    Alcohol/week: 0.0 standard drinks  . Drug use: No     Allergies   Latex; Canagliflozin; Adhesive [tape]; and Codeine   Review of Systems Review of Systems  Musculoskeletal:       L wrist pain   All other systems reviewed and are negative.    Physical Exam Updated Vital Signs BP 132/77 (BP Location: Right Arm)   Pulse 76   Temp 98.1 F (36.7 C) (Oral)   Resp 16   SpO2 95%   Physical Exam Vitals signs and nursing note reviewed.  Constitutional:      Comments: Uncomfortable   HENT:     Head: Normocephalic.     Right Ear: Tympanic membrane normal.     Left Ear: Tympanic membrane normal.     Nose: Nose normal.     Mouth/Throat:     Mouth: Mucous membranes are moist.  Eyes:     Extraocular Movements: Extraocular movements intact.     Pupils: Pupils are equal, round, and reactive to light.  Neck:     Musculoskeletal: Normal range of motion.  Cardiovascular:     Rate and Rhythm: Normal rate and regular rhythm.     Pulses: Normal pulses.     Heart sounds: Normal heart sounds.  Pulmonary:     Effort: Pulmonary effort is normal.     Breath sounds: Normal breath sounds.  Abdominal:     General: Abdomen is flat.     Palpations: Abdomen is soft.  Skin:    General: Skin is warm.     Capillary Refill: Capillary refill takes less than 2 seconds.  Neurological:     General: No focal deficit present.     Mental Status: She is alert and oriented to person, place, and time.  Psychiatric:        Mood and Affect: Mood normal.        Behavior: Behavior normal.      ED Treatments / Results  Labs (all labs ordered are listed, but only abnormal results are displayed) Labs Reviewed  BASIC METABOLIC PANEL - Abnormal; Notable for the following components:      Result Value   Glucose, Bld 197 (*)    BUN 25 (*)    Creatinine, Ser 1.25 (*)    GFR calc non Af Amer 41 (*)    GFR calc Af Amer 47 (*)    All  other components within normal limits  CBC WITH DIFFERENTIAL/PLATELET - Abnormal; Notable for the following components:   Platelets 128 (*)    All other components within normal limits  SARS CORONAVIRUS 2 (HOSPITAL ORDER[facility])    EKG None  Radiology Dg Chest 1 View  Result Date: 03/19/2019 CLINICAL DATA:  Larey Seat. Left wrist fractures. Preoperative evaluation. EXAM: CHEST  1 VIEW COMPARISON:  12/10/2008. FINDINGS: Poor inspiration. Borderline enlarged cardiac silhouette. Minimal bibasilar atelectasis. Stable right basilar calcified granuloma. Diffuse osteopenia. No fracture or pneumothorax seen. IMPRESSION: Poor inspiration with minimal bibasilar atelectasis. Electronically Signed   By: Beckie Salts M.D.   On: 03/19/2019 21:25   Dg Elbow Complete Right  Result Date: 03/19/2019 CLINICAL DATA:  Right elbow pain following a fall. EXAM: RIGHT ELBOW - COMPLETE 3+ VIEW COMPARISON:  None. FINDINGS: There is no evidence of fracture, dislocation, or joint effusion. There is no evidence of arthropathy or other focal bone abnormality. Soft tissues are unremarkable. IMPRESSION: Normal examination. Electronically Signed   By: Beckie Salts M.D.   On: 03/19/2019 21:23   Dg Forearm Left  Result Date: 03/19/2019 CLINICAL DATA:  Pain EXAM: LEFT FOREARM - 2 VIEW COMPARISON:  07/19/2010 FINDINGS: There are acute comminuted displaced and impacted fractures involving the distal radius and ulna. There is extensive surrounding soft tissue swelling. The osseous structures are diffusely osteopenic. The distal radius is displaced laterally by approximately 1.5-2 cm. The trapezium has been excised. IMPRESSION: Acute displaced comminuted fractures of the distal radius and ulna. Electronically Signed   By: Katherine Mantle M.D.   On: 03/19/2019 21:22   Dg Wrist Complete Left  Result Date: 03/19/2019 CLINICAL DATA:  Left wrist pain and deformity following a fall. EXAM: LEFT WRIST - COMPLETE 3+  VIEW COMPARISON:  Left forearm obtained at the same time. FINDINGS: Comminuted fractures of the distal radial metaphysis and distal ulnar metaphysis and diaphysis. Marked radial displacement of the distal fragments with radial angulation of the distal fragments. There is also ventral displacement and angulation of the distal fragments. IMPRESSION: Distal radius and ulna fractures, as described above. Electronically Signed   By: Beckie Salts M.D.   On: 03/19/2019 21:22   Dg Knee Complete 4 Views Left  Result Date: 03/19/2019 CLINICAL DATA:  Left knee pain after fall. EXAM: LEFT KNEE - COMPLETE 4+ VIEW COMPARISON:  None. FINDINGS: No acute fracture or dislocation. No joint effusion. Mild medial compartment joint space narrowing. Osteopenia. Soft tissues are unremarkable. Vascular calcifications. IMPRESSION: 1.  No acute osseous abnormality. 2. Mild medial compartment osteoarthritis. Electronically Signed   By: Obie Dredge M.D.   On: 03/19/2019 21:21    Procedures Reduction of fracture Date/Time: 03/19/2019 11:35 PM Performed by: Charlynne Pander, MD Authorized by: Charlynne Pander, MD  Consent: Verbal consent obtained. Risks and benefits: risks, benefits and alternatives were discussed Consent given by: patient Patient understanding: patient states understanding of the procedure being performed Patient consent: the patient's understanding of the procedure matches consent given Procedure consent: procedure consent matches procedure scheduled Required items: required blood products, implants, devices, and special equipment available Preparation: Patient was prepped and draped in the usual sterile fashion.  Sedation: Patient sedated: no  Patient tolerance: Patient tolerated the procedure well with no immediate complications  .Splint Application Date/Time: 03/19/2019 11:35 PM Performed by: Charlynne Pander, MD Authorized by: Charlynne Pander, MD   Consent:    Consent obtained:   Verbal   Consent given by:  Patient   Risks discussed:  Discoloration, numbness and pain   Alternatives discussed:  No treatment Pre-procedure details:    Sensation:  Normal Procedure details:    Laterality:  Left   Location:  Wrist   Wrist:  L wrist   Splint type:  Sugar tong (sugar tongue)  Post-procedure details:    Pain:  Improved   Sensation:  Normal   Patient tolerance of procedure:  Tolerated well, no immediate complications   (including critical care time)  .  Medications Ordered in ED Medications  ondansetron (ZOFRAN) injection 4 mg (4 mg Intravenous Given 03/19/19 2021)  morphine 4 MG/ML injection 4 mg (4 mg Intravenous Given 03/19/19 2154)  ceFAZolin (ANCEF) IVPB 1 g/50 mL premix (0 g Intravenous Stopped 03/19/19 2230)     Initial Impression / Assessment and Plan / ED Course  I have reviewed the triage vital signs and the nursing notes.  Pertinent labs & imaging results that were available during my care of the patient were reviewed by me and considered in my medical decision making (see chart for details).        Savannah Harris is a 83 y.o. female here presenting with left wrist injury.  Patient has an obvious left wrist open fracture.  Dr. Merlyn Lot was consulted in triage and will look at x-rays and call back. She is neurovascularly intact.  9 pm Xray showed distal radius and ulna fractures. Dr. Merlyn Lot looked at xray and recommend sugar tongue splint and ancef and pain meds. He wants patient to be NPO and he wants someone to drive patient to his office tomorrow for surgery. I was able to reduce the fracture to improve alignment and pain and I placed sugar tongue splint with the tech. Stable for discharge     Final Clinical Impressions(s) / ED Diagnoses   Final diagnoses:  Other type I or II open fracture of distal end of radius, unspecified laterality, initial encounter  Type I or II open fracture of distal end of left ulna, unspecified fracture morphology, initial  encounter    ED Discharge Orders         Ordered    oxyCODONE-acetaminophen (PERCOCET) 5-325 MG tablet  Every 6 hours PRN     03/19/19 2247    cephALEXin (KEFLEX) 500 MG capsule  3 times daily     03/19/19 2247           Charlynne Pander, MD 03/19/19 [phone]

## 2019-03-20 ENCOUNTER — Encounter (HOSPITAL_BASED_OUTPATIENT_CLINIC_OR_DEPARTMENT_OTHER)
Admission: RE | Disposition: A | Discharge status: Home / Self Care | Payer: Self-pay | Source: Ambulatory Visit | Attending: Orthopedic Surgery

## 2019-03-20 ENCOUNTER — Ambulatory Visit (HOSPITAL_BASED_OUTPATIENT_CLINIC_OR_DEPARTMENT_OTHER): Payer: Medicare Other | Admitting: Certified Registered"

## 2019-03-20 ENCOUNTER — Other Ambulatory Visit: Payer: Self-pay

## 2019-03-20 ENCOUNTER — Encounter (HOSPITAL_BASED_OUTPATIENT_CLINIC_OR_DEPARTMENT_OTHER): Payer: Self-pay

## 2019-03-20 ENCOUNTER — Ambulatory Visit (HOSPITAL_BASED_OUTPATIENT_CLINIC_OR_DEPARTMENT_OTHER)
Admission: RE | Admit: 2019-03-20 | Discharge: 2019-03-20 | Disposition: A | Discharge status: Home / Self Care | Payer: Medicare Other | Source: Ambulatory Visit | Attending: Orthopedic Surgery | Admitting: Orthopedic Surgery

## 2019-03-20 ENCOUNTER — Other Ambulatory Visit: Payer: Self-pay | Admitting: Orthopedic Surgery

## 2019-03-20 DIAGNOSIS — Z7982 Long term (current) use of aspirin: Secondary | ICD-10-CM | POA: Diagnosis not present

## 2019-03-20 DIAGNOSIS — M1990 Unspecified osteoarthritis, unspecified site: Secondary | ICD-10-CM | POA: Insufficient documentation

## 2019-03-20 DIAGNOSIS — I10 Essential (primary) hypertension: Secondary | ICD-10-CM | POA: Insufficient documentation

## 2019-03-20 DIAGNOSIS — E785 Hyperlipidemia, unspecified: Secondary | ICD-10-CM | POA: Diagnosis not present

## 2019-03-20 DIAGNOSIS — Z7984 Long term (current) use of oral hypoglycemic drugs: Secondary | ICD-10-CM | POA: Diagnosis not present

## 2019-03-20 DIAGNOSIS — S52602B Unspecified fracture of lower end of left ulna, initial encounter for open fracture type I or II: Secondary | ICD-10-CM | POA: Insufficient documentation

## 2019-03-20 DIAGNOSIS — S52572B Other intraarticular fracture of lower end of left radius, initial encounter for open fracture type I or II: Secondary | ICD-10-CM | POA: Insufficient documentation

## 2019-03-20 DIAGNOSIS — E119 Type 2 diabetes mellitus without complications: Secondary | ICD-10-CM | POA: Insufficient documentation

## 2019-03-20 DIAGNOSIS — W19XXXA Unspecified fall, initial encounter: Secondary | ICD-10-CM | POA: Diagnosis not present

## 2019-03-20 DIAGNOSIS — M8580 Other specified disorders of bone density and structure, unspecified site: Secondary | ICD-10-CM | POA: Insufficient documentation

## 2019-03-20 DIAGNOSIS — Z79899 Other long term (current) drug therapy: Secondary | ICD-10-CM | POA: Diagnosis not present

## 2019-03-20 DIAGNOSIS — K219 Gastro-esophageal reflux disease without esophagitis: Secondary | ICD-10-CM | POA: Insufficient documentation

## 2019-03-20 HISTORY — PX: OPEN REDUCTION INTERNAL FIXATION (ORIF) DISTAL RADIAL FRACTURE: SHX5989

## 2019-03-20 LAB — GLUCOSE, CAPILLARY
Glucose-Capillary: 114 mg/dL — ABNORMAL HIGH (ref 70–99)
Glucose-Capillary: 133 mg/dL — ABNORMAL HIGH (ref 70–99)

## 2019-03-20 SURGERY — OPEN REDUCTION INTERNAL FIXATION (ORIF) DISTAL RADIUS FRACTURE
Anesthesia: Monitor Anesthesia Care | Site: Wrist | Laterality: Right

## 2019-03-20 MED ORDER — LACTATED RINGERS IV SOLN
INTRAVENOUS | Status: DC
  Administered 2019-03-20: 12:00:00 via INTRAVENOUS

## 2019-03-20 MED ORDER — ACETAMINOPHEN 160 MG/5ML PO SOLN: 1000.0000 mg | Freq: Once | ORAL | Status: DC | PRN

## 2019-03-20 MED ORDER — HYDROCODONE-ACETAMINOPHEN 5-325 MG PO TABS: ORAL_TABLET | ORAL | 0 refills | Status: DC

## 2019-03-20 MED ORDER — OXYCODONE HCL 5 MG PO TABS: 5.0000 mg | ORAL_TABLET | Freq: Once | ORAL | Status: DC | PRN

## 2019-03-20 MED ORDER — OXYCODONE HCL 5 MG/5ML PO SOLN: 5.0000 mg | Freq: Once | ORAL | Status: DC | PRN

## 2019-03-20 MED ORDER — FENTANYL CITRATE (PF) 100 MCG/2ML IJ SOLN
INTRAMUSCULAR | Status: AC
  Filled 2019-03-20: qty 2

## 2019-03-20 MED ORDER — FENTANYL CITRATE (PF) 100 MCG/2ML IJ SOLN
50.0000 ug | INTRAMUSCULAR | Status: DC | PRN
  Administered 2019-03-20: 100 ug via INTRAVENOUS

## 2019-03-20 MED ORDER — PROPOFOL 10 MG/ML IV BOLUS
INTRAVENOUS | Status: DC | PRN
  Administered 2019-03-20 (×2): 10 mg via INTRAVENOUS

## 2019-03-20 MED ORDER — CLONIDINE HCL (ANALGESIA) 100 MCG/ML EP SOLN
EPIDURAL | Status: DC | PRN
  Administered 2019-03-20: 100 ug

## 2019-03-20 MED ORDER — CEFAZOLIN SODIUM-DEXTROSE 2-4 GM/100ML-% IV SOLN
2.0000 g | INTRAVENOUS | Status: AC
  Administered 2019-03-20: 2 g via INTRAVENOUS

## 2019-03-20 MED ORDER — PROPOFOL 500 MG/50ML IV EMUL
INTRAVENOUS | Status: DC | PRN
  Administered 2019-03-20: 50 ug/kg/min via INTRAVENOUS

## 2019-03-20 MED ORDER — SCOPOLAMINE 1 MG/3DAYS TD PT72: 1.0000 | MEDICATED_PATCH | Freq: Once | TRANSDERMAL | Status: DC | PRN

## 2019-03-20 MED ORDER — ACETAMINOPHEN 500 MG PO TABS: 1000.0000 mg | ORAL_TABLET | Freq: Once | ORAL | Status: DC | PRN

## 2019-03-20 MED ORDER — LIDOCAINE-EPINEPHRINE (PF) 1.5 %-1:200000 IJ SOLN
INTRAMUSCULAR | Status: DC | PRN
  Administered 2019-03-20: 10 mL via PERINEURAL

## 2019-03-20 MED ORDER — CEFAZOLIN SODIUM-DEXTROSE 2-4 GM/100ML-% IV SOLN
INTRAVENOUS | Status: AC
  Filled 2019-03-20: qty 100

## 2019-03-20 MED ORDER — SULFAMETHOXAZOLE-TRIMETHOPRIM 800-160 MG PO TABS: 1.0000 | ORAL_TABLET | Freq: Two times a day (BID) | ORAL | 0 refills | Status: DC

## 2019-03-20 MED ORDER — MIDAZOLAM HCL 2 MG/2ML IJ SOLN
INTRAMUSCULAR | Status: AC
  Filled 2019-03-20: qty 2

## 2019-03-20 MED ORDER — MIDAZOLAM HCL 2 MG/2ML IJ SOLN: 1.0000 mg | INTRAMUSCULAR | Status: DC | PRN

## 2019-03-20 MED ORDER — FENTANYL CITRATE (PF) 100 MCG/2ML IJ SOLN: 25.0000 ug | INTRAMUSCULAR | Status: DC | PRN

## 2019-03-20 MED ORDER — CHLORHEXIDINE GLUCONATE 4 % EX LIQD: 60.0000 mL | Freq: Once | CUTANEOUS | Status: DC

## 2019-03-20 MED ORDER — BUPIVACAINE HCL (PF) 0.5 % IJ SOLN
INTRAMUSCULAR | Status: DC | PRN
  Administered 2019-03-20: 30 mL via PERINEURAL

## 2019-03-20 MED ORDER — ONDANSETRON HCL 4 MG/2ML IJ SOLN
INTRAMUSCULAR | Status: DC | PRN
  Administered 2019-03-20: 4 mg via INTRAVENOUS

## 2019-03-20 MED ORDER — ACETAMINOPHEN 10 MG/ML IV SOLN: 1000.0000 mg | Freq: Once | INTRAVENOUS | Status: DC | PRN

## 2019-03-20 MED ORDER — PHENYLEPHRINE HCL (PRESSORS) 10 MG/ML IV SOLN
INTRAVENOUS | Status: DC | PRN
  Administered 2019-03-20: 40 ug via INTRAVENOUS

## 2019-03-20 SURGICAL SUPPLY — 66 items
BANDAGE ACE 3X5.8 VEL STRL LF (GAUZE/BANDAGES/DRESSINGS) ×3 IMPLANT
BIT DRILL 2.0 LNG QUCK RELEASE (BIT) IMPLANT
BIT DRILL 2.8X5 QR DISP (BIT) ×2 IMPLANT
BLADE SURG 15 STRL LF DISP TIS (BLADE) ×2 IMPLANT
BLADE SURG 15 STRL SS (BLADE) ×4
BNDG ESMARK 4X9 LF (GAUZE/BANDAGES/DRESSINGS) ×3 IMPLANT
BNDG GAUZE ELAST 4 BULKY (GAUZE/BANDAGES/DRESSINGS) ×3 IMPLANT
BNDG PLASTER X FAST 3X3 WHT LF (CAST SUPPLIES) ×70 IMPLANT
BONE CHIP PRESERV 5CC PCAN5 (Bone Implant) ×3 IMPLANT
CHLORAPREP W/TINT 26 (MISCELLANEOUS) ×3 IMPLANT
CORD BIPOLAR FORCEPS 12FT (ELECTRODE) ×3 IMPLANT
COVER BACK TABLE REUSABLE LG (DRAPES) ×3 IMPLANT
COVER MAYO STAND REUSABLE (DRAPES) ×3 IMPLANT
COVER WAND RF STERILE (DRAPES) IMPLANT
CUFF TOURN SGL QUICK 18X4 (TOURNIQUET CUFF) ×2 IMPLANT
CUFF TOURN SGL QUICK 24 (TOURNIQUET CUFF)
CUFF TRNQT CYL 24X4X16.5-23 (TOURNIQUET CUFF) IMPLANT
DRAPE EXTREMITY T 121X128X90 (DISPOSABLE) ×3 IMPLANT
DRAPE OEC MINIVIEW 54X84 (DRAPES) ×3 IMPLANT
DRAPE SURG 17X23 STRL (DRAPES) ×3 IMPLANT
DRILL 2.0 LNG QUICK RELEASE (BIT) ×3
GAUZE SPONGE 4X4 12PLY STRL (GAUZE/BANDAGES/DRESSINGS) ×3 IMPLANT
GAUZE XEROFORM 1X8 LF (GAUZE/BANDAGES/DRESSINGS) ×3 IMPLANT
GLOVE BIO SURGEON STRL SZ7.5 (GLOVE) ×1 IMPLANT
GLOVE BIOGEL PI IND STRL 7.0 (GLOVE) IMPLANT
GLOVE BIOGEL PI IND STRL 8 (GLOVE) ×1 IMPLANT
GLOVE BIOGEL PI IND STRL 8.5 (GLOVE) IMPLANT
GLOVE BIOGEL PI INDICATOR 7.0 (GLOVE) ×2
GLOVE BIOGEL PI INDICATOR 8 (GLOVE) ×2
GLOVE BIOGEL PI INDICATOR 8.5 (GLOVE) ×2
GLOVE SURG ORTHO 8.0 STRL STRW (GLOVE) IMPLANT
GLOVE SURG SS PI 6.5 STRL IVOR (GLOVE) ×2 IMPLANT
GLOVE SURG SS PI 7.5 STRL IVOR (GLOVE) ×2 IMPLANT
GLOVE SURG SS PI 8.0 STRL IVOR (GLOVE) ×2 IMPLANT
GOWN STRL REUS W/ TWL LRG LVL3 (GOWN DISPOSABLE) ×1 IMPLANT
GOWN STRL REUS W/TWL LRG LVL3 (GOWN DISPOSABLE) ×2
GOWN STRL REUS W/TWL XL LVL3 (GOWN DISPOSABLE) ×5 IMPLANT
GRAFT BNE CANC CHIPS 1-8 5CC (Bone Implant) IMPLANT
GUIDEWIRE ORTHO 0.054X6 (WIRE) ×6 IMPLANT
NDL HYPO 25X1 1.5 SAFETY (NEEDLE) IMPLANT
NEEDLE HYPO 25X1 1.5 SAFETY (NEEDLE) IMPLANT
NS IRRIG 1000ML POUR BTL (IV SOLUTION) ×3 IMPLANT
PACK BASIN DAY SURGERY FS (CUSTOM PROCEDURE TRAY) ×3 IMPLANT
PAD CAST 3X4 CTTN HI CHSV (CAST SUPPLIES) ×1 IMPLANT
PADDING CAST COTTON 3X4 STRL (CAST SUPPLIES) ×2
PLATE PROX NARROW LEFT (Plate) ×2 IMPLANT
SCREW BN FT 16X2.3XLCK HEX CRT (Screw) IMPLANT
SCREW CORT FT 18X2.3XLCK HEX (Screw) IMPLANT
SCREW CORTICAL LOCKING 2.3X16M (Screw) ×5 IMPLANT
SCREW CORTICAL LOCKING 2.3X18M (Screw) ×4 IMPLANT
SCREW CORTICAL LOCKING 2.3X20M (Screw) ×4 IMPLANT
SCREW FX16X2.3XLCK SMTH NS CRT (Screw) IMPLANT
SCREW FX18X2.3XSMTH LCK NS CRT (Screw) IMPLANT
SCREW FX20X2.3XSMTH LCK NS CRT (Screw) IMPLANT
SCREW HEXALOBE NON-LOCK 3.5X14 (Screw) ×2 IMPLANT
SCREW NON TOGG 2.3X20MM (Screw) ×2 IMPLANT
SCREW NONLOCK HEX 3.5X12 (Screw) ×4 IMPLANT
SLEEVE SCD COMPRESS KNEE MED (MISCELLANEOUS) IMPLANT
SLING ARM FOAM STRAP MED (SOFTGOODS) ×2 IMPLANT
STOCKINETTE 4X48 STRL (DRAPES) ×3 IMPLANT
SUT ETHILON 4 0 PS 2 18 (SUTURE) ×3 IMPLANT
SUT VICRYL 4-0 PS2 18IN ABS (SUTURE) ×3 IMPLANT
SYR BULB 3OZ (MISCELLANEOUS) ×3 IMPLANT
SYR CONTROL 10ML LL (SYRINGE) IMPLANT
TOWEL GREEN STERILE FF (TOWEL DISPOSABLE) ×6 IMPLANT
UNDERPAD 30X30 (UNDERPADS AND DIAPERS) ×3 IMPLANT

## 2019-03-20 NOTE — Op Note (Signed)
03/20/2019 [facility]  Operative Note  Pre Op Diagnosis: Left comminuted intraarticular grade 1 open distal radius fracture, left comminuted distal ulna fracture  Post Op Diagnosis: Left comminuted intraarticular grade 1 open distal radius fracture, left comminuted distal ulna fracture  Procedure:  1. Irrigation debridement of left grade 1 open distal radius fracture including removal of bone 2. ORIF left comminuted intraarticular distal radius fracture, greater than 3 intra-articular fragments 3. Left brachioradialis release 4. Closed reduction pin fixation left distal ulna fracture  Surgeon: Betha LoaKevin Kaylia Winborne, MD  Assistant: Cindee SaltGary Billy Turvey, MD  Anesthesia: Regional with sedation  Fluids: Per anesthesia flow sheet  EBL: minimal  Complications: None  Specimen: None  Tourniquet Time:  Total Tourniquet Time Documented: Upper Arm (Right) - 89 minutes Total: Upper Arm (Right) - 89 minutes   Disposition: Stable to PACU  INDICATIONS:  Savannah Harris is a 83 y.o. female states she fell yesterday evening from a standing height injuring her left wrist.  She was seen at the emergency department where radiographs were taken revealing distal radius and ulna fractures.  She was noted to have a focal dorsally with slow bleeding.  She was given antibiotics and splinted and followed up in the office. We discussed nonoperative and operative treatment options.  She wished to proceed with operative fixation.  Risks, benefits, and alternatives of surgery were discussed including the risk of blood loss; infection; damage to nerves, vessels, tendons, ligaments, bone; failure of surgery; need for additional surgery; complications with wound healing; continued pain; nonunion; malunion; stiffness.  We also discussed the possible need for bone graft and the benefits and risks including the possibility of disease transmission.  She voiced understanding of these risks and elected to proceed.     OPERATIVE COURSE:  After being identified preoperatively by myself, the patient and I agreed upon the procedure and site of procedure.  Surgical site was marked.  The risks, benefits and alternatives of the surgery were reviewed and she wished to proceed.  Surgical consent had been signed.  She was given IV Ancef as preoperative antibiotic prophylaxis.  She was transferred to the operating room and placed on the operating room table in supine position with the Right upper extremity on an armboard. Sedation was induced by the anesthesiologist.  A regional block had been performed by anesthesia in preoperative holding.  The Right upper extremity was prepped and draped in normal sterile orthopedic fashion.  A surgical pause was performed between the surgeons, anesthesia and operating room staff, and all were in agreement as to the patient, procedure and site of procedure.  Tourniquet at the proximal aspect of the extremity was inflated to 250 mmHg after exsanguination of the limb with an Esmarch bandage.  The wound on the dorsum of the wrist was extended both proximally distally.  There was bone exposed in the wound.  There was some soft tissue attachment.  A knife was used to release the soft tissue attachments to remove the contaminated bone.  This appeared to be fragments from the dorsal aspect of the distal radius fracture.  The wound was copiously irrigated with sterile saline.  There is no gross contamination.  Standard volar Sherilyn CooterHenry approach was then used.  The bipolar electrocautery was used to obtain hemostasis.  The superficial and deep portions of the FCR tendon sheath were incised, and the FCR and FPL were swept ulnarly to protect the palmar cutaneous branch of the median nerve.  The brachioradialis was released at the radial  side of the radius.  The pronator quadratus was released and elevated with the periosteal elevator.  The fracture site was identified and cleared of soft tissue interposition and  hematoma.  There was significant comminution to the fracture in the metaphysis.  There was intra-articular extension.  This created at least 3 intra-articular fragments including dorsal fragmentation.  It was felt that bone graft would be appropriate.  Cancellus bone chips were then packed into the fracture site to aid in reduction and maintenance of position.  The fracture was reduced.  An [employer] volar distal radial locking plate was selected.  It was secured to the bone with the guidepins.  C-arm was used in AP and lateral projections to ensure appropriate reduction and position of the hardware and adjustments made as necessary.  Standard AO drilling and measuring technique was used.  A single screw was placed in the slotted hole in the shaft of the plate.  The remaining 2 holes in the shaft of the plate were then filled with nonlocking screws.  Acceptable purchase was obtained.  The distal holes were filled with locking pegs with the exception of the styloid holes, which were filled with locking screws. C-arm was used in AP, lateral and oblique projections to ensure appropriate reduction and position of hardware, which was the case.  There was no intra-articular penetration of hardware.  The distal ulna fracture was segmental.  The fractures were able to be reduced acceptably.  Two 0.54 inch K wires were then placed from distally at the ulna across the fracture sites and into the ulna proximal to the fractures.  2 pins were used.  Good reduction and fixation was obtained.  C-arm was again used in AP lateral and oblique projections to ensure appropriate reduction position of hardware which was the case.  There was a butterfly fragment to the radius at the ulnar side.  This was reduced with a clamp and then a 2.3 millimeter screw placed in a lag fashion to capture this fragment.  The arm was again used to confirm appropriate reduction and position of hardware which was the case.  The wound was copiously irrigated  with sterile saline.  The pronator was not able to be brought back over the plate.  Vicryl suture was placed in the subcutaneous tissues in an inverted interrupted fashion and the skin was closed with 4-0 nylon in a horizontal mattress fashion.  The pins were bent and cut short.  There was good pronation and supination of the wrist without crepitance.  The wound and pin sites were then dressed with sterile Xeroform, 4x4s, and wrapped with a Kerlix bandage.  A sugar tong splint was placed and wrapped with Kerlix and Ace bandage.  Tourniquet was deflated at 89 minutes.  Fingertips were pink with brisk capillary refill after deflation of the tourniquet.  Operative drapes were broken down.  The patient was awoken from anesthesia safely.  She was transferred back to the stretcher and taken to the PACU in stable condition.  I will see her back in the office in one week for postoperative followup.  I will give her a prescription for Norco 5/325 1-2 tabs PO q6 hours prn pain, dispense # 30 and Bactrim DS 1 p.o. twice daily x7 days.    Betha Loa, MD Electronically signed, 03/20/19

## 2019-03-20 NOTE — Anesthesia Preprocedure Evaluation (Addendum)
Anesthesia Evaluation  Patient identified by MRN, date of birth, ID band Patient awake    Reviewed: Allergy & Precautions, NPO status , Patient's Chart, lab work & pertinent test results  History of Anesthesia Complications Negative for: history of anesthetic complications  Airway Mallampati: II  TM Distance: >3 FB Neck ROM: Full    Dental  (+) Dental Advisory Given   Pulmonary neg shortness of breath, sleep apnea , neg recent URI,    breath sounds clear to auscultation       Cardiovascular hypertension,  Rhythm:Regular     Neuro/Psych  Neuromuscular disease negative psych ROS   GI/Hepatic Neg liver ROS, GERD  Controlled,  Endo/Other  diabetes, Type 2, Oral Hypoglycemic AgentsRIGHT DISTAL AND ULNA FRACTURE  Renal/GU negative Renal ROS     Musculoskeletal  (+) Arthritis ,   Abdominal   Peds  Hematology negative hematology ROS (+)   Anesthesia Other Findings   Reproductive/Obstetrics                             Anesthesia Physical Anesthesia Plan  ASA: II  Anesthesia Plan: MAC and Regional   Post-op Pain Management:    Induction: Intravenous  PONV Risk Score and Plan: 2 and Treatment may vary due to age or medical condition and Propofol infusion  Airway Management Planned: Nasal Cannula  Additional Equipment: None  Intra-op Plan:   Post-operative Plan:   Informed Consent: I have reviewed the patients History and Physical, chart, labs and discussed the procedure including the risks, benefits and alternatives for the proposed anesthesia with the patient or authorized representative who has indicated his/her understanding and acceptance.     Dental advisory given  Plan Discussed with: CRNA and Surgeon  Anesthesia Plan Comments:         Anesthesia Quick Evaluation

## 2019-03-20 NOTE — Op Note (Signed)
I assisted Surgeon(s) and Role:    * Betha Loa, MD - Primary    Cindee Salt, MD - Assisting on the Procedure(s): OPEN REDUCTION INTERNAL FIXATION (ORIF) DISTAL RADIAL FRACTURE on 03/20/2019.  I provided assistance on this case as follows: set up, debridement,incisions,sredution,bone grafting,stabilization, fixation of the radius with plate and screws,pinning of the ulna fracture, closure of the wounds and application of the dressings and splint. Electronically signed by: Cindee Salt, MD Date: 03/20/2019 Time: 4:27 PM

## 2019-03-20 NOTE — H&P (Signed)
Savannah Harris is an 83 y.o. female.   Chief Complaint: left wrist fracture HPI: 83 yo female states she fell from standing height yesterday injuring left wrist.  Seen in ED where XR revealed distal radius and ulna fractures.  Splinted and followed up in office.  She wishes to proceed with operative fixation.  Allergies:  Allergies  Allergen Reactions  . Latex Other (See Comments)    BLISTERS  . Canagliflozin     Pt states Invokana causes dizziness  . Adhesive [Tape] Other (See Comments)    BLISTERS  . Codeine Nausea And Vomiting    Past Medical History:  Diagnosis Date  . Arthritis    WRIST/ HANDS  . Asymptomatic vertebral artery stenosis    RIGHT SIDE  . Diverticulitis 11-30-13   during Christmas holiday- resolved  . GERD (gastroesophageal reflux disease)   . Hyperlipidemia   . Hypertension    no meds  . Leg cramps    OCCASIONAL  . Sleep apnea 11-30-13   mild-no cpap.  . Type 2 diabetes mellitus (HCC)   . Urge urinary incontinence     Past Surgical History:  Procedure Laterality Date  . CATARACT EXTRACTION W/ INTRAOCULAR LENS  IMPLANT, BILATERAL    . COLONOSCOPY WITH PROPOFOL N/A 12/15/2013   Procedure: COLONOSCOPY WITH PROPOFOL;  Surgeon: Charolett Bumpers, MD;  Location: [facility];  Service: Endoscopy;  Laterality: N/A;  . INTERSTIM IMPLANT PLACEMENT N/A 07/28/2013   Procedure: Leane Platt IMPLANT FIRST STAGE;  Surgeon: Martina Sinner, MD;  Location: [facility];  Service: Urology;  Laterality: N/A;  . INTERSTIM IMPLANT PLACEMENT N/A 07/28/2013   Procedure: Leane Platt IMPLANT SECOND STAGE;  Surgeon: Martina Sinner, MD;  Location: [facility] [place];  Service: Urology;  Laterality: N/A;  . LEFT WRSIT TENDON REPAIR  1999  . PUBOVAGINAL SLING  08-26-2007   SPARC SLING  . TONSILLECTOMY AND ADENOIDECTOMY  AS CHILD  . TOTAL ABDOMINAL HYSTERECTOMY W/ BILATERAL SALPINGOOPHORECTOMY  1970s   AND APPENDECTOMY  . TOTAL HIP ARTHROPLASTY Right  12-16-2008    Family History: Family History  Problem Relation Age of Onset  . Diabetes Mother   . Alzheimer's disease Mother   . Dementia Mother     Social History:   reports that she has never smoked. She has never used smokeless tobacco. She reports that she does not drink alcohol or use drugs.  Medications: Medications Prior to Admission  Medication Sig Dispense Refill  . aspirin EC 81 MG tablet Take 81 mg by mouth at bedtime.    Marland Kitchen CANAGLIFLOZIN PO Take by mouth.    . Cholecalciferol (VITAMIN D3) 5000 UNITS CAPS Take 5,000 Units by mouth daily.     . cycloSPORINE (RESTASIS) 0.05 % ophthalmic emulsion Place 1 drop into both eyes 2 (two) times daily. May do addition dose    . Dulaglutide (TRULICITY) 1.5 MG/0.5ML SOPN Inject into the skin.    Marland Kitchen glimepiride (AMARYL) 4 MG tablet Take 4 mg by mouth daily with breakfast.    . metFORMIN (GLUMETZA) 1000 MG (MOD) 24 hr tablet Take 2 tablets by mouth daily.    . Omega-3 Fatty Acids (FISH OIL) 1000 MG CAPS Take 1 capsule by mouth daily.    Marland Kitchen PRAVASTATIN SODIUM PO Take by mouth.    . cephALEXin (KEFLEX) 500 MG capsule Take 1 capsule (500 mg total) by mouth 3 (three) times daily. 20 capsule 0  . oxyCODONE-acetaminophen (PERCOCET) 5-325 MG tablet Take 1 tablet by mouth every  6 (six) hours as needed. 10 tablet 0    Results for orders placed or performed during the hospital encounter of 03/20/19 (from the past 48 hour(s))  Glucose, capillary     Status: Abnormal   Collection Time: 03/20/19 11:49 AM  Result Value Ref Range   Glucose-Capillary 114 (H) 70 - 99 mg/dL    Dg Chest 1 View  Result Date: 03/19/2019 CLINICAL DATA:  Larey Seat. Left wrist fractures. Preoperative evaluation. EXAM: CHEST  1 VIEW COMPARISON:  12/10/2008. FINDINGS: Poor inspiration. Borderline enlarged cardiac silhouette. Minimal bibasilar atelectasis. Stable right basilar calcified granuloma. Diffuse osteopenia. No fracture or pneumothorax seen. IMPRESSION: Poor inspiration  with minimal bibasilar atelectasis. Electronically Signed   By: Beckie Salts M.D.   On: 03/19/2019 21:25   Dg Elbow Complete Right  Result Date: 03/19/2019 CLINICAL DATA:  Right elbow pain following a fall. EXAM: RIGHT ELBOW - COMPLETE 3+ VIEW COMPARISON:  None. FINDINGS: There is no evidence of fracture, dislocation, or joint effusion. There is no evidence of arthropathy or other focal bone abnormality. Soft tissues are unremarkable. IMPRESSION: Normal examination. Electronically Signed   By: Beckie Salts M.D.   On: 03/19/2019 21:23   Dg Forearm Left  Result Date: 03/19/2019 CLINICAL DATA:  Pain EXAM: LEFT FOREARM - 2 VIEW COMPARISON:  07/19/2010 FINDINGS: There are acute comminuted displaced and impacted fractures involving the distal radius and ulna. There is extensive surrounding soft tissue swelling. The osseous structures are diffusely osteopenic. The distal radius is displaced laterally by approximately 1.5-2 cm. The trapezium has been excised. IMPRESSION: Acute displaced comminuted fractures of the distal radius and ulna. Electronically Signed   By: Katherine Mantle M.D.   On: 03/19/2019 21:22   Dg Wrist Complete Left  Result Date: 03/19/2019 CLINICAL DATA:  Left wrist pain and deformity following a fall. EXAM: LEFT WRIST - COMPLETE 3+ VIEW COMPARISON:  Left forearm obtained at the same time. FINDINGS: Comminuted fractures of the distal radial metaphysis and distal ulnar metaphysis and diaphysis. Marked radial displacement of the distal fragments with radial angulation of the distal fragments. There is also ventral displacement and angulation of the distal fragments. IMPRESSION: Distal radius and ulna fractures, as described above. Electronically Signed   By: Beckie Salts M.D.   On: 03/19/2019 21:22   Dg Knee Complete 4 Views Left  Result Date: 03/19/2019 CLINICAL DATA:  Left knee pain after fall. EXAM: LEFT KNEE - COMPLETE 4+ VIEW COMPARISON:  None. FINDINGS: No acute fracture or dislocation.  No joint effusion. Mild medial compartment joint space narrowing. Osteopenia. Soft tissues are unremarkable. Vascular calcifications. IMPRESSION: 1.  No acute osseous abnormality. 2. Mild medial compartment osteoarthritis. Electronically Signed   By: Obie Dredge M.D.   On: 03/19/2019 21:21     A comprehensive review of systems was negative except for: Gastrointestinal: positive for constipation  Blood pressure (!) 141/74, pulse 74, temperature 98.2 F (36.8 C), temperature source Temporal, resp. rate 16, height 5' (1.524 m), weight 62.1 kg, SpO2 95 %.  General appearance: alert, cooperative and appears stated age Head: Normocephalic, without obvious abnormality, atraumatic Neck: supple, symmetrical, trachea midline Cardio: regular rate and rhythm Resp: clear to auscultation bilaterally Extremities: Intact sensation and capillary refill all digits.  +epl/fpl/io.  Two poke wounds at ulnar side of distal forearm Pulses: 2+ and symmetric Skin: Skin color, texture, turgor normal. No rashes or lesions Neurologic: Grossly normal Incision/Wound: as above  Assessment/Plan Left distal radius and ulna fractures.  Non operative and operative treatment options  have been discussed with the patient and patient wishes to proceed with operative treatment. Risks, benefits, and alternatives of surgery have been discussed and the patient agrees with the plan of care.   Betha LoaKevin Ashe Gago 03/20/2019, 12:51 PM

## 2019-03-20 NOTE — Discharge Instructions (Addendum)
° °  ° ° ° °[facility] Center Instructions °Hand Surgery ° °Wound Care: °Keep your hand elevated above the level of your heart.  Do not allow it to dangle by your side.  Keep the dressing dry and do not remove it unless your doctor advises you to do so.  He will usually change it at the time of your post-op visit.  Moving your fingers is advised to stimulate circulation but will depend on the site of your surgery.  If you have a splint applied, your doctor will advise you regarding movement. ° °Activity: °Do not drive or operate machinery today.  Rest today and then you may return to your normal activity and work as indicated by your physician. ° °Diet:  °Drink liquids today or eat a light diet.  You may resume a regular diet tomorrow.   ° °General expectations: °Pain for two to three days. °Fingers may become slightly swollen. ° °Call your doctor if any of the following occur: °Severe pain not relieved by pain medication. °Elevated temperature. °Dressing soaked with blood. °Inability to move fingers. °White or bluish color to fingers. ° ° °Regional Anesthesia Blocks ° °1. Numbness or the inability to move the "blocked" extremity may last from 3-48 hours after placement. The length of time depends on the medication injected and your individual response to the medication. If the numbness is not going away after 48 hours, call your surgeon. ° °2. The extremity that is blocked will need to be protected until the numbness is gone and the  Strength has returned. Because you cannot feel it, you will need to take extra care to avoid injury. Because it may be weak, you may have difficulty moving it or using it. You may not know what position it is in without looking at it while the block is in effect. ° °3. For blocks in the legs and feet, returning to weight bearing and walking needs to be done carefully. You will need to wait until the numbness is entirely gone and the strength has returned. You should be able to move your leg  and foot normally before you try and bear weight or walk. You will need someone to be with you when you first try to ensure you do not fall and possibly risk injury. ° °4. Bruising and tenderness at the needle site are common side effects and will resolve in a few days. ° °5. Persistent numbness or new problems with movement should be communicated to the surgeon or the [facility] ([phone])/ [facility] ([phone]). ° ° ° °Post Anesthesia Home Care Instructions ° °Activity: °Get plenty of rest for the remainder of the day. A responsible individual must stay with you for 24 hours following the procedure.  °For the next 24 hours, DO NOT: °-Drive a car °-Operate machinery °-Drink alcoholic beverages °-Take any medication unless instructed by your physician °-Make any legal decisions or sign important papers. ° °Meals: °Start with liquid foods such as gelatin or soup. Progress to regular foods as tolerated. Avoid greasy, spicy, heavy foods. If nausea and/or vomiting occur, drink only clear liquids until the nausea and/or vomiting subsides. Call your physician if vomiting continues. ° °Special Instructions/Symptoms: °Your throat may feel dry or sore from the anesthesia or the breathing tube placed in your throat during surgery. If this causes discomfort, gargle with warm salt water. The discomfort should disappear within 24 hours. ° °If you had a scopolamine patch placed behind your ear for   the management of post- operative nausea and/or vomiting: ° °1. The medication in the patch is effective for 72 hours, after which it should be removed.  Wrap patch in a tissue and discard in the trash. Wash hands thoroughly with soap and water. °2. You may remove the patch earlier than 72 hours if you experience unpleasant side effects which may include dry mouth, dizziness or visual disturbances. °3. Avoid touching the patch. Wash your hands with soap and water after contact with the patch. °  ° ° °

## 2019-03-20 NOTE — Anesthesia Procedure Notes (Signed)
Anesthesia Regional Block: Axillary brachial plexus block   Pre-Anesthetic Checklist: ,, timeout performed, Correct Patient, Correct Site, Correct Laterality, Correct Procedure, Correct Position, site marked, Risks and benefits discussed,  Surgical consent,  Pre-op evaluation,  At surgeon's request and post-op pain management  Laterality: Left and Upper  Prep: chloraprep       Needles:  Injection technique: Single-shot     Needle Length: 9cm  Needle Gauge: 22     Additional Needles: Arrow StimuQuik ECHO Echogenic Stimulating PNB Needle  Procedures:,,,, ultrasound used (permanent image in chart),,,,  Narrative:  Start time: 03/20/2019 1:48 PM End time: 03/20/2019 1:58 PM Injection made incrementally with aspirations every 5 mL.  Performed by: Personally  Anesthesiologist: Val Eagle, MD

## 2019-03-20 NOTE — Progress Notes (Signed)
Assisted Dr. Moser with left, ultrasound guided, axillary block. Side rails up, monitors on throughout procedure. See vital signs in flow sheet. Tolerated Procedure well. ° °

## 2019-03-20 NOTE — Transfer of Care (Signed)
Immediate Anesthesia Transfer of Care Note  Patient: Savannah Harris  Procedure(s) Performed: OPEN REDUCTION INTERNAL FIXATION (ORIF) DISTAL RADIAL FRACTURE (Right Wrist)  Patient Location: PACU  Anesthesia Type:MAC combined with regional for post-op pain  Level of Consciousness: awake, alert  and oriented  Airway & Oxygen Therapy: Patient Spontanous Breathing and Patient connected to nasal cannula oxygen  Post-op Assessment: Report given to RN and Post -op Vital signs reviewed and stable  Post vital signs: Reviewed and stable  Last Vitals:  Vitals Value Taken Time  BP 114/57 03/20/2019  4:30 PM  Temp    Pulse 73 03/20/2019  4:30 PM  Resp 12 03/20/2019  4:30 PM  SpO2 98 % 03/20/2019  4:30 PM  Vitals shown include unvalidated device data.  Last Pain:  Vitals:   03/20/19 1134  TempSrc: Temporal  PainSc: 5          Complications: No apparent anesthesia complications

## 2019-03-21 NOTE — Anesthesia Postprocedure Evaluation (Signed)
Anesthesia Post Note  Patient: Savannah Harris  Procedure(s) Performed: OPEN REDUCTION INTERNAL FIXATION (ORIF) DISTAL RADIAL FRACTURE (Right Wrist)     Patient location during evaluation: PACU Anesthesia Type: Regional and MAC Level of consciousness: awake and alert Pain management: pain level controlled Vital Signs Assessment: post-procedure vital signs reviewed and stable Respiratory status: spontaneous breathing, nonlabored ventilation, respiratory function stable and patient connected to nasal cannula oxygen Cardiovascular status: stable and blood pressure returned to baseline Postop Assessment: no apparent nausea or vomiting Anesthetic complications: no    Last Vitals:  Vitals:   03/20/19 1650 03/20/19 1710  BP:  (!) 113/59  Pulse: 71 70  Resp: 18 18  Temp:  36.7 C  SpO2: 93% 95%    Last Pain:  Vitals:   03/20/19 1710  TempSrc:   PainSc: 0-No pain                 Schawn Byas

## 2019-03-23 ENCOUNTER — Encounter (HOSPITAL_BASED_OUTPATIENT_CLINIC_OR_DEPARTMENT_OTHER): Payer: Self-pay | Admitting: Orthopedic Surgery

## 2019-03-25 NOTE — Op Note (Signed)
Intra-operative fluoroscopic images in the AP, lateral, and oblique views were taken and evaluated by myself.  Reduction and hardware placement were confirmed.  There was no intraarticular penetration of permanent hardware.  

## 2019-04-20 ENCOUNTER — Other Ambulatory Visit (HOSPITAL_COMMUNITY): Payer: Self-pay | Admitting: *Deleted

## 2019-04-21 ENCOUNTER — Other Ambulatory Visit: Payer: Self-pay

## 2019-04-21 ENCOUNTER — Ambulatory Visit (HOSPITAL_COMMUNITY)
Admission: RE | Admit: 2019-04-21 | Discharge: 2019-04-21 | Disposition: A | Discharge status: Home / Self Care | Payer: Medicare Other | Source: Ambulatory Visit | Attending: Internal Medicine | Admitting: Internal Medicine

## 2019-04-21 DIAGNOSIS — M810 Age-related osteoporosis without current pathological fracture: Secondary | ICD-10-CM | POA: Diagnosis not present

## 2019-04-21 MED ORDER — DENOSUMAB 60 MG/ML ~~LOC~~ SOSY
PREFILLED_SYRINGE | SUBCUTANEOUS | Status: AC
  Filled 2019-04-21: qty 1

## 2019-04-21 MED ORDER — DENOSUMAB 60 MG/ML ~~LOC~~ SOSY
60.0000 mg | PREFILLED_SYRINGE | Freq: Once | SUBCUTANEOUS | Status: AC
  Administered 2019-04-21: 60 mg via SUBCUTANEOUS

## 2019-04-21 NOTE — Discharge Instructions (Signed)
Denosumab injection °What is this medicine? °DENOSUMAB (den oh sue mab) slows bone breakdown. Prolia is used to treat osteoporosis in women after menopause and in men, and in people who are taking corticosteroids for 6 months or more. Xgeva is used to treat a high calcium level due to cancer and to prevent bone fractures and other bone problems caused by multiple myeloma or cancer bone metastases. Xgeva is also used to treat giant cell tumor of the bone. °This medicine may be used for other purposes; ask your health care provider or pharmacist if you have questions. °COMMON BRAND NAME(S): Prolia, XGEVA °What should I tell my health care provider before I take this medicine? °They need to know if you have any of these conditions: °· dental disease °· having surgery or tooth extraction °· infection °· kidney disease °· low levels of calcium or Vitamin D in the blood °· malnutrition °· on hemodialysis °· skin conditions or sensitivity °· thyroid or parathyroid disease °· an unusual reaction to denosumab, other medicines, foods, dyes, or preservatives °· pregnant or trying to get pregnant °· breast-feeding °How should I use this medicine? °This medicine is for injection under the skin. It is given by a health care professional in a hospital or clinic setting. °A special MedGuide will be given to you before each treatment. Be sure to read this information carefully each time. °For Prolia, talk to your pediatrician regarding the use of this medicine in children. Special care may be needed. For Xgeva, talk to your pediatrician regarding the use of this medicine in children. While this drug may be prescribed for children as young as 13 years for selected conditions, precautions do apply. °Overdosage: If you think you have taken too much of this medicine contact a poison control center or emergency room at once. °NOTE: This medicine is only for you. Do not share this medicine with others. °What if I miss a dose? °It is  important not to miss your dose. Call your doctor or health care professional if you are unable to keep an appointment. °What may interact with this medicine? °Do not take this medicine with any of the following medications: °· other medicines containing denosumab °This medicine may also interact with the following medications: °· medicines that lower your chance of fighting infection °· steroid medicines like prednisone or cortisone °This list may not describe all possible interactions. Give your health care provider a list of all the medicines, herbs, non-prescription drugs, or dietary supplements you use. Also tell them if you smoke, drink alcohol, or use illegal drugs. Some items may interact with your medicine. °What should I watch for while using this medicine? °Visit your doctor or health care professional for regular checks on your progress. Your doctor or health care professional may order blood tests and other tests to see how you are doing. °Call your doctor or health care professional for advice if you get a fever, chills or sore throat, or other symptoms of a cold or flu. Do not treat yourself. This drug may decrease your body's ability to fight infection. Try to avoid being around people who are sick. °You should make sure you get enough calcium and vitamin D while you are taking this medicine, unless your doctor tells you not to. Discuss the foods you eat and the vitamins you take with your health care professional. °See your dentist regularly. Brush and floss your teeth as directed. Before you have any dental work done, tell your dentist you are   receiving this medicine. Do not become pregnant while taking this medicine or for 5 months after stopping it. Talk with your doctor or health care professional about your birth control options while taking this medicine. Women should inform their doctor if they wish to become pregnant or think they might be pregnant. There is a potential for serious side  effects to an unborn child. Talk to your health care professional or pharmacist for more information. What side effects may I notice from receiving this medicine? Side effects that you should report to your doctor or health care professional as soon as possible:  allergic reactions like skin rash, itching or hives, swelling of the face, lips, or tongue  bone pain  breathing problems  dizziness  jaw pain, especially after dental work  redness, blistering, peeling of the skin  signs and symptoms of infection like fever or chills; cough; sore throat; pain or trouble passing urine  signs of low calcium like fast heartbeat, muscle cramps or muscle pain; pain, tingling, numbness in the hands or feet; seizures  unusual bleeding or bruising  unusually weak or tired Side effects that usually do not require medical attention (report to your doctor or health care professional if they continue or are bothersome):  constipation  diarrhea  headache  joint pain  loss of appetite  muscle pain  runny nose  tiredness  upset stomach This list may not describe all possible side effects. Call your doctor for medical advice about side effects. You may report side effects to FDA at 1-800-FDA-1088. Where should I keep my medicine? This medicine is only given in a clinic, doctor's office, or other health care setting and will not be stored at home. NOTE: This sheet is a summary. It may not cover all possible information. If you have questions about this medicine, talk to your doctor, pharmacist, or health care provider.  2020 [employer]/Gold Standard (2018-02-07 16:10:44)

## 2019-06-18 ENCOUNTER — Other Ambulatory Visit: Payer: Self-pay | Admitting: Internal Medicine

## 2019-06-18 DIAGNOSIS — Z1231 Encounter for screening mammogram for malignant neoplasm of breast: Secondary | ICD-10-CM

## 2019-06-18 DIAGNOSIS — N6310 Unspecified lump in the right breast, unspecified quadrant: Secondary | ICD-10-CM

## 2019-06-19 ENCOUNTER — Other Ambulatory Visit: Payer: Self-pay | Admitting: Internal Medicine

## 2019-06-19 DIAGNOSIS — N6310 Unspecified lump in the right breast, unspecified quadrant: Secondary | ICD-10-CM

## 2019-07-24 ENCOUNTER — Other Ambulatory Visit: Payer: Medicare Other

## 2019-07-28 ENCOUNTER — Other Ambulatory Visit: Payer: Medicare Other

## 2019-08-03 ENCOUNTER — Other Ambulatory Visit: Payer: Self-pay

## 2019-08-03 ENCOUNTER — Ambulatory Visit
Admission: RE | Admit: 2019-08-03 | Discharge: 2019-08-03 | Disposition: A | Discharge status: Home / Self Care | Payer: Medicare Other | Source: Ambulatory Visit | Attending: Internal Medicine | Admitting: Internal Medicine

## 2019-08-03 DIAGNOSIS — N6310 Unspecified lump in the right breast, unspecified quadrant: Secondary | ICD-10-CM

## 2019-10-12 ENCOUNTER — Ambulatory Visit: Payer: Medicare Other | Attending: Internal Medicine

## 2019-10-12 DIAGNOSIS — Z20822 Contact with and (suspected) exposure to covid-19: Secondary | ICD-10-CM

## 2019-10-14 LAB — NOVEL CORONAVIRUS, NAA: SARS-CoV-2, NAA: NOT DETECTED

## 2019-10-23 ENCOUNTER — Other Ambulatory Visit (HOSPITAL_COMMUNITY): Payer: Self-pay | Admitting: *Deleted

## 2019-10-26 ENCOUNTER — Other Ambulatory Visit: Payer: Self-pay

## 2019-10-26 ENCOUNTER — Ambulatory Visit (HOSPITAL_COMMUNITY)
Admission: RE | Admit: 2019-10-26 | Discharge: 2019-10-26 | Disposition: A | Discharge status: Home / Self Care | Payer: Medicare Other | Source: Ambulatory Visit | Attending: Internal Medicine | Admitting: Internal Medicine

## 2019-10-26 DIAGNOSIS — M810 Age-related osteoporosis without current pathological fracture: Secondary | ICD-10-CM | POA: Insufficient documentation

## 2019-10-26 MED ORDER — DENOSUMAB 60 MG/ML ~~LOC~~ SOSY
PREFILLED_SYRINGE | SUBCUTANEOUS | Status: AC
  Filled 2019-10-26: qty 1

## 2019-10-26 MED ORDER — DENOSUMAB 60 MG/ML ~~LOC~~ SOSY
60.0000 mg | PREFILLED_SYRINGE | Freq: Once | SUBCUTANEOUS | Status: AC
  Administered 2019-10-26: 60 mg via SUBCUTANEOUS

## 2019-12-10 ENCOUNTER — Ambulatory Visit: Payer: Medicare Other

## 2020-04-25 ENCOUNTER — Encounter (HOSPITAL_COMMUNITY): Payer: Medicare Other

## 2020-04-26 ENCOUNTER — Other Ambulatory Visit: Payer: Self-pay | Admitting: Neurosurgery

## 2020-04-26 DIAGNOSIS — M5136 Other intervertebral disc degeneration, lumbar region: Secondary | ICD-10-CM

## 2020-04-27 ENCOUNTER — Other Ambulatory Visit (HOSPITAL_COMMUNITY): Payer: Self-pay | Admitting: *Deleted

## 2020-04-28 ENCOUNTER — Ambulatory Visit (HOSPITAL_COMMUNITY)
Admission: RE | Admit: 2020-04-28 | Discharge: 2020-04-28 | Disposition: A | Discharge status: Home / Self Care | Payer: Medicare Other | Source: Ambulatory Visit | Attending: Internal Medicine | Admitting: Internal Medicine

## 2020-04-28 ENCOUNTER — Other Ambulatory Visit: Payer: Self-pay

## 2020-04-28 DIAGNOSIS — M810 Age-related osteoporosis without current pathological fracture: Secondary | ICD-10-CM | POA: Diagnosis not present

## 2020-04-28 MED ORDER — DENOSUMAB 60 MG/ML ~~LOC~~ SOSY: 60.0000 mg | PREFILLED_SYRINGE | Freq: Once | SUBCUTANEOUS | Status: AC

## 2020-04-28 MED ORDER — DENOSUMAB 60 MG/ML ~~LOC~~ SOSY
PREFILLED_SYRINGE | SUBCUTANEOUS | Status: AC
  Administered 2020-04-28: 60 mg via SUBCUTANEOUS
  Filled 2020-04-28: qty 1

## 2020-05-02 ENCOUNTER — Ambulatory Visit
Admission: RE | Admit: 2020-05-02 | Discharge: 2020-05-02 | Disposition: A | Discharge status: Home / Self Care | Payer: Medicare Other | Source: Ambulatory Visit | Attending: Neurosurgery | Admitting: Neurosurgery

## 2020-05-02 ENCOUNTER — Other Ambulatory Visit: Payer: Self-pay

## 2020-05-02 DIAGNOSIS — M5136 Other intervertebral disc degeneration, lumbar region: Secondary | ICD-10-CM

## 2020-05-02 MED ORDER — IOPAMIDOL (ISOVUE-M 200) INJECTION 41%
1.0000 mL | Freq: Once | INTRAMUSCULAR | Status: AC
  Administered 2020-05-02: 1 mL via EPIDURAL

## 2020-05-02 MED ORDER — METHYLPREDNISOLONE ACETATE 40 MG/ML INJ SUSP (RADIOLOG
120.0000 mg | Freq: Once | INTRAMUSCULAR | Status: AC
  Administered 2020-05-02: 120 mg via EPIDURAL

## 2020-05-02 NOTE — Discharge Instructions (Signed)

## 2020-06-15 ENCOUNTER — Ambulatory Visit (INDEPENDENT_AMBULATORY_CARE_PROVIDER_SITE_OTHER): Payer: Medicare Other | Admitting: Otolaryngology

## 2020-06-15 ENCOUNTER — Other Ambulatory Visit: Payer: Self-pay

## 2020-06-15 VITALS — Temp 96.1°F

## 2020-06-15 DIAGNOSIS — H6122 Impacted cerumen, left ear: Secondary | ICD-10-CM | POA: Diagnosis not present

## 2020-06-15 DIAGNOSIS — H903 Sensorineural hearing loss, bilateral: Secondary | ICD-10-CM

## 2020-06-15 DIAGNOSIS — H6983 Other specified disorders of Eustachian tube, bilateral: Secondary | ICD-10-CM

## 2020-06-15 NOTE — Progress Notes (Signed)
HPI: Savannah Harris is a 84 y.o. female who presents is referred by [facility] for evaluation of wax buildup in her ears.  She has longstanding history of hearing loss.  She also has a longstanding history of eustachian tube dysfunction and has had several sets of tubes in the past.  She used to see Dr. Dorma Russell and more recently has seen Dr. Annalee Genta.  She estimates her last set of tubes were placed a little over 2 years ago by Dr. Annalee Genta.  She has moderate severe hearing loss in both ears worse on the left side.  She is scheduled to get new hearing aids and [facility] wanted her ears cleaned prior to get new hearing aids..  Past Medical History:  Diagnosis Date  . Arthritis    WRIST/ HANDS  . Asymptomatic vertebral artery stenosis    RIGHT SIDE  . Diverticulitis 11-30-13   during Christmas holiday- resolved  . GERD (gastroesophageal reflux disease)   . Hyperlipidemia   . Hypertension    no meds  . Leg cramps    OCCASIONAL  . Sleep apnea 11-30-13   mild-no cpap.  . Type 2 diabetes mellitus (HCC)   . Urge urinary incontinence    Past Surgical History:  Procedure Laterality Date  . CATARACT EXTRACTION W/ INTRAOCULAR LENS  IMPLANT, BILATERAL    . COLONOSCOPY WITH PROPOFOL N/A 12/15/2013   Procedure: COLONOSCOPY WITH PROPOFOL;  Surgeon: Charolett Bumpers, MD;  Location: [facility];  Service: Endoscopy;  Laterality: N/A;  . INTERSTIM IMPLANT PLACEMENT N/A 07/28/2013   Procedure: Leane Platt IMPLANT FIRST STAGE;  Surgeon: Martina Sinner, MD;  Location: [facility];  Service: Urology;  Laterality: N/A;  . INTERSTIM IMPLANT PLACEMENT N/A 07/28/2013   Procedure: Leane Platt IMPLANT SECOND STAGE;  Surgeon: Martina Sinner, MD;  Location: [facility] [place];  Service: Urology;  Laterality: N/A;  . LEFT WRSIT TENDON REPAIR  1999  . OPEN REDUCTION INTERNAL FIXATION (ORIF) DISTAL RADIAL FRACTURE Right 03/20/2019   Procedure: OPEN REDUCTION INTERNAL FIXATION  (ORIF) DISTAL RADIAL FRACTURE;  Surgeon: Betha Loa, MD;  Location: [facility];  Service: Orthopedics;  Laterality: Right;  regional block  . PUBOVAGINAL SLING  08-26-2007   SPARC SLING  . TONSILLECTOMY AND ADENOIDECTOMY  AS CHILD  . TOTAL ABDOMINAL HYSTERECTOMY W/ BILATERAL SALPINGOOPHORECTOMY  1970s   AND APPENDECTOMY  . TOTAL HIP ARTHROPLASTY Right 12-16-2008   Social History   Socioeconomic History  . Marital status: Married    Spouse name: Not on file  . Number of children: Not on file  . Years of education: Not on file  . Highest education level: Not on file  Occupational History  . Not on file  Tobacco Use  . Smoking status: Never Smoker  . Smokeless tobacco: Never Used  Vaping Use  . Vaping Use: Never used  Substance and Sexual Activity  . Alcohol use: No    Alcohol/week: 0.0 standard drinks  . Drug use: No  . Sexual activity: Not Currently  Other Topics Concern  . Not on file  Social History Narrative  . Not on file   Social Determinants of Health   Financial Resource Strain:   . Difficulty of Paying Living Expenses: Not on file  Food Insecurity:   . Worried About Programme researcher, broadcasting/film/video in the Last Year: Not on file  . Ran Out of Food in the Last Year: Not on file  Transportation Needs:   . Lack of Transportation (  Medical): Not on file  . Lack of Transportation (Non-Medical): Not on file  Physical Activity:   . Days of Exercise per Week: Not on file  . Minutes of Exercise per Session: Not on file  Stress:   . Feeling of Stress : Not on file  Social Connections:   . Frequency of Communication with Friends and Family: Not on file  . Frequency of Social Gatherings with Friends and Family: Not on file  . Attends Religious Services: Not on file  . Active Member of Clubs or Organizations: Not on file  . Attends Banker Meetings: Not on file  . Marital Status: Not on file   Family History  Problem Relation Age of Onset  . Diabetes  Mother   . Alzheimer's disease Mother   . Dementia Mother    Allergies  Allergen Reactions  . Latex Other (See Comments)    BLISTERS  . Sulfamethoxazole-Trimethoprim Nausea And Vomiting  . Adhesive [Tape] Other (See Comments)    BLISTERS  . Canagliflozin Other (See Comments)    Pt states Invokana causes dizziness  . Codeine Nausea And Vomiting   Prior to Admission medications   Medication Sig Start Date End Date Taking? Authorizing Provider  aspirin EC 81 MG tablet Take 81 mg by mouth at bedtime.    [provider]  CANAGLIFLOZIN PO Take by mouth.    [provider]  Cholecalciferol (VITAMIN D3) 5000 UNITS CAPS Take 5,000 Units by mouth daily.     [provider]  cycloSPORINE (RESTASIS) 0.05 % ophthalmic emulsion Place 1 drop into both eyes 2 (two) times daily. May do addition dose    [provider]  Dulaglutide (TRULICITY) 1.5 MG/0.5ML SOPN Inject into the skin.    [provider]  glimepiride (AMARYL) 4 MG tablet Take 4 mg by mouth daily with breakfast.    [provider]  HYDROcodone-acetaminophen (NORCO) 5-325 MG tablet 1-2 tabs po q6 hours prn pain 03/20/19   Betha Loa, MD  metFORMIN (GLUMETZA) 1000 MG (MOD) 24 hr tablet Take 2 tablets by mouth daily. 06/28/17   [provider]  Omega-3 Fatty Acids (FISH OIL) 1000 MG CAPS Take 1 capsule by mouth daily.    [provider]  PRAVASTATIN SODIUM PO Take by mouth.    [provider]  sulfamethoxazole-trimethoprim (BACTRIM DS) 800-160 MG tablet Take 1 tablet by mouth 2 (two) times daily. 03/20/19   Betha Loa, MD     Positive ROS: Otherwise negative  All other systems have been reviewed and were otherwise negative with the exception of those mentioned in the HPI and as above.  Physical Exam: Constitutional: Alert, well-appearing, no acute distress Ears: External ears without lesions or tenderness.  Right ear canal is clear she has a T-tube in the  right ear with perforation of the TM which is larger than the tube.  No active drainage otherwise clear.  Left ear reveals a large amount of wax adherent to the left T-tube.  This was sensitive on removing the wax and a portion of the tube came out I replaced the tube back in the large perforation.  There is no signs of infection or cholesteatoma.  The wax was entirely removed. Nasal: External nose without lesions.. Clear nasal passages Oral: Lips and gums without lesions. Tongue and palate mucosa without lesions. Posterior oropharynx clear. Neck: No palpable adenopathy or masses Respiratory: Breathing comfortably  Skin: No facial/neck lesions or rash noted.  Cerumen impaction removal  Date/Time:  06/15/2020 9:00 AM Performed by: Drema Halon, MD Authorized by: Drema Halon, MD   Consent:    Consent obtained:  Verbal   Consent given by:  Patient   Risks discussed:  Pain and bleeding Procedure details:    Location:  L ear   Procedure type: forceps   Post-procedure details:    Inspection:  TM intact and canal normal   Hearing quality:  Improved   Patient tolerance of procedure:  Tolerated well, no immediate complications Comments:     Wax was adherent to the left T-tube which was removed in the office.  The tube remained in the large perforation of the left TM.  No signs of infection.    Assessment: Bilateral hearing loss. Longstanding use of T tubes.  Plan: The wax was removed from the left ear canal in the office today. Briefly discussed with her that when the tubes extrude she may or may not need the tubes replaced unless the perforations heal which on exam today they appear to be large chronic TM perforations.  Did recommend keeping water out of her ears as she has been doing.   Narda Bonds, MD   CC:

## 2020-06-16 ENCOUNTER — Encounter (INDEPENDENT_AMBULATORY_CARE_PROVIDER_SITE_OTHER): Payer: Self-pay

## 2020-09-16 ENCOUNTER — Other Ambulatory Visit: Payer: Self-pay | Admitting: Internal Medicine

## 2020-09-16 DIAGNOSIS — Z1231 Encounter for screening mammogram for malignant neoplasm of breast: Secondary | ICD-10-CM

## 2020-10-19 ENCOUNTER — Other Ambulatory Visit (HOSPITAL_COMMUNITY): Payer: Self-pay | Admitting: *Deleted

## 2020-10-20 ENCOUNTER — Ambulatory Visit (HOSPITAL_COMMUNITY)
Admission: RE | Admit: 2020-10-20 | Discharge: 2020-10-20 | Disposition: A | Discharge status: Home / Self Care | Payer: Medicare Other | Source: Ambulatory Visit | Attending: Internal Medicine | Admitting: Internal Medicine

## 2020-10-20 ENCOUNTER — Other Ambulatory Visit: Payer: Self-pay

## 2020-10-20 DIAGNOSIS — M810 Age-related osteoporosis without current pathological fracture: Secondary | ICD-10-CM | POA: Diagnosis not present

## 2020-10-20 MED ORDER — DENOSUMAB 60 MG/ML ~~LOC~~ SOSY
PREFILLED_SYRINGE | SUBCUTANEOUS | Status: AC
  Filled 2020-10-20: qty 1

## 2020-10-20 MED ORDER — DENOSUMAB 60 MG/ML ~~LOC~~ SOSY
60.0000 mg | PREFILLED_SYRINGE | Freq: Once | SUBCUTANEOUS | Status: AC
  Administered 2020-10-20: 60 mg via SUBCUTANEOUS

## 2020-10-28 ENCOUNTER — Ambulatory Visit: Payer: Medicare Other

## 2020-12-08 ENCOUNTER — Other Ambulatory Visit: Payer: Self-pay

## 2020-12-08 ENCOUNTER — Ambulatory Visit
Admission: RE | Admit: 2020-12-08 | Discharge: 2020-12-08 | Disposition: A | Discharge status: Home / Self Care | Payer: Medicare Other | Source: Ambulatory Visit | Attending: Internal Medicine | Admitting: Internal Medicine

## 2020-12-08 DIAGNOSIS — Z1231 Encounter for screening mammogram for malignant neoplasm of breast: Secondary | ICD-10-CM

## 2021-05-04 ENCOUNTER — Other Ambulatory Visit: Payer: Self-pay | Admitting: Internal Medicine

## 2021-05-04 DIAGNOSIS — M810 Age-related osteoporosis without current pathological fracture: Secondary | ICD-10-CM

## 2021-05-09 ENCOUNTER — Other Ambulatory Visit (HOSPITAL_COMMUNITY): Payer: Self-pay | Admitting: *Deleted

## 2021-05-15 ENCOUNTER — Ambulatory Visit (HOSPITAL_COMMUNITY)
Admission: RE | Admit: 2021-05-15 | Discharge: 2021-05-15 | Disposition: A | Discharge status: Home / Self Care | Payer: Medicare Other | Source: Ambulatory Visit | Attending: Cardiovascular Disease | Admitting: Cardiovascular Disease

## 2021-05-15 ENCOUNTER — Other Ambulatory Visit: Payer: Self-pay

## 2021-05-15 DIAGNOSIS — M810 Age-related osteoporosis without current pathological fracture: Secondary | ICD-10-CM | POA: Diagnosis present

## 2021-05-15 MED ORDER — DENOSUMAB 60 MG/ML ~~LOC~~ SOSY
60.0000 mg | PREFILLED_SYRINGE | Freq: Once | SUBCUTANEOUS | Status: AC
  Administered 2021-05-15: 60 mg via SUBCUTANEOUS

## 2021-05-15 MED ORDER — DENOSUMAB 60 MG/ML ~~LOC~~ SOSY
PREFILLED_SYRINGE | SUBCUTANEOUS | Status: AC
  Filled 2021-05-15: qty 1

## 2021-11-03 ENCOUNTER — Other Ambulatory Visit: Payer: Self-pay

## 2021-11-03 ENCOUNTER — Encounter: Payer: Self-pay | Admitting: Podiatrist

## 2021-11-03 ENCOUNTER — Ambulatory Visit: Payer: Medicare Other | Admitting: Podiatrist

## 2021-11-03 DIAGNOSIS — B351 Tinea unguium: Secondary | ICD-10-CM

## 2021-11-03 DIAGNOSIS — M79609 Pain in unspecified limb: Secondary | ICD-10-CM | POA: Diagnosis not present

## 2021-11-03 NOTE — Patient Instructions (Signed)

## 2021-11-06 ENCOUNTER — Other Ambulatory Visit: Payer: Self-pay | Admitting: Internal Medicine

## 2021-11-06 DIAGNOSIS — Z1231 Encounter for screening mammogram for malignant neoplasm of breast: Secondary | ICD-10-CM

## 2021-11-08 ENCOUNTER — Encounter: Payer: Self-pay | Admitting: Podiatrist

## 2021-11-08 NOTE — Progress Notes (Signed)
HPI: Patient is 86 y.o. diabetic female who presents today for a diabetic foot evaluation as well as for her toenails to be looked at as well.  She relates her blood sugar is under good control with a recent A1C of 6.3.  she relates no subjective complaints of neuropathy.  Relates the toenails become long and thick and pain ful in her shoes-  she has noticed the nails becoming more and more thick over the last several years. She has tried no treatment. .   Patient Active Problem List   Diagnosis Date Noted   Sleep apnea 02/22/2017   Memory loss 10/23/2014   Essential hypertension 10/23/2014   Hyperlipidemia 10/23/2014   Type 2 diabetes mellitus with complication (HCC) 10/23/2014   Diastasis recti 03/02/2013   Constipation, chronic 03/02/2013    Current Outpatient Medications on File Prior to Visit  Medication Sig Dispense Refill   aspirin EC 81 MG tablet Take 81 mg by mouth at bedtime.     Cholecalciferol (VITAMIN D3) 5000 UNITS CAPS Take 5,000 Units by mouth daily.      cycloSPORINE (RESTASIS) 0.05 % ophthalmic emulsion Place 1 drop into both eyes 2 (two) times daily. May do addition dose     Dulaglutide (TRULICITY) 1.5 MG/0.5ML SOPN Inject into the skin.     glimepiride (AMARYL) 4 MG tablet Take 4 mg by mouth daily with breakfast.     HYDROcodone-acetaminophen (NORCO) 5-325 MG tablet 1-2 tabs po q6 hours prn pain 30 tablet 0   metFORMIN (GLUMETZA) 1000 MG (MOD) 24 hr tablet Take 2 tablets by mouth daily.     Omega-3 Fatty Acids (FISH OIL) 1000 MG CAPS Take 1 capsule by mouth daily.     PRAVASTATIN SODIUM PO Take by mouth.     No current facility-administered medications on file prior to visit.    Allergies  Allergen Reactions   Latex Other (See Comments)    BLISTERS   Sulfamethoxazole-Trimethoprim Nausea And Vomiting   Adhesive [Tape] Other (See Comments)    BLISTERS   Canagliflozin Other (See Comments)    Pt states Invokana causes dizziness   Codeine Nausea And Vomiting     Review of Systems No fevers, chills, nausea, muscle aches, no difficulty breathing, no calf pain, no chest pain or shortness of breath.   Physical Exam  GENERAL APPEARANCE: Alert, conversant. Appropriately groomed. No acute distress.   VASCULAR: Pedal pulses palpable DP and PT bilateral.  Capillary refill time is immediate to all digits,  Proximal to distal cooling it warm to warm.  Digital perfusion adequate.   NEUROLOGIC: sensation is intact to 5.07 monofilament at 5/5 sites bilateral.  Light touch is intact bilateral, vibratory sensation intact bilateral  MUSCULOSKELETAL: acceptable muscle strength, tone and stability bilateral.  No gross boney pedal deformities noted.  No pain, crepitus or limitation noted with foot and ankle range of motion bilateral.   DERMATOLOGIC: skin is warm, supple, and dry.  No open lesions noted.  No rash, no pre ulcerative lesions. Digital nails are thick, discolored, dystrophic, brittle with subungual debris present and clinically mycotic x 10.     Assessment     ICD-10-CM   1. Pain due to onychomycosis of nail  B35.1    M79.609        Plan  Discussed exam findings as well as  Diabetic foot health with the patient.  Information and recommendations for caring for her feet was also dispensed. Soft, comfortable shoes were recommended for daily use.  Debridement of toenails was  also recommended.  Onychoreduction of symptomatic toenails was performed via nail nipper and power burr without iatrogenic incident.    Patient was instructed on signs and symptoms of infection and was told to call immediately should any of these arise.  Recommended follow up in 3 months or instructed to call sooner if any pedal concerns arise.

## 2021-11-10 ENCOUNTER — Ambulatory Visit
Admission: RE | Admit: 2021-11-10 | Discharge: 2021-11-10 | Disposition: A | Discharge status: Home / Self Care | Payer: Medicare Other | Source: Ambulatory Visit | Attending: Internal Medicine | Admitting: Internal Medicine

## 2021-11-10 DIAGNOSIS — M810 Age-related osteoporosis without current pathological fracture: Secondary | ICD-10-CM

## 2021-11-15 ENCOUNTER — Encounter (HOSPITAL_COMMUNITY): Payer: Medicare Other

## 2021-11-28 ENCOUNTER — Other Ambulatory Visit (HOSPITAL_COMMUNITY): Payer: Self-pay | Admitting: *Deleted

## 2021-11-29 ENCOUNTER — Other Ambulatory Visit: Payer: Self-pay

## 2021-11-29 ENCOUNTER — Ambulatory Visit (HOSPITAL_COMMUNITY)
Admission: RE | Admit: 2021-11-29 | Discharge: 2021-11-29 | Disposition: A | Discharge status: Home / Self Care | Payer: Medicare Other | Source: Ambulatory Visit | Attending: Internal Medicine | Admitting: Internal Medicine

## 2021-11-29 DIAGNOSIS — M810 Age-related osteoporosis without current pathological fracture: Secondary | ICD-10-CM | POA: Diagnosis not present

## 2021-11-29 MED ORDER — DENOSUMAB 60 MG/ML ~~LOC~~ SOSY
60.0000 mg | PREFILLED_SYRINGE | Freq: Once | SUBCUTANEOUS | Status: AC
  Administered 2021-11-29: 60 mg via SUBCUTANEOUS

## 2021-11-29 MED ORDER — DENOSUMAB 60 MG/ML ~~LOC~~ SOSY
PREFILLED_SYRINGE | SUBCUTANEOUS | Status: AC
  Filled 2021-11-29: qty 1

## 2021-12-11 ENCOUNTER — Ambulatory Visit: Payer: Medicare Other

## 2021-12-12 ENCOUNTER — Ambulatory Visit
Admission: RE | Admit: 2021-12-12 | Discharge: 2021-12-12 | Disposition: A | Discharge status: Home / Self Care | Payer: Medicare Other | Source: Ambulatory Visit | Attending: Internal Medicine | Admitting: Internal Medicine

## 2021-12-12 DIAGNOSIS — Z1231 Encounter for screening mammogram for malignant neoplasm of breast: Secondary | ICD-10-CM

## 2022-01-01 ENCOUNTER — Ambulatory Visit: Payer: Medicare Other | Admitting: Podiatrist

## 2022-01-01 ENCOUNTER — Encounter: Payer: Self-pay | Admitting: Podiatrist

## 2022-01-01 ENCOUNTER — Other Ambulatory Visit: Payer: Self-pay

## 2022-01-01 DIAGNOSIS — B351 Tinea unguium: Secondary | ICD-10-CM

## 2022-01-01 DIAGNOSIS — M79609 Pain in unspecified limb: Secondary | ICD-10-CM | POA: Diagnosis not present

## 2022-01-01 NOTE — Progress Notes (Signed)
Chief Complaint  ?Patient presents with  ? Nail Problem  ?   ?Routine foot care  ?  ? ?HPI: Patient is 86 y.o. female who presents today for periodic and professional diabetic foot care.  She states she is doing well no changes in past medical history medications or allergies from the previous visit.  Relates her toenails grow fast and she has pain with ambulation and in shoe gear when they become elongated. ? ? ?Allergies  ?Allergen Reactions  ? Latex Other (See Comments)  ?  BLISTERS  ? Sulfamethoxazole-Trimethoprim Nausea And Vomiting  ? Adhesive [Tape] Other (See Comments)  ?  BLISTERS  ? Canagliflozin Other (See Comments)  ?  Pt states Invokana causes dizziness  ? Codeine Nausea And Vomiting  ? ?Past Medical History:  ?Diagnosis Date  ? Arthritis   ? WRIST/ HANDS  ? Asymptomatic vertebral artery stenosis   ? RIGHT SIDE  ? Diverticulitis 11-30-13  ? during Christmas holiday- resolved  ? GERD (gastroesophageal reflux disease)   ? Hyperlipidemia   ? Hypertension   ? no meds  ? Leg cramps   ? OCCASIONAL  ? Sleep apnea 11-30-13  ? mild-no cpap.  ? Type 2 diabetes mellitus ([place])   ? Urge urinary incontinence   ? ? ? ?Review of systems is negative except as noted in the HPI.  Denies nausea/ vomiting/ fevers/ chills or night sweats.   Denies difficulty breathing, denies calf pain or tenderness ? ?Physical Exam ? ?Patient is awake, alert, and oriented x 3.  In no acute distress.   ? ?Vascular status is intact with palpable pedal pulses DP and PT bilateral and capillary refill time less than 3 seconds bilateral.  No edema or erythema noted.  ? ?Neurological exam reveals epicritic and protective sensation grossly intact bilateral.  ? ?Dermatological exam reveals skin is supple and dry to bilateral feet.  No open lesions present.  Digital nails are thick, discolored, dystrophic, brittle with subungual debris present and clinically mycotic x 10.   ? ? ?Musculoskeletal exam: Musculature intact with dorsiflexion,  plantarflexion, inversion, eversion. Ankle and First MPJ joint range of motion normal.  ? ? ? ?Assessment: ?  ICD-10-CM   ?1. Pain due to onychomycosis of nail  B35.1   ? M79.609   ?  ? ? ? ?Plan: ?Discussed exam findings with the patient.  Recommended a nail debridement.  This was carried out today with sterile nail nippers and a power burr without complication.  Periodic routine nail debridement recommended every 61 Days or as needed for follow-up.  ? ?

## 2022-02-21 ENCOUNTER — Other Ambulatory Visit: Payer: Self-pay | Admitting: Pain Medicine

## 2022-02-21 DIAGNOSIS — M5416 Radiculopathy, lumbar region: Secondary | ICD-10-CM

## 2022-02-27 ENCOUNTER — Other Ambulatory Visit: Payer: Medicare Other

## 2022-02-27 ENCOUNTER — Ambulatory Visit
Admission: RE | Admit: 2022-02-27 | Discharge: 2022-02-27 | Disposition: A | Discharge status: Home / Self Care | Payer: Medicare Other | Source: Ambulatory Visit | Attending: Pain Medicine | Admitting: Pain Medicine

## 2022-02-27 DIAGNOSIS — M5416 Radiculopathy, lumbar region: Secondary | ICD-10-CM

## 2022-02-27 MED ORDER — IOPAMIDOL (ISOVUE-M 300) INJECTION 61%
10.0000 mL | Freq: Once | INTRAMUSCULAR | Status: AC | PRN
  Administered 2022-02-27: 10 mL via INTRATHECAL

## 2022-02-27 MED ORDER — ONDANSETRON HCL 4 MG/2ML IJ SOLN: 4.0000 mg | Freq: Once | INTRAMUSCULAR | Status: DC | PRN

## 2022-02-27 MED ORDER — DIAZEPAM 5 MG PO TABS
5.0000 mg | ORAL_TABLET | Freq: Once | ORAL | Status: AC
  Administered 2022-02-27: 5 mg via ORAL

## 2022-02-27 MED ORDER — MEPERIDINE HCL 50 MG/ML IJ SOLN: 50.0000 mg | Freq: Once | INTRAMUSCULAR | Status: DC | PRN

## 2022-02-27 NOTE — Discharge Instructions (Signed)

## 2022-03-05 ENCOUNTER — Ambulatory Visit: Payer: Medicare Other | Admitting: Podiatrist

## 2022-03-05 ENCOUNTER — Encounter: Payer: Self-pay | Admitting: Podiatrist

## 2022-03-05 DIAGNOSIS — B351 Tinea unguium: Secondary | ICD-10-CM | POA: Diagnosis not present

## 2022-03-05 DIAGNOSIS — M79609 Pain in unspecified limb: Secondary | ICD-10-CM

## 2022-03-05 NOTE — Progress Notes (Signed)
Chief Complaint  Patient presents with   Nail Problem    Nail trim     HPI: Patient is 86 y.o. female who presents today for periodic and professional diabetic foot care.  She states she is doing well and recently had a Myelogram.  Otherwise denies any recent medical changes.    Relates her toenails grow fast and she has pain with ambulation and in shoe gear when they become elongated    Allergies  Allergen Reactions   Latex Other (See Comments)    BLISTERS   Sulfamethoxazole-Trimethoprim Nausea And Vomiting   Adhesive [Tape] Other (See Comments)    BLISTERS   Canagliflozin Other (See Comments)    Pt states Invokana causes dizziness   Codeine Nausea And Vomiting    Review of systems is negative except as noted in the HPI.  Denies nausea/ vomiting/ fevers/ chills or night sweats.   Denies difficulty breathing, denies calf pain or tenderness  Physical Exam   Patient is awake, alert, and oriented x 3.  In no acute distress.     Vascular status is intact with palpable pedal pulses DP and PT bilateral and capillary refill time less than 3 seconds bilateral.  No edema or erythema noted.    Neurological exam reveals epicritic and protective sensation grossly intact bilateral.    Dermatological exam reveals skin is supple and dry to bilateral feet.  No open lesions present.  Digital nails are thick, discolored, dystrophic, brittle with subungual debris present and clinically mycotic x 10.       Musculoskeletal exam: Musculature intact with dorsiflexion, plantarflexion, inversion, eversion. Ankle and First MPJ joint range of motion normal.        Assessment:     ICD-10-CM    1. Pain due to onychomycosis of nail  B35.1      M79.609             Plan: Discussed exam findings with the patient.  Recommended a nail debridement.  This was carried out today with sterile nail nippers and a power burr without complication.  Periodic routine nail debridement recommended every 61 Days or as  needed for follow-up.

## 2022-04-10 ENCOUNTER — Other Ambulatory Visit: Payer: Self-pay | Admitting: Orthopaedic Surgery

## 2022-04-10 DIAGNOSIS — M25562 Pain in left knee: Secondary | ICD-10-CM

## 2022-04-27 ENCOUNTER — Ambulatory Visit
Admission: RE | Admit: 2022-04-27 | Discharge: 2022-04-27 | Disposition: A | Discharge status: Home / Self Care | Payer: Medicare Other | Source: Ambulatory Visit | Attending: Orthopaedic Surgery | Admitting: Orthopaedic Surgery

## 2022-04-27 DIAGNOSIS — M25562 Pain in left knee: Secondary | ICD-10-CM

## 2022-04-30 ENCOUNTER — Other Ambulatory Visit (HOSPITAL_COMMUNITY): Payer: Self-pay | Admitting: Internal Medicine

## 2022-04-30 ENCOUNTER — Ambulatory Visit (HOSPITAL_COMMUNITY)
Admission: RE | Admit: 2022-04-30 | Discharge: 2022-04-30 | Disposition: A | Discharge status: Home / Self Care | Payer: Medicare Other | Source: Ambulatory Visit | Attending: Surgery | Admitting: Surgery

## 2022-04-30 DIAGNOSIS — R0989 Other specified symptoms and signs involving the circulatory and respiratory systems: Secondary | ICD-10-CM

## 2022-06-06 ENCOUNTER — Ambulatory Visit: Payer: Medicare Other | Admitting: Podiatry

## 2022-06-27 ENCOUNTER — Other Ambulatory Visit: Payer: Self-pay

## 2022-06-27 ENCOUNTER — Encounter (HOSPITAL_BASED_OUTPATIENT_CLINIC_OR_DEPARTMENT_OTHER): Payer: Self-pay | Admitting: Emergency Medicine

## 2022-06-27 ENCOUNTER — Emergency Department (HOSPITAL_BASED_OUTPATIENT_CLINIC_OR_DEPARTMENT_OTHER)
Admission: EM | Admit: 2022-06-27 | Discharge: 2022-06-27 | Disposition: A | Discharge status: Home / Self Care | Payer: Medicare Other | Attending: Emergency Medicine | Admitting: Emergency Medicine

## 2022-06-27 ENCOUNTER — Emergency Department (HOSPITAL_BASED_OUTPATIENT_CLINIC_OR_DEPARTMENT_OTHER): Payer: Medicare Other

## 2022-06-27 DIAGNOSIS — Z91040 Latex allergy status: Secondary | ICD-10-CM | POA: Diagnosis not present

## 2022-06-27 DIAGNOSIS — D72819 Decreased white blood cell count, unspecified: Secondary | ICD-10-CM | POA: Insufficient documentation

## 2022-06-27 DIAGNOSIS — Z7982 Long term (current) use of aspirin: Secondary | ICD-10-CM | POA: Diagnosis not present

## 2022-06-27 DIAGNOSIS — Y92002 Bathroom of unspecified non-institutional (private) residence single-family (private) house as the place of occurrence of the external cause: Secondary | ICD-10-CM | POA: Diagnosis not present

## 2022-06-27 DIAGNOSIS — Z79899 Other long term (current) drug therapy: Secondary | ICD-10-CM | POA: Diagnosis not present

## 2022-06-27 DIAGNOSIS — E119 Type 2 diabetes mellitus without complications: Secondary | ICD-10-CM | POA: Insufficient documentation

## 2022-06-27 DIAGNOSIS — Z8616 Personal history of COVID-19: Secondary | ICD-10-CM | POA: Diagnosis not present

## 2022-06-27 DIAGNOSIS — Z7984 Long term (current) use of oral hypoglycemic drugs: Secondary | ICD-10-CM | POA: Insufficient documentation

## 2022-06-27 DIAGNOSIS — W01198A Fall on same level from slipping, tripping and stumbling with subsequent striking against other object, initial encounter: Secondary | ICD-10-CM | POA: Diagnosis not present

## 2022-06-27 DIAGNOSIS — R519 Headache, unspecified: Secondary | ICD-10-CM | POA: Insufficient documentation

## 2022-06-27 DIAGNOSIS — I10 Essential (primary) hypertension: Secondary | ICD-10-CM | POA: Insufficient documentation

## 2022-06-27 DIAGNOSIS — R42 Dizziness and giddiness: Secondary | ICD-10-CM | POA: Insufficient documentation

## 2022-06-27 DIAGNOSIS — R413 Other amnesia: Secondary | ICD-10-CM | POA: Diagnosis not present

## 2022-06-27 DIAGNOSIS — R531 Weakness: Secondary | ICD-10-CM | POA: Diagnosis not present

## 2022-06-27 DIAGNOSIS — R7989 Other specified abnormal findings of blood chemistry: Secondary | ICD-10-CM | POA: Insufficient documentation

## 2022-06-27 DIAGNOSIS — W19XXXA Unspecified fall, initial encounter: Secondary | ICD-10-CM

## 2022-06-27 LAB — CBC WITH DIFFERENTIAL/PLATELET
Abs Immature Granulocytes: 0.01 10*3/uL (ref 0.00–0.07)
Basophils Absolute: 0 10*3/uL (ref 0.0–0.1)
Basophils Relative: 1 %
Eosinophils Absolute: 0 10*3/uL (ref 0.0–0.5)
Eosinophils Relative: 1 %
HCT: 37.5 % (ref 36.0–46.0)
Hemoglobin: 12.4 g/dL (ref 12.0–15.0)
Immature Granulocytes: 0 %
Lymphocytes Relative: 22 %
Lymphs Abs: 0.7 10*3/uL (ref 0.7–4.0)
MCH: 30 pg (ref 26.0–34.0)
MCHC: 33.1 g/dL (ref 30.0–36.0)
MCV: 90.6 fL (ref 80.0–100.0)
Monocytes Absolute: 0.5 10*3/uL (ref 0.1–1.0)
Monocytes Relative: 14 %
Neutro Abs: 2 10*3/uL (ref 1.7–7.7)
Neutrophils Relative %: 62 %
Platelets: 117 10*3/uL — ABNORMAL LOW (ref 150–400)
RBC: 4.14 MIL/uL (ref 3.87–5.11)
RDW: 13 % (ref 11.5–15.5)
WBC: 3.2 10*3/uL — ABNORMAL LOW (ref 4.0–10.5)
nRBC: 0 % (ref 0.0–0.2)

## 2022-06-27 LAB — COMPREHENSIVE METABOLIC PANEL
ALT: 24 U/L (ref 0–44)
AST: 39 U/L (ref 15–41)
Albumin: 4.4 g/dL (ref 3.5–5.0)
Alkaline Phosphatase: 38 U/L (ref 38–126)
Anion gap: 10 (ref 5–15)
BUN: 24 mg/dL — ABNORMAL HIGH (ref 8–23)
CO2: 29 mmol/L (ref 22–32)
Calcium: 9 mg/dL (ref 8.9–10.3)
Chloride: 102 mmol/L (ref 98–111)
Creatinine, Ser: 1.21 mg/dL — ABNORMAL HIGH (ref 0.44–1.00)
GFR, Estimated: 44 mL/min — ABNORMAL LOW (ref >60)
Glucose, Bld: 76 mg/dL (ref 70–99)
Potassium: 3.9 mmol/L (ref 3.5–5.1)
Sodium: 141 mmol/L (ref 135–145)
Total Bilirubin: 0.5 mg/dL (ref 0.3–1.2)
Total Protein: 6.9 g/dL (ref 6.5–8.1)

## 2022-06-27 LAB — CBG MONITORING, ED: Glucose-Capillary: 70 mg/dL (ref 70–99)

## 2022-06-27 NOTE — Discharge Instructions (Signed)
You were seen in the emergency department today after a fall.  The scan of your head and neck is normal.  Your lab work also looks reassuring.  Please continue to recover from your COVID diagnosis.  Please be careful and move slowly when moving from a laying to a sitting and from a sitting to a standing position.  Please return if you have any concerns.

## 2022-06-27 NOTE — ED Notes (Signed)
Fall this past Monday early am, states she felt a "pop" in her head/neck.   ?LOC states she could not get up for 3 hrs. Husband found her later that morning. Does c/o of HA

## 2022-06-27 NOTE — ED Provider Notes (Signed)
[facility] EMERGENCY DEPT Provider Note   CSN: [id number] Arrival date & time: 06/27/22  1711     History  Chief Complaint  Patient presents with   Savannah Harris is a 86 y.o. female. With past medical history of T2DM, HTN, HLD who presents to the emergency department with fall   States Monday she was at home in the bathroom when she fell.  States that she was going to leave the bathroom when she was sort of shuffling her feet and felt weak.  States that she felt dizzy prior to falling.  States that when she began to feel dizzy she turned around to go to back into the bathroom and sit down when she fell backward.  She struck the back of her head on unknown object in the bathroom.  She is unsure if she lost consciousness.  She states that since then she has had ongoing headache and forgetfulness.  She does feel dizzy intermittently.  She denies vomiting or visual changes or photophobia.  She denies neck pain.  She denies any chest pain, shortness of breath, dysuria, nausea, vomiting or diarrhea.  States that Tuesday she was diagnosed with COVID-19.  She is on Paxlovid.  Fall Associated symptoms include headaches.       Home Medications Prior to Admission medications   Medication Sig Start Date End Date Taking? Authorizing Provider  aspirin EC 81 MG tablet Take 81 mg by mouth at bedtime.    [provider]  Cholecalciferol (VITAMIN D3) 5000 UNITS CAPS Take 5,000 Units by mouth daily.     [provider]  cycloSPORINE (RESTASIS) 0.05 % ophthalmic emulsion Place 1 drop into both eyes 2 (two) times daily. May do addition dose    [provider]  Dulaglutide (TRULICITY) 1.5 MG/0.5ML SOPN Inject into the skin.    [provider]  glimepiride (AMARYL) 4 MG tablet Take 4 mg by mouth daily with breakfast.    [provider]  HYDROcodone-acetaminophen (NORCO) 5-325 MG tablet 1-2 tabs po q6 hours prn pain 03/20/19   Betha Loa, MD  metFORMIN (GLUMETZA) 1000 MG (MOD) 24 hr tablet Take 2 tablets by mouth daily. 06/28/17   [provider]  Omega-3 Fatty Acids (FISH OIL) 1000 MG CAPS Take 1 capsule by mouth daily.    [provider]  PRAVASTATIN SODIUM PO Take by mouth.    [provider]      Allergies    Latex, Sulfamethoxazole-trimethoprim, Adhesive [tape], Canagliflozin, and Codeine    Review of Systems   Review of Systems  Neurological:  Positive for dizziness, weakness and headaches.  All other systems reviewed and are negative.   Physical Exam Updated Vital Signs BP (!) 140/69 (BP Location: Right Arm)   Pulse 63   Temp 97.7 F (36.5 C)   Resp 18   Ht 5' (1.524 m)   Wt 58.1 kg   SpO2 97%   BMI 25.00 kg/m  Physical Exam Vitals and nursing note reviewed.  Constitutional:      General: She is not in acute distress.    Appearance: Normal appearance. She is ill-appearing. She is not toxic-appearing.  HENT:     Head: Normocephalic and atraumatic.  Eyes:     General: No scleral icterus.    Extraocular Movements: Extraocular movements intact.     Pupils: Pupils are equal, round, and reactive to light.  Cardiovascular:     Rate and Rhythm: Normal rate and regular  rhythm.     Pulses: Normal pulses.  Pulmonary:     Effort: Pulmonary effort is normal. No respiratory distress.     Breath sounds: Normal breath sounds. No wheezing, rhonchi or rales.  Abdominal:     General: Bowel sounds are normal.     Palpations: Abdomen is soft.  Musculoskeletal:        General: Normal range of motion.     Cervical back: Neck supple. No tenderness.  Skin:    General: Skin is warm and dry.     Capillary Refill: Capillary refill takes less than 2 seconds.     Findings: No bruising.  Neurological:     General: No focal deficit present.     Mental Status: She is alert and oriented to person, place, and time. Mental status is at baseline.     Cranial Nerves: No cranial nerve deficit.      Motor: No weakness.  Psychiatric:        Mood and Affect: Mood normal.        Behavior: Behavior normal.        Thought Content: Thought content normal.        Judgment: Judgment normal.    ED Results / Procedures / Treatments   Labs (all labs ordered are listed, but only abnormal results are displayed) Labs Reviewed  CBC WITH DIFFERENTIAL/PLATELET - Abnormal; Notable for the following components:      Result Value   WBC 3.2 (*)    Platelets 117 (*)    All other components within normal limits  COMPREHENSIVE METABOLIC PANEL - Abnormal; Notable for the following components:   BUN 24 (*)    Creatinine, Ser 1.21 (*)    GFR, Estimated 44 (*)    All other components within normal limits  CBG MONITORING, ED   EKG None  Radiology CT Head Wo Contrast  Result Date: 06/27/2022 CLINICAL DATA:  Head trauma, moderate-severe; Neck trauma (Age >= 65y). Recent fall. EXAM: CT HEAD WITHOUT CONTRAST CT CERVICAL SPINE WITHOUT CONTRAST TECHNIQUE: Multidetector CT imaging of the head and cervical spine was performed following the standard protocol without intravenous contrast. Multiplanar CT image reconstructions of the cervical spine were also generated. RADIATION DOSE REDUCTION: This exam was performed according to the departmental dose-optimization program which includes automated exposure control, adjustment of the mA and/or kV according to patient size and/or use of iterative reconstruction technique. COMPARISON:  Head CT 10/25/2014. Head MRI 12/31/2012. Cervical CT myelogram 03/18/2017. FINDINGS: CT HEAD FINDINGS Brain: There is no evidence of an acute infarct, intracranial hemorrhage, mass, midline shift, or extra-axial fluid collection. There is mild cerebral atrophy. Unchanged small chronic infarcts are noted in the anterior right basal ganglia, left middle frontal gyrus, and left cerebellar hemisphere. Hypodensities elsewhere in the cerebral white matter are nonspecific but compatible with  mild chronic small vessel ischemic disease. Vascular: Calcified atherosclerosis at the skull base. No hyperdense vessel. Skull: No acute fracture or suspicious osseous lesion. Sinuses/Orbits: Mild mucosal thickening in the ethmoid air cells bilaterally. Complete opacification of the right frontal sinus. Trace chronic right mastoid effusion. Bilateral cataract extraction. Other: None. CT CERVICAL SPINE FINDINGS Alignment: Mild chronic reversal of the normal cervical lordosis and chronic grade 1 anterolisthesis of C4 on C5, C5 on C6, C7 on T1, T1 on T2, and T2 on T3. Skull base and vertebrae: No acute fracture or suspicious osseous lesion. Unchanged sclerotic focus in the C7 vertebra, likely a bone island. Soft tissues and spinal canal: No prevertebral  fluid or swelling. No visible canal hematoma. Disc levels: Moderate disc degeneration, greatest at C6-7. Widespread, severe facet arthrosis. Mild-to-moderate multilevel neural foraminal stenosis. Upper chest: Clear lung apices. Other: None. IMPRESSION: 1. No evidence of acute intracranial abnormality. 2. 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 3. No acute cervical spine fracture. Electronically Signed   By: Sebastian Ache M.D.   On: 06/27/2022 18:48   CT Cervical Spine Wo Contrast  Result Date: 06/27/2022 CLINICAL DATA:  Head trauma, moderate-severe; Neck trauma (Age >= 65y). Recent fall. EXAM: CT HEAD WITHOUT CONTRAST CT CERVICAL SPINE WITHOUT CONTRAST TECHNIQUE: Multidetector CT imaging of the head and cervical spine was performed following the standard protocol without intravenous contrast. Multiplanar CT image reconstructions of the cervical spine were also generated. RADIATION DOSE REDUCTION: This exam was performed according to the departmental dose-optimization program which includes automated exposure control, adjustment of the mA and/or kV according to patient size and/or use of iterative reconstruction technique. COMPARISON:  Head CT 10/25/2014.  Head MRI 12/31/2012. Cervical CT myelogram 03/18/2017. FINDINGS: CT HEAD FINDINGS Brain: There is no evidence of an acute infarct, intracranial hemorrhage, mass, midline shift, or extra-axial fluid collection. There is mild cerebral atrophy. Unchanged small chronic infarcts are noted in the anterior right basal ganglia, left middle frontal gyrus, and left cerebellar hemisphere. Hypodensities elsewhere in the cerebral white matter are nonspecific but compatible with mild chronic small vessel ischemic disease. Vascular: Calcified atherosclerosis at the skull base. No hyperdense vessel. Skull: No acute fracture or suspicious osseous lesion. Sinuses/Orbits: Mild mucosal thickening in the ethmoid air cells bilaterally. Complete opacification of the right frontal sinus. Trace chronic right mastoid effusion. Bilateral cataract extraction. Other: None. CT CERVICAL SPINE FINDINGS Alignment: Mild chronic reversal of the normal cervical lordosis and chronic grade 1 anterolisthesis of C4 on C5, C5 on C6, C7 on T1, T1 on T2, and T2 on T3. Skull base and vertebrae: No acute fracture or suspicious osseous lesion. Unchanged sclerotic focus in the C7 vertebra, likely a bone island. Soft tissues and spinal canal: No prevertebral fluid or swelling. No visible canal hematoma. Disc levels: Moderate disc degeneration, greatest at C6-7. Widespread, severe facet arthrosis. Mild-to-moderate multilevel neural foraminal stenosis. Upper chest: Clear lung apices. Other: None. IMPRESSION: 1. No evidence of acute intracranial abnormality. 2. 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 3. No acute cervical spine fracture. Electronically Signed   By: Sebastian Ache M.D.   On: 06/27/2022 18:48    Procedures Procedures   Medications Ordered in ED Medications - No data to display  ED Course/ Medical Decision Making/ A&P                           Medical Decision Making Amount and/or Complexity of Data Reviewed Labs:  ordered. Radiology: ordered.  This patient presents to the ED with chief complaint(s) of fall with pertinent past medical history of type 2 diabetes, hypertension which further complicates the presenting complaint. The complaint involves an extensive differential diagnosis and also carries with it a high risk of complications and morbidity.    The differential diagnosis includes intracranial bleed, C-spine injury, ACS or PE or dehydration, etc.  Additional history obtained: Additional history obtained from spouse Records reviewed Care Everywhere/External Records and Primary Care Documents  ED Course and Reassessment: 86 year old female who presents to the emergency department 2 days after fall.  Physical exam is unremarkable.  There is no obvious external trauma to the head or neck.  No trauma to the rest of her body.  No focal neurological deficits on exam.  Was diagnosed with COVID on Tuesday, lungs are clear.  Obtain CT head and C-spine after fall with unknown loss of consciousness.  She did have some dizziness prior to her fall and because of this I am obtaining a EKG, basic labs, orthostatics.  CT head and C-spine are unremarkable.  She is not orthostatic.  Her electrolytes are within normal limits.  Mildly elevated creatinine but this appears to be baseline.  Mild leukopenia which is likely from her COVID diagnosis.  Her glucose was 70 which we gave her some food.  EKG is without any obvious ischemia or infarction.  Doubt ACS as a component of this.  Overall feel that she is well-appearing, nontoxic in appearance, hemodynamically stable.  Feel that she is safe for discharge.  Can return for worsening symptoms but have low suspicion for emergent etiology of her fall.  She is in agreement with this plan.  Discussed this with Dr. Rubin Payor, attending who agrees.  Follow-up with PCP.  She verbalized understanding.  Independent labs interpretation:  The following labs were independently  interpreted:  CBC with leukopenia, consistent with COVID diagnosis CMP with mildly elevated creatinine, appears stable from previous Glucose 70, given food  Independent visualization of imaging: - I independently visualized the following imaging with scope of interpretation limited to determining acute life threatening conditions related to emergency care: CT head and C-spine, which revealed no acute abnormalities  Consultation: - Consulted or discussed management/test interpretation w/ external professional: Not indicated  Consideration for admission or further workup: Not indicated Social Determinants of health: None identified Final Clinical Impression(s) / ED Diagnoses Final diagnoses:  Fall, initial encounter    Rx / DC Orders ED Discharge Orders     None         Cristopher Peru, PA-C 06/27/22 1918    Benjiman Core, MD 06/27/22 2350

## 2022-06-27 NOTE — ED Triage Notes (Addendum)
Pt via pov from home after mechanical fall early Monday morning. She reports that she hit her head ("it popped.") and her back. She states she lay in the floor for about three hours and was unable to get up. She had immediate headache, thinks she either dozed or lost consciousness. Pt reports that she didn't feel like she needed to be seen but that she has been "addled" and can't remember much (an ongoing concern). Pt alert & oriented, came today because her friends told her she should, as she is concerned for a brain bleed. NAD noted.

## 2022-06-27 NOTE — ED Notes (Signed)
Patient transported to CT 

## 2022-10-30 ENCOUNTER — Other Ambulatory Visit: Payer: Self-pay | Admitting: Internal Medicine

## 2022-10-30 DIAGNOSIS — Z1231 Encounter for screening mammogram for malignant neoplasm of breast: Secondary | ICD-10-CM

## 2022-12-19 ENCOUNTER — Ambulatory Visit
Admission: RE | Admit: 2022-12-19 | Discharge: 2022-12-19 | Disposition: A | Discharge status: Home / Self Care | Payer: Medicare Other | Source: Ambulatory Visit | Attending: Internal Medicine | Admitting: Internal Medicine

## 2022-12-19 DIAGNOSIS — Z1231 Encounter for screening mammogram for malignant neoplasm of breast: Secondary | ICD-10-CM

## 2023-01-23 ENCOUNTER — Encounter (HOSPITAL_COMMUNITY): Payer: Self-pay

## 2023-01-23 ENCOUNTER — Other Ambulatory Visit (HOSPITAL_COMMUNITY): Payer: Self-pay | Admitting: Physician Assistant

## 2023-01-23 ENCOUNTER — Ambulatory Visit (HOSPITAL_COMMUNITY)
Admission: RE | Admit: 2023-01-23 | Discharge: 2023-01-23 | Disposition: A | Discharge status: Home / Self Care | Payer: Medicare Other | Source: Ambulatory Visit | Attending: Physician Assistant | Admitting: Physician Assistant

## 2023-01-23 DIAGNOSIS — M8008XA Age-related osteoporosis with current pathological fracture, vertebra(e), initial encounter for fracture: Secondary | ICD-10-CM

## 2023-01-24 ENCOUNTER — Other Ambulatory Visit: Payer: Self-pay | Admitting: Internal Medicine

## 2023-01-24 DIAGNOSIS — M810 Age-related osteoporosis without current pathological fracture: Secondary | ICD-10-CM

## 2023-01-24 DIAGNOSIS — S32010A Wedge compression fracture of first lumbar vertebra, initial encounter for closed fracture: Secondary | ICD-10-CM

## 2023-01-30 ENCOUNTER — Other Ambulatory Visit: Payer: Self-pay | Admitting: Neurological Surgery

## 2023-02-01 NOTE — Pre-Procedure Instructions (Signed)
Surgical Instructions    Your procedure is scheduled on Friday, April 26th.  Report to [facility] Main Entrance "A" at 05:30 A.M., then check in with the Admitting office.  Call this number if you have problems the morning of surgery:  [phone]  If you have any questions prior to your surgery date call [phone]: Open Monday-Friday 8am-4pm If you experience any cold or flu symptoms such as cough, fever, chills, shortness of breath, etc. between now and your scheduled surgery, please notify us at the above number.     Remember:  Do not eat after midnight the night before your surgery  You may drink clear liquids until 04:30 AM the morning of your surgery.   Clear liquids allowed are: Water, Non-Citrus Juices (without pulp), Carbonated Beverages, Clear Tea, Black Coffee Only (NO MILK, CREAM OR POWDERED CREAMER of any kind), and Gatorade.    Take these medicines the morning of surgery with A SIP OF WATER  cycloSPORINE (RESTASIS) eye drops rosuvastatin (CRESTOR)  sertraline (ZOLOFT)    If needed: acetaminophen (TYLENOL)  Brimonidine Tartrate (LUMIFY) eye drops  fluticasone (FLONASE)    Follow your surgeon's instructions on when to stop Aspirin.  If no instructions were given by your surgeon then you will need to call the office to get those instructions.     As of today, STOP taking any Aleve, Naproxen, Ibuprofen, Motrin, Advil, Goody's, BC's, all herbal medications, fish oil, and all vitamins.  WHAT DO I DO ABOUT MY DIABETES MEDICATION?   Do not take glimepiride (AMARYL) or metFORMIN (GLUCOPHAGE) the morning of surgery.  Stop taking Dulaglutide (TRULICITY) 7 days prior to surgery.         HOW TO MANAGE YOUR DIABETES BEFORE AND AFTER SURGERY  Why is it important to control my blood sugar before and after surgery? Improving blood sugar levels before and after surgery helps healing and can limit problems. A way of improving blood sugar control is eating a healthy diet  by:  Eating less sugar and carbohydrates  Increasing activity/exercise  Talking with your doctor about reaching your blood sugar goals High blood sugars (greater than 180 mg/dL) can raise your risk of infections and slow your recovery, so you will need to focus on controlling your diabetes during the weeks before surgery. Make sure that the doctor who takes care of your diabetes knows about your planned surgery including the date and location.  How do I manage my blood sugar before surgery? Check your blood sugar at least 4 times a day, starting 2 days before surgery, to make sure that the level is not too high or low.  Check your blood sugar the morning of your surgery when you wake up and every 2 hours until you get to the Short Stay unit.  If your blood sugar is less than 70 mg/dL, you will need to treat for low blood sugar: Do not take insulin. Treat a low blood sugar (less than 70 mg/dL) with  cup of clear juice (cranberry or apple), 4 glucose tablets, OR glucose gel. Recheck blood sugar in 15 minutes after treatment (to make sure it is greater than 70 mg/dL). If your blood sugar is not greater than 70 mg/dL on recheck, call [phone] for further instructions. Report your blood sugar to the short stay nurse when you get to Short Stay.  If you are admitted to the hospital after surgery: Your blood sugar will be checked by the staff and you will probably be given insulin after  surgery (instead of oral diabetes medicines) to make sure you have good blood sugar levels. The goal for blood sugar control after surgery is 80-180 mg/dL.                     Do NOT Smoke (Tobacco/Vaping) for 24 hours prior to your procedure.  If you use a CPAP at night, you may bring your mask/headgear for your overnight stay.   Contacts, glasses, piercing's, hearing aid's, dentures or partials may not be worn into surgery, please bring cases for these belongings.    For patients admitted to the hospital,  discharge time will be determined by your treatment team.   Patients discharged the day of surgery will not be allowed to drive home, and someone needs to stay with them for 24 hours.  SURGICAL WAITING ROOM VISITATION Patients having surgery or a procedure may have no more than 2 support people in the waiting area - these visitors may rotate.   Children under the age of 48 must have an adult with them who is not the patient. If the patient needs to stay at the hospital during part of their recovery, the visitor guidelines for inpatient rooms apply. Pre-op nurse will coordinate an appropriate time for 1 support person to accompany patient in pre-op.  This support person may not rotate.   Please refer to the [facility] website for the visitor guidelines for Inpatients (after your surgery is over and you are in a regular room).      If you received a COVID test during your pre-op visit  it is requested that you wear a mask when out in public, stay away from anyone that may not be feeling well and notify your surgeon if you develop symptoms. If you have been in contact with anyone that has tested positive in the last 10 days please notify you surgeon.

## 2023-02-04 ENCOUNTER — Other Ambulatory Visit: Payer: Self-pay

## 2023-02-04 ENCOUNTER — Encounter (HOSPITAL_COMMUNITY)
Admission: RE | Admit: 2023-02-04 | Discharge: 2023-02-04 | Disposition: A | Discharge status: Home / Self Care | Payer: Medicare Other | Source: Ambulatory Visit | Attending: Neurological Surgery | Admitting: Neurological Surgery

## 2023-02-04 ENCOUNTER — Encounter (HOSPITAL_COMMUNITY): Payer: Self-pay

## 2023-02-04 VITALS — BP 155/86 | HR 63 | Temp 97.3°F | Resp 18 | Ht 59.0 in | Wt 121.8 lb

## 2023-02-04 DIAGNOSIS — Z01812 Encounter for preprocedural laboratory examination: Secondary | ICD-10-CM | POA: Insufficient documentation

## 2023-02-04 DIAGNOSIS — E118 Type 2 diabetes mellitus with unspecified complications: Secondary | ICD-10-CM | POA: Diagnosis not present

## 2023-02-04 DIAGNOSIS — I10 Essential (primary) hypertension: Secondary | ICD-10-CM | POA: Insufficient documentation

## 2023-02-04 DIAGNOSIS — Z01818 Encounter for other preprocedural examination: Secondary | ICD-10-CM

## 2023-02-04 LAB — CBC
HCT: 40.3 % (ref 36.0–46.0)
Hemoglobin: 12.9 g/dL (ref 12.0–15.0)
MCH: 29.2 pg (ref 26.0–34.0)
MCHC: 32 g/dL (ref 30.0–36.0)
MCV: 91.2 fL (ref 80.0–100.0)
Platelets: 182 10*3/uL (ref 150–400)
RBC: 4.42 MIL/uL (ref 3.87–5.11)
RDW: 13.5 % (ref 11.5–15.5)
WBC: 6.3 10*3/uL (ref 4.0–10.5)
nRBC: 0 % (ref 0.0–0.2)

## 2023-02-04 LAB — BASIC METABOLIC PANEL
Anion gap: 13 (ref 5–15)
BUN: 30 mg/dL — ABNORMAL HIGH (ref 8–23)
CO2: 24 mmol/L (ref 22–32)
Calcium: 9.3 mg/dL (ref 8.9–10.3)
Chloride: 101 mmol/L (ref 98–111)
Creatinine, Ser: 1.01 mg/dL — ABNORMAL HIGH (ref 0.44–1.00)
GFR, Estimated: 55 mL/min — ABNORMAL LOW (ref >60)
Glucose, Bld: 127 mg/dL — ABNORMAL HIGH (ref 70–99)
Potassium: 4.3 mmol/L (ref 3.5–5.1)
Sodium: 138 mmol/L (ref 135–145)

## 2023-02-04 LAB — HEMOGLOBIN A1C
Hgb A1c MFr Bld: 6.5 % — ABNORMAL HIGH (ref 4.8–5.6)
Mean Plasma Glucose: 139.85 mg/dL

## 2023-02-04 LAB — SURGICAL PCR SCREEN
MRSA, PCR: NEGATIVE
Staphylococcus aureus: NEGATIVE

## 2023-02-04 LAB — GLUCOSE, CAPILLARY
Glucose-Capillary: 53 mg/dL — ABNORMAL LOW (ref 70–99)
Glucose-Capillary: 111 mg/dL — ABNORMAL HIGH (ref 70–99)

## 2023-02-04 NOTE — Progress Notes (Signed)
PCP - Dr. Martha Clan Cardiologist - Denies  PPM/ICD - Denies  Chest x-ray - N/A EKG - 06/27/22 Stress Test - Denies ECHO - 11/28/16 Cardiac Cath - Denies  Sleep Study - OSA CPAP - No  Fasting Blood Sugar - 95-110 Checks Blood Sugar __1__ time a day  Last dose of GLP1 agonist- Weeks ago. Per patient, PCP instructed her to stop Trulicity due to prescribed Aricept lower blood sugars. GLP1 instructions: Stop 7 days prior to surgery.  Blood Thinner Instructions: N/A Aspirin Instructions: LD: 02/01/23  ERAS Protcol - Yes PRE-SURGERY Ensure or G2- No  COVID TEST- N/A   Anesthesia review: Yes, previous EKG abnormal  Patient denies shortness of breath, fever, cough and chest pain at PAT appointment. Patient's CBG on arrival to PAT appt was 53. Patient stated that she felt "fine". No visible symptoms seen. Patient was provided with graham crackers and orange juice during appt. Recheck CBG after app and result was 111.   All instructions explained to the patient, with a verbal understanding of the material. Patient agrees to go over the instructions while at home for a better understanding. Patient also instructed to self quarantine after being tested for COVID-19. The opportunity to ask questions was provided.

## 2023-02-05 ENCOUNTER — Inpatient Hospital Stay: Admission: RE | Admit: 2023-02-05 | Payer: Medicare Other | Source: Ambulatory Visit

## 2023-02-05 NOTE — Anesthesia Preprocedure Evaluation (Addendum)
Anesthesia Evaluation  Patient identified by MRN, date of birth, ID band Patient awake    Reviewed: Allergy & Precautions, H&P , NPO status , Patient's Chart, lab work & pertinent test results  Airway Mallampati: II  TM Distance: >3 FB Neck ROM: Full    Dental no notable dental hx.    Pulmonary sleep apnea    Pulmonary exam normal breath sounds clear to auscultation       Cardiovascular hypertension, Normal cardiovascular exam Rhythm:Regular Rate:Normal     Neuro/Psych negative neurological ROS  negative psych ROS   GI/Hepatic Neg liver ROS,GERD  Medicated,,  Endo/Other  diabetes, Type 2    Renal/GU negative Renal ROS  negative genitourinary   Musculoskeletal negative musculoskeletal ROS (+)    Abdominal   Peds negative pediatric ROS (+)  Hematology negative hematology ROS (+)   Anesthesia Other Findings   Reproductive/Obstetrics negative OB ROS                             Anesthesia Physical Anesthesia Plan  ASA: 3  Anesthesia Plan: General   Post-op Pain Management: Minimal or no pain anticipated   Induction: Intravenous  PONV Risk Score and Plan: 2 and Ondansetron, Dexamethasone and Treatment may vary due to age or medical condition  Airway Management Planned: Oral ETT  Additional Equipment:   Intra-op Plan:   Post-operative Plan: Extubation in OR  Informed Consent: I have reviewed the patients History and Physical, chart, labs and discussed the procedure including the risks, benefits and alternatives for the proposed anesthesia with the patient or authorized representative who has indicated his/her understanding and acceptance.     Dental advisory given  Plan Discussed with: CRNA and Surgeon  Anesthesia Plan Comments: (Per PCP records from Dr. Cyndia Bent: Echocardiogram 11/28/16 showed mild concentric left ventricular hypertrophy with ejection fraction of 60-65%. There  appeared to be mild diastolic dysfunction. 48 hour Holter recording beginning 12/13/16 showed a total of 4 PVCs and 187 supraventricular ectopic beats. There were 13 runs of 3-10 sequential PACs with maximum heart rate 126. Cardiolite testing on 11/28/16 showed an ejection fraction of 68% with no evidence of ischemia.  )        Anesthesia Quick Evaluation

## 2023-02-08 ENCOUNTER — Other Ambulatory Visit: Payer: Self-pay

## 2023-02-08 ENCOUNTER — Encounter (HOSPITAL_COMMUNITY)
Admission: RE | Disposition: A | Discharge status: Home / Self Care | Payer: Self-pay | Source: Home / Self Care | Attending: Neurological Surgery

## 2023-02-08 ENCOUNTER — Encounter (HOSPITAL_COMMUNITY): Payer: Self-pay | Admitting: Neurological Surgery

## 2023-02-08 ENCOUNTER — Ambulatory Visit (HOSPITAL_COMMUNITY): Payer: Medicare Other

## 2023-02-08 ENCOUNTER — Ambulatory Visit (HOSPITAL_COMMUNITY): Payer: Medicare Other | Admitting: Physician Assistant

## 2023-02-08 ENCOUNTER — Ambulatory Visit (HOSPITAL_COMMUNITY)
Admission: RE | Admit: 2023-02-08 | Discharge: 2023-02-08 | Disposition: A | Discharge status: Home / Self Care | Payer: Medicare Other | Attending: Neurological Surgery | Admitting: Neurological Surgery

## 2023-02-08 ENCOUNTER — Ambulatory Visit (HOSPITAL_BASED_OUTPATIENT_CLINIC_OR_DEPARTMENT_OTHER): Payer: Medicare Other | Admitting: Anesthesiology

## 2023-02-08 DIAGNOSIS — G4730 Sleep apnea, unspecified: Secondary | ICD-10-CM | POA: Insufficient documentation

## 2023-02-08 DIAGNOSIS — W182XXA Fall in (into) shower or empty bathtub, initial encounter: Secondary | ICD-10-CM | POA: Diagnosis not present

## 2023-02-08 DIAGNOSIS — M8008XA Age-related osteoporosis with current pathological fracture, vertebra(e), initial encounter for fracture: Secondary | ICD-10-CM | POA: Diagnosis present

## 2023-02-08 DIAGNOSIS — S22080A Wedge compression fracture of T11-T12 vertebra, initial encounter for closed fracture: Secondary | ICD-10-CM | POA: Diagnosis not present

## 2023-02-08 DIAGNOSIS — E119 Type 2 diabetes mellitus without complications: Secondary | ICD-10-CM | POA: Insufficient documentation

## 2023-02-08 DIAGNOSIS — I10 Essential (primary) hypertension: Secondary | ICD-10-CM | POA: Diagnosis not present

## 2023-02-08 HISTORY — PX: KYPHOPLASTY: SHX5884

## 2023-02-08 LAB — GLUCOSE, CAPILLARY
Glucose-Capillary: 122 mg/dL — ABNORMAL HIGH (ref 70–99)
Glucose-Capillary: 132 mg/dL — ABNORMAL HIGH (ref 70–99)

## 2023-02-08 SURGERY — KYPHOPLASTY
Anesthesia: General | Site: Back

## 2023-02-08 MED ORDER — OXYCODONE HCL 5 MG PO TABS: 5.0000 mg | ORAL_TABLET | Freq: Once | ORAL | Status: DC | PRN

## 2023-02-08 MED ORDER — OXYCODONE HCL 5 MG/5ML PO SOLN: 5.0000 mg | Freq: Once | ORAL | Status: DC | PRN

## 2023-02-08 MED ORDER — PHENYLEPHRINE HCL-NACL 20-0.9 MG/250ML-% IV SOLN
INTRAVENOUS | Status: DC | PRN
  Administered 2023-02-08: 30 ug/min via INTRAVENOUS

## 2023-02-08 MED ORDER — DEXAMETHASONE SODIUM PHOSPHATE 10 MG/ML IJ SOLN
INTRAMUSCULAR | Status: DC | PRN
  Administered 2023-02-08: 10 mg via INTRAVENOUS

## 2023-02-08 MED ORDER — ORAL CARE MOUTH RINSE: 15.0000 mL | Freq: Once | OROMUCOSAL | Status: AC

## 2023-02-08 MED ORDER — ONDANSETRON HCL 4 MG/2ML IJ SOLN: 4.0000 mg | Freq: Once | INTRAMUSCULAR | Status: DC | PRN

## 2023-02-08 MED ORDER — LIDOCAINE 2% (20 MG/ML) 5 ML SYRINGE
INTRAMUSCULAR | Status: DC | PRN
  Administered 2023-02-08: 40 mg via INTRAVENOUS

## 2023-02-08 MED ORDER — FENTANYL CITRATE (PF) 100 MCG/2ML IJ SOLN
INTRAMUSCULAR | Status: AC
  Filled 2023-02-08: qty 2

## 2023-02-08 MED ORDER — HYDROCODONE-ACETAMINOPHEN 5-325 MG PO TABS: 1.0000 | ORAL_TABLET | ORAL | 0 refills | Status: DC | PRN

## 2023-02-08 MED ORDER — FENTANYL CITRATE (PF) 100 MCG/2ML IJ SOLN
25.0000 ug | INTRAMUSCULAR | Status: DC | PRN
  Administered 2023-02-08: 25 ug via INTRAVENOUS

## 2023-02-08 MED ORDER — CEFAZOLIN SODIUM-DEXTROSE 2-4 GM/100ML-% IV SOLN
2.0000 g | INTRAVENOUS | Status: AC
  Administered 2023-02-08: 2 g via INTRAVENOUS
  Filled 2023-02-08: qty 100

## 2023-02-08 MED ORDER — CHLORHEXIDINE GLUCONATE 0.12 % MT SOLN
15.0000 mL | Freq: Once | OROMUCOSAL | Status: AC
  Administered 2023-02-08: 15 mL via OROMUCOSAL
  Filled 2023-02-08: qty 15

## 2023-02-08 MED ORDER — ONDANSETRON HCL 4 MG/2ML IJ SOLN
INTRAMUSCULAR | Status: DC | PRN
  Administered 2023-02-08: 4 mg via INTRAVENOUS

## 2023-02-08 MED ORDER — LIDOCAINE-EPINEPHRINE 1 %-1:100000 IJ SOLN
INTRAMUSCULAR | Status: DC | PRN
  Administered 2023-02-08: 10 mL

## 2023-02-08 MED ORDER — IOHEXOL 300 MG/ML  SOLN
INTRAMUSCULAR | Status: DC | PRN
  Administered 2023-02-08: 100 mL

## 2023-02-08 MED ORDER — FENTANYL CITRATE (PF) 250 MCG/5ML IJ SOLN
INTRAMUSCULAR | Status: AC
  Filled 2023-02-08: qty 5

## 2023-02-08 MED ORDER — ACETAMINOPHEN 10 MG/ML IV SOLN
1000.0000 mg | Freq: Once | INTRAVENOUS | Status: DC | PRN
  Administered 2023-02-08: 1000 mg via INTRAVENOUS

## 2023-02-08 MED ORDER — LIDOCAINE-EPINEPHRINE 1 %-1:100000 IJ SOLN
INTRAMUSCULAR | Status: AC
  Filled 2023-02-08: qty 1

## 2023-02-08 MED ORDER — FENTANYL CITRATE (PF) 250 MCG/5ML IJ SOLN
INTRAMUSCULAR | Status: DC | PRN
  Administered 2023-02-08 (×2): 25 ug via INTRAVENOUS

## 2023-02-08 MED ORDER — 0.9 % SODIUM CHLORIDE (POUR BTL) OPTIME
TOPICAL | Status: DC | PRN
  Administered 2023-02-08 (×2): 1000 mL

## 2023-02-08 MED ORDER — PROPOFOL 10 MG/ML IV BOLUS
INTRAVENOUS | Status: DC | PRN
  Administered 2023-02-08: 110 mg via INTRAVENOUS

## 2023-02-08 MED ORDER — CHLORHEXIDINE GLUCONATE CLOTH 2 % EX PADS: 6.0000 | MEDICATED_PAD | Freq: Once | CUTANEOUS | Status: DC

## 2023-02-08 MED ORDER — PROPOFOL 10 MG/ML IV BOLUS
INTRAVENOUS | Status: AC
  Filled 2023-02-08: qty 20

## 2023-02-08 MED ORDER — LACTATED RINGERS IV SOLN: INTRAVENOUS | Status: DC

## 2023-02-08 MED ORDER — ACETAMINOPHEN 10 MG/ML IV SOLN
INTRAVENOUS | Status: AC
  Filled 2023-02-08: qty 100

## 2023-02-08 MED ORDER — SUGAMMADEX SODIUM 200 MG/2ML IV SOLN
INTRAVENOUS | Status: DC | PRN
  Administered 2023-02-08: 110 mg via INTRAVENOUS

## 2023-02-08 MED ORDER — ROCURONIUM BROMIDE 10 MG/ML (PF) SYRINGE
PREFILLED_SYRINGE | INTRAVENOUS | Status: DC | PRN
  Administered 2023-02-08: 50 mg via INTRAVENOUS

## 2023-02-08 SURGICAL SUPPLY — 48 items
ADH SKN CLS APL DERMABOND .7 (GAUZE/BANDAGES/DRESSINGS) ×1
BAG COUNTER SPONGE SURGICOUNT (BAG) ×1 IMPLANT
BAG SPNG CNTER NS LX DISP (BAG) ×1
BLADE CLIPPER SURG (BLADE) IMPLANT
BLADE SURG 15 STRL LF DISP TIS (BLADE) ×1 IMPLANT
BLADE SURG 15 STRL SS (BLADE)
CANISTER SUCT 3000ML PPV (MISCELLANEOUS) ×1 IMPLANT
CEMENT KYPHON C01A KIT/MIXER (Cement) IMPLANT
CURETTE EXPRESS SZ2 7MM (INSTRUMENTS) IMPLANT
CURRETTE EXPRESS SZ2 7MM (INSTRUMENTS) ×1
DERMABOND ADVANCED .7 DNX12 (GAUZE/BANDAGES/DRESSINGS) ×1 IMPLANT
DRAPE C-ARM 42X72 X-RAY (DRAPES) ×2 IMPLANT
DRAPE LAPAROTOMY 100X72X124 (DRAPES) ×1 IMPLANT
DRAPE SURG 17X23 STRL (DRAPES) ×1 IMPLANT
DRAPE WARM FLUID 44X44 (DRAPES) ×1 IMPLANT
DURAPREP 26ML APPLICATOR (WOUND CARE) ×1 IMPLANT
GAUZE 4X4 16PLY ~~LOC~~+RFID DBL (SPONGE) IMPLANT
GAUZE SPONGE 4X4 12PLY STRL (GAUZE/BANDAGES/DRESSINGS) IMPLANT
GLOVE BIOGEL PI IND STRL 7.5 (GLOVE) ×1 IMPLANT
GLOVE EXAM NITRILE LRG STRL (GLOVE) IMPLANT
GLOVE EXAM NITRILE XL STR (GLOVE) IMPLANT
GLOVE EXAM NITRILE XS STR PU (GLOVE) IMPLANT
GOWN STRL REUS W/ TWL LRG LVL3 (GOWN DISPOSABLE) ×2 IMPLANT
GOWN STRL REUS W/ TWL XL LVL3 (GOWN DISPOSABLE) IMPLANT
GOWN STRL REUS W/TWL 2XL LVL3 (GOWN DISPOSABLE) IMPLANT
GOWN STRL REUS W/TWL LRG LVL3 (GOWN DISPOSABLE) ×2
GOWN STRL REUS W/TWL XL LVL3 (GOWN DISPOSABLE)
KIT BASIN OR (CUSTOM PROCEDURE TRAY) ×1 IMPLANT
KIT TURNOVER KIT B (KITS) ×1 IMPLANT
NDL SPNL 18GX3.5 QUINCKE PK (NEEDLE) ×1 IMPLANT
NEEDLE HYPO 22GX1.5 SAFETY (NEEDLE) ×1 IMPLANT
NEEDLE SPNL 18GX3.5 QUINCKE PK (NEEDLE) ×1 IMPLANT
NS IRRIG 1000ML POUR BTL (IV SOLUTION) ×1 IMPLANT
PACK BASIC III (CUSTOM PROCEDURE TRAY) ×1
PACK SRG BSC III STRL LF ECLPS (CUSTOM PROCEDURE TRAY) ×1 IMPLANT
PAD ARMBOARD 7.5X6 YLW CONV (MISCELLANEOUS) ×3 IMPLANT
SOL ELECTROSURG ANTI STICK (MISCELLANEOUS) ×1
SOLUTION ELECTROSURG ANTI STCK (MISCELLANEOUS) ×1 IMPLANT
SPIKE FLUID TRANSFER (MISCELLANEOUS) ×1 IMPLANT
SPONGE T-LAP 4X18 ~~LOC~~+RFID (SPONGE) IMPLANT
SUT MNCRL AB 3-0 PS2 18 (SUTURE) IMPLANT
SUT VIC AB 2-0 SH 27 (SUTURE) ×1
SUT VIC AB 2-0 SH 27XBRD (SUTURE) IMPLANT
SUT VIC AB 3-0 SH 8-18 (SUTURE) IMPLANT
TOWEL GREEN STERILE (TOWEL DISPOSABLE) ×1 IMPLANT
TOWEL GREEN STERILE FF (TOWEL DISPOSABLE) ×1 IMPLANT
TRAY KYPHOPAK 15/3 ONESTEP 1ST (MISCELLANEOUS) IMPLANT
WATER STERILE IRR 1000ML POUR (IV SOLUTION) ×1 IMPLANT

## 2023-02-08 NOTE — H&P (Signed)
Surgical H&P Update  HPI: 87 y.o. with a history of severe back pain after a fall in a bath tub. Workup showed T11 and L1 compression fractures. No changes in health since they were last seen. Still miserable with back pain today and wishes to proceed with surgery.  PMHx:  Past Medical History:  Diagnosis Date   Arthritis    WRIST/ HANDS   Asymptomatic vertebral artery stenosis    RIGHT SIDE   Diverticulitis 11-30-13   during Christmas holiday- resolved   GERD (gastroesophageal reflux disease)    Hyperlipidemia    Hypertension    no meds   Leg cramps    OCCASIONAL   Sleep apnea 11-30-13   mild-no cpap.   Type 2 diabetes mellitus (HCC)    Urge urinary incontinence    FamHx:  Family History  Problem Relation Age of Onset   Diabetes Mother    Alzheimer's disease Mother    Dementia Mother    SocHx:  reports that she has never smoked. She has never used smokeless tobacco. She reports that she does not drink alcohol and does not use drugs.  Physical Exam: Strength 5/5 x4 and SILTx4   Assesment/Plan: 87 y.o. woman with T11 L1 cx fx, here for kyphoplasty x2. Risks, benefits, and alternatives discussed and the patient would like to continue with surgery.  -OR today -likely home from PACU post-op  Jadene Pierini, MD 02/08/23 7:30 AM

## 2023-02-08 NOTE — Op Note (Signed)
PATIENT: Savannah Harris  DAY OF SURGERY: 02/08/23   PRE-OPERATIVE DIAGNOSIS:  T11, L1 age related osteoporosis with pathologic fracture   POST-OPERATIVE DIAGNOSIS:  Same   PROCEDURE:  T11, L1 kyphoplasty   SURGEON:  Surgeon(s) and Role:    Artemio Dobie, Clovis Pu, MD    Emilee Hero PA   ANESTHESIA: ETGA   BRIEF HISTORY: This is an 87 year old woman who presented with severe back pain after a fall in the tub. Workup showed a pincer fracture through the sclerotic bone at L1 and a T11 compression fracture, her pain was refractory, I therefore recommended kyphoplasty at those levels.    OPERATIVE DETAIL: The patient was taken to the operating room, a formal time out was performed with two patient identifiers, anesthesia was induced by the anesthesia team, and the patient was placed on the OR table in the prone position. Fluoroscopy was used to localize the operative level and bi-plane fluoroscopy was used for the entirety of the procedure. The area was then prepped and draped in a sterile fashion.   Using fluoroscopic guidance, entry sites were identified and a small stab incision was made large enough to accommodate the needles. The needles were then inserted percutaneously into the pedicles of T11 and L1 bilaterally and into the vertebral body. The inner cannula was removed and balloons were inserted and expanded using contrast for visualization. At L1, as expected, the sclerotic bone did not yield much. I therefore introduced a curette through the cannula and expanded the void to allow for more effective cement placement. The balloons were replaced and re-expanded. The balloons were then deflated and removed and cement was inserted through the cannula with fluoroscopic monitoring for any leakage out of the vertebral body. At L1, there was immediate leakage into the inferior disc space, the cannula was withdrawn and I was able to get cement into the fracture plane. At T11, there was an  improvement in the loss of height and good filling. The inner cannulas were then reinserted and the needles were removed. All instrument and sponge counts were correct, dermabond was placed over the puncture sites bilaterally. The patient was then returned to anesthesia for emergence. No apparent complications at the completion of the procedure.  [name] PA was scrubbed and assisted with the entire procedure which included set up of the biplanar fluoroscopy, prep and draping, mixing of cement and handling instruments, assisting with placement of instrumentation, and closure.   EBL:  5mL   DRAINS: none   SPECIMENS: none   Jadene Pierini, MD 02/08/23 7:58 AM

## 2023-02-08 NOTE — Anesthesia Procedure Notes (Signed)
Procedure Name: Intubation Date/Time: 02/08/2023 7:50 AM  Performed by: Macie Burows, CRNAPre-anesthesia Checklist: Patient identified, Emergency Drugs available, Suction available and Patient being monitored Patient Re-evaluated:Patient Re-evaluated prior to induction Oxygen Delivery Method: Circle system utilized Preoxygenation: Pre-oxygenation with 100% oxygen Induction Type: IV induction Ventilation: Mask ventilation without difficulty Laryngoscope Size: Mac and 3 Grade View: Grade I Tube type: Oral Tube size: 7.0 mm Number of attempts: 1 Airway Equipment and Method: Stylet and Oral airway Placement Confirmation: ETT inserted through vocal cords under direct vision, positive ETCO2 and breath sounds checked- equal and bilateral Secured at: 21 cm Tube secured with: Tape Dental Injury: Teeth and Oropharynx as per pre-operative assessment

## 2023-02-08 NOTE — Anesthesia Postprocedure Evaluation (Signed)
Anesthesia Post Note  Patient: Savannah Harris  Procedure(s) Performed: T11,L1 KYPHOPLASTY (Back)     Patient location during evaluation: PACU Anesthesia Type: General Level of consciousness: awake and alert Pain management: pain level controlled Vital Signs Assessment: post-procedure vital signs reviewed and stable Respiratory status: spontaneous breathing, nonlabored ventilation, respiratory function stable and patient connected to nasal cannula oxygen Cardiovascular status: blood pressure returned to baseline and stable Postop Assessment: no apparent nausea or vomiting Anesthetic complications: no  No notable events documented.  Last Vitals:  Vitals:   02/08/23 0945 02/08/23 1000  BP: 136/65 (!) 130/58  Pulse: 69 68  Resp: 12 19  Temp:    SpO2: 98% 95%    Last Pain:  Vitals:   02/08/23 0945  TempSrc:   PainSc: 8                  Maikayla Beggs S

## 2023-02-08 NOTE — Transfer of Care (Signed)
Immediate Anesthesia Transfer of Care Note  Patient: Savannah Harris  Procedure(s) Performed: T11,L1 KYPHOPLASTY (Back)  Patient Location: PACU  Anesthesia Type:General  Level of Consciousness: awake, alert , and oriented  Airway & Oxygen Therapy: Patient Spontanous Breathing  Post-op Assessment: Report given to RN and Post -op Vital signs reviewed and stable  Post vital signs: Reviewed and stable  Last Vitals:  Vitals Value Taken Time  BP 159/71 02/08/23 0926  Temp    Pulse 78 02/08/23 0928  Resp 19 02/08/23 0928  SpO2 94 % 02/08/23 0928  Vitals shown include unvalidated device data.  Last Pain:  Vitals:   02/08/23 0655  TempSrc:   PainSc: 0-No pain      Patients Stated Pain Goal: 4 (02/08/23 8657)  Complications: No notable events documented.

## 2023-02-09 ENCOUNTER — Encounter (HOSPITAL_COMMUNITY): Payer: Self-pay | Admitting: Neurological Surgery

## 2023-02-21 ENCOUNTER — Other Ambulatory Visit (HOSPITAL_COMMUNITY): Payer: Self-pay | Admitting: Neurological Surgery

## 2023-02-21 DIAGNOSIS — M8008XA Age-related osteoporosis with current pathological fracture, vertebra(e), initial encounter for fracture: Secondary | ICD-10-CM

## 2023-03-01 ENCOUNTER — Encounter (HOSPITAL_BASED_OUTPATIENT_CLINIC_OR_DEPARTMENT_OTHER): Payer: Self-pay

## 2023-03-01 ENCOUNTER — Ambulatory Visit (HOSPITAL_BASED_OUTPATIENT_CLINIC_OR_DEPARTMENT_OTHER)
Admission: RE | Admit: 2023-03-01 | Discharge: 2023-03-01 | Disposition: A | Discharge status: Home / Self Care | Payer: Medicare Other | Source: Ambulatory Visit | Attending: Neurological Surgery | Admitting: Neurological Surgery

## 2023-03-01 ENCOUNTER — Ambulatory Visit (HOSPITAL_BASED_OUTPATIENT_CLINIC_OR_DEPARTMENT_OTHER): Payer: Medicare Other

## 2023-03-01 DIAGNOSIS — S72002A Fracture of unspecified part of neck of left femur, initial encounter for closed fracture: Secondary | ICD-10-CM | POA: Diagnosis not present

## 2023-03-01 DIAGNOSIS — M8008XA Age-related osteoporosis with current pathological fracture, vertebra(e), initial encounter for fracture: Secondary | ICD-10-CM | POA: Insufficient documentation

## 2023-03-03 ENCOUNTER — Emergency Department (HOSPITAL_COMMUNITY): Payer: Medicare Other

## 2023-03-03 ENCOUNTER — Inpatient Hospital Stay (HOSPITAL_COMMUNITY)
Admission: EM | Admit: 2023-03-03 | Discharge: 2023-03-06 | DRG: 522 | Disposition: A | Discharge status: Skilled Nursing Facility | Payer: Medicare Other | Attending: Family Medicine | Admitting: Family Medicine

## 2023-03-03 ENCOUNTER — Inpatient Hospital Stay (HOSPITAL_COMMUNITY): Payer: Medicare Other | Admitting: Anesthesiology

## 2023-03-03 ENCOUNTER — Inpatient Hospital Stay (HOSPITAL_COMMUNITY): Payer: Medicare Other

## 2023-03-03 ENCOUNTER — Other Ambulatory Visit: Payer: Self-pay

## 2023-03-03 ENCOUNTER — Encounter (HOSPITAL_COMMUNITY)
Admission: EM | Disposition: A | Discharge status: Skilled Nursing Facility | Payer: Self-pay | Source: Home / Self Care | Attending: Internal Medicine

## 2023-03-03 ENCOUNTER — Encounter (HOSPITAL_COMMUNITY): Payer: Self-pay

## 2023-03-03 DIAGNOSIS — Z91040 Latex allergy status: Secondary | ICD-10-CM

## 2023-03-03 DIAGNOSIS — E119 Type 2 diabetes mellitus without complications: Secondary | ICD-10-CM

## 2023-03-03 DIAGNOSIS — I10 Essential (primary) hypertension: Secondary | ICD-10-CM | POA: Diagnosis present

## 2023-03-03 DIAGNOSIS — F0280 Dementia in other diseases classified elsewhere without behavioral disturbance: Secondary | ICD-10-CM | POA: Diagnosis present

## 2023-03-03 DIAGNOSIS — Z9842 Cataract extraction status, left eye: Secondary | ICD-10-CM

## 2023-03-03 DIAGNOSIS — Z91048 Other nonmedicinal substance allergy status: Secondary | ICD-10-CM

## 2023-03-03 DIAGNOSIS — E118 Type 2 diabetes mellitus with unspecified complications: Secondary | ICD-10-CM | POA: Diagnosis present

## 2023-03-03 DIAGNOSIS — Z885 Allergy status to narcotic agent status: Secondary | ICD-10-CM

## 2023-03-03 DIAGNOSIS — Z79899 Other long term (current) drug therapy: Secondary | ICD-10-CM | POA: Diagnosis not present

## 2023-03-03 DIAGNOSIS — E785 Hyperlipidemia, unspecified: Secondary | ICD-10-CM | POA: Diagnosis present

## 2023-03-03 DIAGNOSIS — Z90710 Acquired absence of both cervix and uterus: Secondary | ICD-10-CM | POA: Diagnosis not present

## 2023-03-03 DIAGNOSIS — Z882 Allergy status to sulfonamides status: Secondary | ICD-10-CM | POA: Diagnosis not present

## 2023-03-03 DIAGNOSIS — S72002A Fracture of unspecified part of neck of left femur, initial encounter for closed fracture: Secondary | ICD-10-CM

## 2023-03-03 DIAGNOSIS — Y92009 Unspecified place in unspecified non-institutional (private) residence as the place of occurrence of the external cause: Secondary | ICD-10-CM | POA: Diagnosis not present

## 2023-03-03 DIAGNOSIS — Z9181 History of falling: Secondary | ICD-10-CM

## 2023-03-03 DIAGNOSIS — Z96641 Presence of right artificial hip joint: Secondary | ICD-10-CM | POA: Diagnosis present

## 2023-03-03 DIAGNOSIS — Z961 Presence of intraocular lens: Secondary | ICD-10-CM | POA: Diagnosis present

## 2023-03-03 DIAGNOSIS — Z888 Allergy status to other drugs, medicaments and biological substances status: Secondary | ICD-10-CM

## 2023-03-03 DIAGNOSIS — Z9841 Cataract extraction status, right eye: Secondary | ICD-10-CM

## 2023-03-03 DIAGNOSIS — Z7984 Long term (current) use of oral hypoglycemic drugs: Secondary | ICD-10-CM

## 2023-03-03 DIAGNOSIS — W1830XA Fall on same level, unspecified, initial encounter: Secondary | ICD-10-CM | POA: Diagnosis present

## 2023-03-03 DIAGNOSIS — Z833 Family history of diabetes mellitus: Secondary | ICD-10-CM | POA: Diagnosis not present

## 2023-03-03 DIAGNOSIS — G309 Alzheimer's disease, unspecified: Secondary | ICD-10-CM | POA: Diagnosis present

## 2023-03-03 DIAGNOSIS — M4854XA Collapsed vertebra, not elsewhere classified, thoracic region, initial encounter for fracture: Secondary | ICD-10-CM | POA: Diagnosis present

## 2023-03-03 DIAGNOSIS — Z7982 Long term (current) use of aspirin: Secondary | ICD-10-CM

## 2023-03-03 DIAGNOSIS — K219 Gastro-esophageal reflux disease without esophagitis: Secondary | ICD-10-CM | POA: Diagnosis present

## 2023-03-03 DIAGNOSIS — G4730 Sleep apnea, unspecified: Secondary | ICD-10-CM | POA: Diagnosis present

## 2023-03-03 DIAGNOSIS — S22000A Wedge compression fracture of unspecified thoracic vertebra, initial encounter for closed fracture: Secondary | ICD-10-CM

## 2023-03-03 DIAGNOSIS — Z9049 Acquired absence of other specified parts of digestive tract: Secondary | ICD-10-CM

## 2023-03-03 DIAGNOSIS — Z820 Family history of epilepsy and other diseases of the nervous system: Secondary | ICD-10-CM

## 2023-03-03 HISTORY — PX: HIP ARTHROPLASTY: SHX981

## 2023-03-03 LAB — BASIC METABOLIC PANEL
Anion gap: 12 (ref 5–15)
BUN: 22 mg/dL (ref 8–23)
CO2: 20 mmol/L — ABNORMAL LOW (ref 22–32)
Calcium: 8.7 mg/dL — ABNORMAL LOW (ref 8.9–10.3)
Chloride: 110 mmol/L (ref 98–111)
Creatinine, Ser: 0.81 mg/dL (ref 0.44–1.00)
GFR, Estimated: >60 mL/min (ref >60)
Glucose, Bld: 140 mg/dL — ABNORMAL HIGH (ref 70–99)
Potassium: 3.8 mmol/L (ref 3.5–5.1)
Sodium: 142 mmol/L (ref 135–145)

## 2023-03-03 LAB — CBC WITH DIFFERENTIAL/PLATELET
Abs Immature Granulocytes: 0.06 10*3/uL (ref 0.00–0.07)
Basophils Absolute: 0 10*3/uL (ref 0.0–0.1)
Basophils Relative: 0 %
Eosinophils Absolute: 0 10*3/uL (ref 0.0–0.5)
Eosinophils Relative: 1 %
HCT: 37 % (ref 36.0–46.0)
Hemoglobin: 11.7 g/dL — ABNORMAL LOW (ref 12.0–15.0)
Immature Granulocytes: 1 %
Lymphocytes Relative: 12 %
Lymphs Abs: 0.8 10*3/uL (ref 0.7–4.0)
MCH: 28.5 pg (ref 26.0–34.0)
MCHC: 31.6 g/dL (ref 30.0–36.0)
MCV: 90.2 fL (ref 80.0–100.0)
Monocytes Absolute: 0.4 10*3/uL (ref 0.1–1.0)
Monocytes Relative: 6 %
Neutro Abs: 5.5 10*3/uL (ref 1.7–7.7)
Neutrophils Relative %: 80 %
Platelets: 159 10*3/uL (ref 150–400)
RBC: 4.1 MIL/uL (ref 3.87–5.11)
RDW: 13.7 % (ref 11.5–15.5)
WBC: 6.8 10*3/uL (ref 4.0–10.5)
nRBC: 0 % (ref 0.0–0.2)

## 2023-03-03 LAB — HEPATIC FUNCTION PANEL
ALT: 20 U/L (ref 0–44)
AST: 27 U/L (ref 15–41)
Albumin: 3.6 g/dL (ref 3.5–5.0)
Alkaline Phosphatase: 103 U/L (ref 38–126)
Bilirubin, Direct: 0.1 mg/dL (ref 0.0–0.2)
Indirect Bilirubin: 0.7 mg/dL (ref 0.3–0.9)
Total Bilirubin: 0.8 mg/dL (ref 0.3–1.2)
Total Protein: 6.3 g/dL — ABNORMAL LOW (ref 6.5–8.1)

## 2023-03-03 LAB — GLUCOSE, CAPILLARY
Glucose-Capillary: 137 mg/dL — ABNORMAL HIGH (ref 70–99)
Glucose-Capillary: 138 mg/dL — ABNORMAL HIGH (ref 70–99)
Glucose-Capillary: 299 mg/dL — ABNORMAL HIGH (ref 70–99)
Glucose-Capillary: 182 mg/dL — ABNORMAL HIGH (ref 70–99)

## 2023-03-03 LAB — TYPE AND SCREEN
ABO/RH(D): B POS
Antibody Screen: NEGATIVE

## 2023-03-03 LAB — VITAMIN D 25 HYDROXY (VIT D DEFICIENCY, FRACTURES): Vit D, 25-Hydroxy: 80.64 ng/mL (ref 30–100)

## 2023-03-03 LAB — PROTIME-INR
INR: 1.1 (ref 0.8–1.2)
Prothrombin Time: 14.5 seconds (ref 11.4–15.2)

## 2023-03-03 LAB — SURGICAL PCR SCREEN
MRSA, PCR: NEGATIVE
Staphylococcus aureus: NEGATIVE

## 2023-03-03 SURGERY — HEMIARTHROPLASTY, HIP, DIRECT ANTERIOR APPROACH, FOR FRACTURE
Anesthesia: General | Site: Hip | Laterality: Left

## 2023-03-03 MED ORDER — OXYCODONE HCL 5 MG PO TABS
2.5000 mg | ORAL_TABLET | ORAL | Status: DC | PRN
  Administered 2023-03-03 – 2023-03-06 (×11): 5 mg via ORAL
  Filled 2023-03-03 (×11): qty 1

## 2023-03-03 MED ORDER — ROSUVASTATIN CALCIUM 5 MG PO TABS
10.0000 mg | ORAL_TABLET | Freq: Every day | ORAL | Status: DC
  Administered 2023-03-03 – 2023-03-05 (×3): 10 mg via ORAL
  Filled 2023-03-03 (×3): qty 2

## 2023-03-03 MED ORDER — SERTRALINE HCL 50 MG PO TABS
50.0000 mg | ORAL_TABLET | Freq: Every day | ORAL | Status: DC
  Administered 2023-03-03 – 2023-03-06 (×4): 50 mg via ORAL
  Filled 2023-03-03 (×4): qty 1

## 2023-03-03 MED ORDER — SODIUM CHLORIDE (PF) 0.9 % IJ SOLN
INTRAMUSCULAR | Status: AC
  Filled 2023-03-03: qty 50

## 2023-03-03 MED ORDER — TRANEXAMIC ACID-NACL 1000-0.7 MG/100ML-% IV SOLN
INTRAVENOUS | Status: AC
  Filled 2023-03-03: qty 100

## 2023-03-03 MED ORDER — LIDOCAINE 2% (20 MG/ML) 5 ML SYRINGE
INTRAMUSCULAR | Status: AC
  Filled 2023-03-03: qty 5

## 2023-03-03 MED ORDER — SODIUM CHLORIDE 0.9 % IV SOLN: INTRAVENOUS | Status: DC | PRN

## 2023-03-03 MED ORDER — ONDANSETRON HCL 4 MG/2ML IJ SOLN
INTRAMUSCULAR | Status: DC | PRN
  Administered 2023-03-03: 4 mg via INTRAVENOUS

## 2023-03-03 MED ORDER — DEXAMETHASONE SODIUM PHOSPHATE 10 MG/ML IJ SOLN
INTRAMUSCULAR | Status: AC
  Filled 2023-03-03: qty 1

## 2023-03-03 MED ORDER — CHLORHEXIDINE GLUCONATE 0.12 % MT SOLN
OROMUCOSAL | Status: AC
  Administered 2023-03-03: 15 mL via OROMUCOSAL
  Filled 2023-03-03: qty 15

## 2023-03-03 MED ORDER — BUPIVACAINE LIPOSOME 1.3 % IJ SUSP
INTRAMUSCULAR | Status: AC
  Filled 2023-03-03: qty 20

## 2023-03-03 MED ORDER — SODIUM CHLORIDE 0.9 % IR SOLN
Status: DC | PRN
  Administered 2023-03-03: 3000 mL

## 2023-03-03 MED ORDER — ONDANSETRON HCL 4 MG/2ML IJ SOLN: 4.0000 mg | Freq: Four times a day (QID) | INTRAMUSCULAR | Status: DC | PRN

## 2023-03-03 MED ORDER — ONDANSETRON HCL 4 MG/2ML IJ SOLN
4.0000 mg | Freq: Once | INTRAMUSCULAR | Status: AC
  Administered 2023-03-03: 4 mg via INTRAVENOUS
  Filled 2023-03-03: qty 2

## 2023-03-03 MED ORDER — HYDROCODONE-ACETAMINOPHEN 5-325 MG PO TABS: 1.0000 | ORAL_TABLET | Freq: Four times a day (QID) | ORAL | Status: DC | PRN

## 2023-03-03 MED ORDER — FENTANYL CITRATE (PF) 250 MCG/5ML IJ SOLN
INTRAMUSCULAR | Status: DC | PRN
  Administered 2023-03-03 (×3): 50 ug via INTRAVENOUS

## 2023-03-03 MED ORDER — FENTANYL CITRATE (PF) 100 MCG/2ML IJ SOLN: 25.0000 ug | INTRAMUSCULAR | Status: DC | PRN

## 2023-03-03 MED ORDER — CEFAZOLIN SODIUM-DEXTROSE 2-4 GM/100ML-% IV SOLN
2.0000 g | Freq: Three times a day (TID) | INTRAVENOUS | Status: AC
  Administered 2023-03-03 (×2): 2 g via INTRAVENOUS
  Filled 2023-03-03 (×2): qty 100

## 2023-03-03 MED ORDER — METOPROLOL TARTRATE 5 MG/5ML IV SOLN
5.0000 mg | Freq: Three times a day (TID) | INTRAVENOUS | Status: DC | PRN
  Filled 2023-03-03: qty 5

## 2023-03-03 MED ORDER — CEFAZOLIN SODIUM-DEXTROSE 2-4 GM/100ML-% IV SOLN
2.0000 g | INTRAVENOUS | Status: AC
  Administered 2023-03-03: 2 g via INTRAVENOUS

## 2023-03-03 MED ORDER — LIDOCAINE 2% (20 MG/ML) 5 ML SYRINGE
INTRAMUSCULAR | Status: DC | PRN
  Administered 2023-03-03: 40 mg via INTRAVENOUS

## 2023-03-03 MED ORDER — SODIUM CHLORIDE (PF) 0.9 % IJ SOLN
INTRAMUSCULAR | Status: DC | PRN
  Administered 2023-03-03: 250 mL

## 2023-03-03 MED ORDER — PROPOFOL 10 MG/ML IV BOLUS
INTRAVENOUS | Status: AC
  Filled 2023-03-03: qty 20

## 2023-03-03 MED ORDER — ORAL CARE MOUTH RINSE: 15.0000 mL | Freq: Once | OROMUCOSAL | Status: AC

## 2023-03-03 MED ORDER — PROPOFOL 10 MG/ML IV BOLUS
INTRAVENOUS | Status: DC | PRN
  Administered 2023-03-03: 80 mg via INTRAVENOUS

## 2023-03-03 MED ORDER — CYCLOSPORINE 0.05 % OP EMUL
1.0000 [drp] | Freq: Two times a day (BID) | OPHTHALMIC | Status: DC
  Administered 2023-03-03 – 2023-03-06 (×6): 1 [drp] via OPHTHALMIC
  Filled 2023-03-03 (×7): qty 30

## 2023-03-03 MED ORDER — LACTATED RINGERS IV SOLN: INTRAVENOUS | Status: DC

## 2023-03-03 MED ORDER — DEXAMETHASONE SODIUM PHOSPHATE 10 MG/ML IJ SOLN: 8.0000 mg | Freq: Once | INTRAMUSCULAR | Status: DC

## 2023-03-03 MED ORDER — INSULIN ASPART 100 UNIT/ML IJ SOLN: 0.0000 [IU] | INTRAMUSCULAR | Status: DC | PRN

## 2023-03-03 MED ORDER — EPHEDRINE 5 MG/ML INJ
INTRAVENOUS | Status: AC
  Filled 2023-03-03: qty 5

## 2023-03-03 MED ORDER — POVIDONE-IODINE 10 % EX SWAB
2.0000 | Freq: Once | CUTANEOUS | Status: AC
  Administered 2023-03-03: 2 via TOPICAL

## 2023-03-03 MED ORDER — PHENYLEPHRINE 80 MCG/ML (10ML) SYRINGE FOR IV PUSH (FOR BLOOD PRESSURE SUPPORT)
PREFILLED_SYRINGE | INTRAVENOUS | Status: AC
  Filled 2023-03-03: qty 10

## 2023-03-03 MED ORDER — BUPIVACAINE LIPOSOME 1.3 % IJ SUSP: 10.0000 mL | Freq: Once | INTRAMUSCULAR | Status: DC

## 2023-03-03 MED ORDER — SUGAMMADEX SODIUM 200 MG/2ML IV SOLN
INTRAVENOUS | Status: DC | PRN
  Administered 2023-03-03: 200 mg via INTRAVENOUS

## 2023-03-03 MED ORDER — ONDANSETRON HCL 4 MG/2ML IJ SOLN
INTRAMUSCULAR | Status: AC
  Filled 2023-03-03: qty 2

## 2023-03-03 MED ORDER — ASPIRIN 81 MG PO TBEC
81.0000 mg | DELAYED_RELEASE_TABLET | Freq: Two times a day (BID) | ORAL | Status: DC
  Administered 2023-03-04 – 2023-03-06 (×5): 81 mg via ORAL
  Filled 2023-03-03 (×5): qty 1

## 2023-03-03 MED ORDER — MORPHINE SULFATE (PF) 4 MG/ML IV SOLN
4.0000 mg | Freq: Once | INTRAVENOUS | Status: AC
  Administered 2023-03-03: 4 mg via INTRAVENOUS
  Filled 2023-03-03: qty 1

## 2023-03-03 MED ORDER — ACETAMINOPHEN 500 MG PO TABS: 1000.0000 mg | ORAL_TABLET | Freq: Once | ORAL | Status: AC

## 2023-03-03 MED ORDER — ROCURONIUM BROMIDE 10 MG/ML (PF) SYRINGE
PREFILLED_SYRINGE | INTRAVENOUS | Status: AC
  Filled 2023-03-03: qty 10

## 2023-03-03 MED ORDER — INSULIN ASPART 100 UNIT/ML IJ SOLN
0.0000 [IU] | Freq: Three times a day (TID) | INTRAMUSCULAR | Status: DC
  Administered 2023-03-03: 5 [IU] via SUBCUTANEOUS
  Administered 2023-03-04 (×2): 1 [IU] via SUBCUTANEOUS
  Administered 2023-03-04: 2 [IU] via SUBCUTANEOUS
  Administered 2023-03-05 (×2): 3 [IU] via SUBCUTANEOUS
  Administered 2023-03-05 – 2023-03-06 (×2): 2 [IU] via SUBCUTANEOUS
  Administered 2023-03-06: 3 [IU] via SUBCUTANEOUS
  Administered 2023-03-06: 1 [IU] via SUBCUTANEOUS

## 2023-03-03 MED ORDER — DONEPEZIL HCL 10 MG PO TABS
10.0000 mg | ORAL_TABLET | Freq: Every day | ORAL | Status: DC
  Administered 2023-03-03 – 2023-03-05 (×3): 10 mg via ORAL
  Filled 2023-03-03 (×3): qty 1

## 2023-03-03 MED ORDER — SODIUM CHLORIDE 0.9 % IV SOLN: INTRAVENOUS | Status: DC

## 2023-03-03 MED ORDER — ROCURONIUM BROMIDE 10 MG/ML (PF) SYRINGE
PREFILLED_SYRINGE | INTRAVENOUS | Status: DC | PRN
  Administered 2023-03-03: 20 mg via INTRAVENOUS
  Administered 2023-03-03: 50 mg via INTRAVENOUS

## 2023-03-03 MED ORDER — PHENYLEPHRINE 80 MCG/ML (10ML) SYRINGE FOR IV PUSH (FOR BLOOD PRESSURE SUPPORT)
PREFILLED_SYRINGE | INTRAVENOUS | Status: DC | PRN
  Administered 2023-03-03 (×2): 160 ug via INTRAVENOUS

## 2023-03-03 MED ORDER — VASOPRESSIN 20 UNIT/ML IV SOLN
INTRAVENOUS | Status: DC | PRN
  Administered 2023-03-03: 1 [IU] via INTRAVENOUS

## 2023-03-03 MED ORDER — ALBUMIN HUMAN 5 % IV SOLN: INTRAVENOUS | Status: DC | PRN

## 2023-03-03 MED ORDER — ACETAMINOPHEN 500 MG PO TABS
ORAL_TABLET | ORAL | Status: AC
  Administered 2023-03-03: 1000 mg via ORAL
  Filled 2023-03-03: qty 2

## 2023-03-03 MED ORDER — MORPHINE SULFATE (PF) 2 MG/ML IV SOLN
0.5000 mg | INTRAVENOUS | Status: DC | PRN
  Administered 2023-03-04: 0.5 mg via INTRAVENOUS
  Filled 2023-03-03: qty 1

## 2023-03-03 MED ORDER — EPHEDRINE SULFATE-NACL 50-0.9 MG/10ML-% IV SOSY
PREFILLED_SYRINGE | INTRAVENOUS | Status: DC | PRN
  Administered 2023-03-03 (×2): 10 mg via INTRAVENOUS

## 2023-03-03 MED ORDER — FENTANYL CITRATE (PF) 250 MCG/5ML IJ SOLN
INTRAMUSCULAR | Status: AC
  Filled 2023-03-03: qty 5

## 2023-03-03 MED ORDER — CHLORHEXIDINE GLUCONATE 0.12 % MT SOLN: 15.0000 mL | Freq: Once | OROMUCOSAL | Status: AC

## 2023-03-03 MED ORDER — VASOPRESSIN 20 UNIT/ML IV SOLN
INTRAVENOUS | Status: AC
  Filled 2023-03-03: qty 1

## 2023-03-03 MED ORDER — DEXAMETHASONE SODIUM PHOSPHATE 10 MG/ML IJ SOLN
INTRAMUSCULAR | Status: DC | PRN
  Administered 2023-03-03: 4 mg via INTRAVENOUS

## 2023-03-03 MED ORDER — TRANEXAMIC ACID-NACL 1000-0.7 MG/100ML-% IV SOLN
1000.0000 mg | INTRAVENOUS | Status: AC
  Administered 2023-03-03: 1000 mg via INTRAVENOUS

## 2023-03-03 MED ORDER — 0.9 % SODIUM CHLORIDE (POUR BTL) OPTIME
TOPICAL | Status: DC | PRN
  Administered 2023-03-03: 1000 mL

## 2023-03-03 MED ORDER — CEFAZOLIN SODIUM-DEXTROSE 2-4 GM/100ML-% IV SOLN
INTRAVENOUS | Status: AC
  Filled 2023-03-03: qty 100

## 2023-03-03 SURGICAL SUPPLY — 59 items
BLADE SAGITTAL 25.0X1.27X90 (BLADE) ×1 IMPLANT
BRUSH FEMORAL CANAL (MISCELLANEOUS) IMPLANT
CEMENT BONE SIMPLEX SPEEDSET (Cement) IMPLANT
CHLORAPREP W/TINT 26 (MISCELLANEOUS) ×2 IMPLANT
COVER SURGICAL LIGHT HANDLE (MISCELLANEOUS) ×1 IMPLANT
DERMABOND ADVANCED .7 DNX12 (GAUZE/BANDAGES/DRESSINGS) ×1 IMPLANT
DRAPE HALF SHEET 40X57 (DRAPES) ×1 IMPLANT
DRAPE HIP W/POCKET STRL (MISCELLANEOUS) ×1 IMPLANT
DRAPE INCISE IOBAN 66X45 STRL (DRAPES) ×1 IMPLANT
DRAPE INCISE IOBAN 85X60 (DRAPES) ×1 IMPLANT
DRAPE POUCH INSTRU U-SHP 10X18 (DRAPES) ×1 IMPLANT
DRAPE U-SHAPE 47X51 STRL (DRAPES) ×2 IMPLANT
DRSG AQUACEL AG ADV 3.5X10 (GAUZE/BANDAGES/DRESSINGS) ×1 IMPLANT
ELECT BLADE 4.0 EZ CLEAN MEGAD (MISCELLANEOUS) ×1
ELECT REM PT RETURN 15FT ADLT (MISCELLANEOUS) ×1 IMPLANT
ELECTRODE BLDE 4.0 EZ CLN MEGD (MISCELLANEOUS) ×1 IMPLANT
FEMORAL HEAD LFIT (Knees) ×1 IMPLANT
GLOVE SRG 8 PF TXTR STRL LF DI (GLOVE) ×1 IMPLANT
GLOVE SURG ORTHO LTX SZ8 (GLOVE) ×2 IMPLANT
GLOVE SURG UNDER POLY LF SZ8 (GLOVE) ×1
GOWN STRL REUS W/ TWL LRG LVL3 (GOWN DISPOSABLE) ×1 IMPLANT
GOWN STRL REUS W/ TWL XL LVL3 (GOWN DISPOSABLE) ×2 IMPLANT
GOWN STRL REUS W/TWL LRG LVL3 (GOWN DISPOSABLE) ×1
GOWN STRL REUS W/TWL XL LVL3 (GOWN DISPOSABLE) ×2
HANDPIECE INTERPULSE COAX TIP (DISPOSABLE) ×1
HEAD FEM 0XOFST TPR 26X (Knees) IMPLANT
HEAD LOCK BP UHR HIP 26X43 (Orthopedic Implant) IMPLANT
HOOD PEEL AWAY T7 (MISCELLANEOUS) ×3 IMPLANT
KIT BASIN OR (CUSTOM PROCEDURE TRAY) ×1 IMPLANT
KIT TURNOVER KIT A (KITS) ×1 IMPLANT
MANIFOLD NEPTUNE II (INSTRUMENTS) ×1 IMPLANT
MARKER SKIN DUAL TIP RULER LAB (MISCELLANEOUS) ×1 IMPLANT
NDL 18GX1X1/2 (RX/OR ONLY) (NEEDLE) ×2 IMPLANT
NEEDLE 18GX1X1/2 (RX/OR ONLY) (NEEDLE) ×2 IMPLANT
NS IRRIG 1000ML POUR BTL (IV SOLUTION) ×1 IMPLANT
PACK TOTAL JOINT (CUSTOM PROCEDURE TRAY) ×1 IMPLANT
PRESSURIZER FEMORAL UNIV (MISCELLANEOUS) ×1 IMPLANT
RETRIEVER SUT HEWSON (MISCELLANEOUS) ×1 IMPLANT
SEALER BIPOLAR AQUA 6.0 (INSTRUMENTS) ×1 IMPLANT
SET HNDPC FAN SPRY TIP SCT (DISPOSABLE) IMPLANT
SET INTERPULSE LAVAGE W/TIP (ORTHOPEDIC DISPOSABLE SUPPLIES) IMPLANT
SPONGE T-LAP 18X18 ~~LOC~~+RFID (SPONGE) IMPLANT
STEM FEM CMT 43X131X35 SZ3 (Stem) IMPLANT
SUCTION FRAZIER HANDLE 10FR (MISCELLANEOUS) ×1
SUCTION TUBE FRAZIER 10FR DISP (MISCELLANEOUS) ×1 IMPLANT
SUT BONE WAX W31G (SUTURE) ×1 IMPLANT
SUT ETHIBOND 2 V 37 (SUTURE) ×1 IMPLANT
SUT MNCRL AB 3-0 PS2 27 (SUTURE) ×1 IMPLANT
SUT STRATAFIX 1PDS 45CM VIOLET (SUTURE) ×2 IMPLANT
SUT VIC AB 0 CT1 27 (SUTURE) ×1
SUT VIC AB 0 CT1 27XBRD ANBCTR (SUTURE) ×1 IMPLANT
SUT VIC AB 2-0 CT2 27 (SUTURE) ×2 IMPLANT
SYR 20ML LL LF (SYRINGE) ×1 IMPLANT
SYR 50ML LL SCALE MARK (SYRINGE) ×1 IMPLANT
TOWEL GREEN STERILE (TOWEL DISPOSABLE) ×1 IMPLANT
TOWER CARTRIDGE SMART MIX (DISPOSABLE) IMPLANT
TRAY FOLEY MTR SLVR 16FR STAT (SET/KITS/TRAYS/PACK) IMPLANT
TUBE SUCT ARGYLE STRL (TUBING) ×1 IMPLANT
WATER STERILE IRR 1000ML POUR (IV SOLUTION) ×1 IMPLANT

## 2023-03-03 NOTE — Anesthesia Preprocedure Evaluation (Addendum)
Anesthesia Evaluation  Patient identified by MRN, date of birth, ID band Patient awake    Reviewed: Allergy & Precautions, H&P , NPO status , Patient's Chart, lab work & pertinent test results  Airway Mallampati: II  TM Distance: >3 FB Neck ROM: Full    Dental no notable dental hx. (+) Teeth Intact, Dental Advisory Given   Pulmonary neg pulmonary ROS   Pulmonary exam normal breath sounds clear to auscultation       Cardiovascular hypertension,  Rhythm:Regular Rate:Normal     Neuro/Psych negative neurological ROS  negative psych ROS   GI/Hepatic Neg liver ROS,GERD  ,,  Endo/Other  diabetes, Type 2, Oral Hypoglycemic Agents    Renal/GU negative Renal ROS  negative genitourinary   Musculoskeletal  (+) Arthritis , Osteoarthritis,    Abdominal   Peds  Hematology negative hematology ROS (+)   Anesthesia Other Findings   Reproductive/Obstetrics negative OB ROS                             Anesthesia Physical Anesthesia Plan  ASA: 2  Anesthesia Plan: Spinal   Post-op Pain Management: Ofirmev IV (intra-op)*   Induction: Intravenous  PONV Risk Score and Plan: 3 and Ondansetron, Dexamethasone and Propofol infusion  Airway Management Planned: Natural Airway and Simple Face Mask  Additional Equipment:   Intra-op Plan:   Post-operative Plan:   Informed Consent: I have reviewed the patients History and Physical, chart, labs and discussed the procedure including the risks, benefits and alternatives for the proposed anesthesia with the patient or authorized representative who has indicated his/her understanding and acceptance.     Dental advisory given  Plan Discussed with: CRNA  Anesthesia Plan Comments:        Anesthesia Quick Evaluation

## 2023-03-03 NOTE — H&P (Signed)
History and Physical    Patient: Savannah Harris ZOX:[id number] DOB: [birth year] DOA: 03/03/2023 DOS: the patient was seen and examined on 03/03/2023 PCP: Cleatis Polka., MD  Patient coming from: Home - lives with her husband. Uses a walker and cane.    Chief Complaint: fall   HPI: Savannah Harris is a 87 y.o. female with medical history significant of T2DM, HTN, HLD,  OSA, OAB who presented to ED after a fall at home. She got up to use the restroom at [facility] and as she was standing up and adjusting her adult diaper brief, she fell and hit her head and left side with immediate pain in her left hip. She was unable to get up so her husband called 911. She states pain is in her left groin. She has no numbness or tingling and sensation intact. She does have history of frequent falls with hx of fracture to other hip with ORIF. She last ate around 6pm yesterday. Only on ASA.    She has been feeling good. Denies any fever/chills, vision changes/headaches, chest pain or palpitations, shortness of breath or cough, abdominal pain, N/V/D, dysuria or leg swelling.   She does not smoke or drink alcohol.    ER Course:  vitals: afebrile, bp: 16/69, HR: 65 RR: 16, oxygen: 96%RA Pertinent labs: none CXR: no acute finding Hip/pelvis xray: Minimally displaced left femoral neck fracture.  Ct head/neck: no acute finding Mild cerebral atrophy with extensive chronic microvascular ischemic changes and old lacunar infarcts, similar to the prior study, as above. 3. Multilevel degenerative disc disease and cervical spondylosis, as above. Lumbar xray: . Subacute superior endplate fractures at L2, L3 and L4 are new since April. 2. Subacute fractures at the inferior endplate of T10 and superior endplate of T11 are new since April. 3. Left femoral neck fracture. In ED: ortho called plan for OR today. [facility] asked to admit.    Review of Systems: As mentioned in the history of present illness. All other systems  reviewed and are negative. Past Medical History:  Diagnosis Date   Arthritis    WRIST/ HANDS   Asymptomatic vertebral artery stenosis    RIGHT SIDE   Diverticulitis 11-30-13   during Christmas holiday- resolved   GERD (gastroesophageal reflux disease)    Hyperlipidemia    Hypertension    no meds   Leg cramps    OCCASIONAL   Sleep apnea 11-30-13   mild-no cpap.   Type 2 diabetes mellitus (HCC)    Urge urinary incontinence    Past Surgical History:  Procedure Laterality Date   APPENDECTOMY     at 87 years old   CATARACT EXTRACTION W/ INTRAOCULAR LENS  IMPLANT, BILATERAL     COLONOSCOPY WITH PROPOFOL N/A 12/15/2013   Procedure: COLONOSCOPY WITH PROPOFOL;  Surgeon: Charolett Bumpers, MD;  Location: [facility];  Service: Endoscopy;  Laterality: N/A;   INTERSTIM IMPLANT PLACEMENT N/A 07/28/2013   Procedure: Leane Platt IMPLANT FIRST STAGE;  Surgeon: Martina Sinner, MD;  Location: [facility];  Service: Urology;  Laterality: N/A;   INTERSTIM IMPLANT PLACEMENT N/A 07/28/2013   Procedure: Leane Platt IMPLANT SECOND STAGE;  Surgeon: Martina Sinner, MD;  Location: [facility];  Service: Urology;  Laterality: N/A;   KYPHOPLASTY N/A 02/08/2023   Procedure: T11,L1 KYPHOPLASTY;  Surgeon: Jadene Pierini, MD;  Location: [facility];  Service: Neurosurgery;  Laterality: N/A;   LEFT WRSIT TENDON REPAIR  10/15/1997   OPEN REDUCTION INTERNAL FIXATION (  ORIF) DISTAL RADIAL FRACTURE Right 03/20/2019   Procedure: OPEN REDUCTION INTERNAL FIXATION (ORIF) DISTAL RADIAL FRACTURE;  Surgeon: Betha Loa, MD;  Location: [facility];  Service: Orthopedics;  Laterality: Right;  regional block   PUBOVAGINAL SLING  08/26/2007   SPARC SLING   TONSILLECTOMY AND ADENOIDECTOMY  AS CHILD   TOTAL ABDOMINAL HYSTERECTOMY W/ BILATERAL SALPINGOOPHORECTOMY  1970s   AND APPENDECTOMY   TOTAL HIP ARTHROPLASTY Right 12/16/2008   Social History:  reports that she has never  smoked. She has never used smokeless tobacco. She reports that she does not drink alcohol and does not use drugs.  Allergies  Allergen Reactions   Latex Other (See Comments)    BLISTERS   Sulfamethoxazole-Trimethoprim Nausea And Vomiting   Adhesive [Tape] Other (See Comments)    BLISTERS   Canagliflozin Other (See Comments)    Pt states Invokana causes dizziness   Codeine Nausea And Vomiting    Family History  Problem Relation Age of Onset   Diabetes Mother    Alzheimer's disease Mother    Dementia Mother     Prior to Admission medications   Medication Sig Start Date End Date Taking? Authorizing Provider  acetaminophen (TYLENOL) 500 MG tablet Take 1,000 mg by mouth 3 (three) times daily as needed for moderate pain.    [provider]  aspirin EC 81 MG tablet Take 81 mg by mouth at bedtime.    [provider]  Brimonidine Tartrate (LUMIFY) 0.025 % SOLN Place 1 drop into both eyes 2 (two) times daily as needed (redness).    [provider]  Cholecalciferol (VITAMIN D3) 5000 UNITS CAPS Take 5,000 Units by mouth daily.     [provider]  cyanocobalamin (VITAMIN B12) 1000 MCG tablet Take 1,000 mcg by mouth daily.    [provider]  cycloSPORINE (RESTASIS) 0.05 % ophthalmic emulsion Place 1 drop into both eyes 2 (two) times daily.    [provider]  donepezil (ARICEPT) 10 MG tablet Take 10 mg by mouth at bedtime.    [provider]  Dulaglutide (TRULICITY) 3 MG/0.5ML SOPN Inject 3 mg into the skin every 30 (thirty) days.    [provider]  fluticasone (FLONASE) 50 MCG/ACT nasal spray Place 1 spray into both nostrils daily as needed for allergies or rhinitis.    [provider]  glimepiride (AMARYL) 1 MG tablet Take 1 mg by mouth daily.    [provider]  HYDROcodone-acetaminophen (NORCO) 5-325 MG tablet Take 1-2 tablets by mouth every 4 (four) hours as needed (pain). 1-2 tabs po q6 hours prn  pain 02/08/23   Jadene Pierini, MD  metFORMIN (GLUCOPHAGE) 1000 MG tablet Take 1,000 mg by mouth 2 (two) times daily.    [provider]  Omega-3 Fatty Acids (FISH OIL) 1000 MG CAPS Take 1,000 mg by mouth daily.    [provider]  OVER THE COUNTER MEDICATION Take 2 capsules by mouth at bedtime. Relaxium sleep aid    [provider]  Probiotic Product (ALIGN) 4 MG CAPS Take 4 mg by mouth daily.    [provider]  rosuvastatin (CRESTOR) 10 MG tablet Take 10 mg by mouth daily.    [provider]  sertraline (ZOLOFT) 50 MG tablet Take 50 mg by mouth daily.    [provider]    Physical Exam: Vitals:   03/03/23 0810 03/03/23 0900 03/03/23 0949 03/03/23 1007  BP: (!) 151/75 (!) 159/72    Pulse: 72  65    Resp: 12 16    Temp:   98.6 F (37 C)   TempSrc:   Oral   SpO2: 98% 100%    Weight:    53.5 kg  Height:    4' 11.02" (1.499 m)   General:  Appears calm and comfortable and is in NAD Eyes:  PERRL, EOMI, normal lids, iris ENT:  grossly normal hearing, lips & tongue, mmm; appropriate dentition Neck:  no LAD, masses or thyromegaly; no carotid bruits Cardiovascular:  RRR, no m/r/g. No LE edema.  Respiratory:   CTA bilaterally with no wheezes/rales/rhonchi.  Normal respiratory effort. Abdomen:  soft, TTP in right lower quadrant. No rebound or guarding, ND, NABS Back:   normal alignment, no CVAT Skin:  no rash or induration seen on limited exam Musculoskeletal:  grossly normal tone BUE. Left lower hip: TTP over greater trochanter/left groin.  Only slightly shortened. No edema or bruising. Pedal pulses intact. Sensation intact.  Lower extremity:  No LE edema.  Limited foot exam with no ulcerations.  2+ distal pulses. Psychiatric:  grossly normal mood and affect, speech fluent and appropriate, AOx3 Neurologic:  CN 2-12 grossly intact, moves all extremities in coordinated fashion, sensation intact   Radiological Exams on  Admission: Independently reviewed - see discussion in A/P where applicable  CT THORACIC SPINE WO CONTRAST  Result Date: 03/03/2023 CLINICAL DATA:  Age related osteoporosis with current pathological fracture. EXAM: CT THORACIC SPINE WITHOUT CONTRAST TECHNIQUE: Multidetector CT images of the thoracic were obtained using the standard protocol without intravenous contrast. RADIATION DOSE REDUCTION: This exam was performed according to the departmental dose-optimization program which includes automated exposure control, adjustment of the mA and/or kV according to patient size and/or use of iterative reconstruction technique. COMPARISON:  CT of the thoracic spine without contrast 01/23/2023. FINDINGS: Alignment: No significant listhesis is present. Vertebrae: Interval spinal augmentation present at T11. Methylmethacrylate extends into the T11 disc space. A new inferior endplate fracture is present at T10 with 25% loss of height. A new superior endplate fracture is present at T12 with 25% loss of height anteriorly. No retropulsed bone is present at either level. A remote T4 superior endplate fracture is stable. Vertebral body heights are otherwise maintained. A benign sclerotic focus is present in the posterior aspect of C7. No other focal osseous lesions are present. Paraspinal and other soft tissues: Prevertebral soft tissue swelling/hemorrhage is present at T10 and T12. The paravertebral soft tissues are otherwise within normal limits. Atherosclerotic calcifications are present the aortic arch and descending thoracic aorta. Artery calcifications are present. Mild dependent atelectasis is present in both lungs. The lungs are otherwise clear. Disc levels: A spinal cord stimulator is stable, extending to the T7 level. Previously described multilevel spondylosis of thoracic spine is stable. No significant change is present T10-11 or T11-12. IMPRESSION: 1. Interval spinal augmentation at T11. 2. Methylmethacrylate  extends into the T10-11 disc space. 3. New inferior endplate fracture at T10 with 25% loss of height. 4. New superior endplate fracture at T12 with 25% loss of height anteriorly. 5. Prevertebral soft tissue swelling/hemorrhage at T10 and T12. 6. Stable remote T4 superior endplate fracture. 7. Stable spinal cord stimulator. 8. Stable multilevel spondylosis of the thoracic spine. 9.  Aortic Atherosclerosis (ICD10-I70.0). Electronically Signed   By: Marin Roberts M.D.   On: 03/03/2023 08:39   CT HEAD WO CONTRAST ( )  Result Date: 03/03/2023 CLINICAL DATA:  87 year old female with history of trauma from a fall. EXAM: CT HEAD WITHOUT  CONTRAST CT CERVICAL SPINE WITHOUT CONTRAST TECHNIQUE: Multidetector CT imaging of the head and cervical spine was performed following the standard protocol without intravenous contrast. Multiplanar CT image reconstructions of the cervical spine were also generated. RADIATION DOSE REDUCTION: This exam was performed according to the departmental dose-optimization program which includes automated exposure control, adjustment of the mA and/or kV according to patient size and/or use of iterative reconstruction technique. COMPARISON:  CT of the head and cervical spine 06/27/2022. FINDINGS: CT HEAD FINDINGS Brain: Mild cerebral atrophy. Patchy and confluent areas of decreased attenuation are noted throughout the deep and periventricular white matter of the cerebral hemispheres bilaterally, compatible with chronic microvascular ischemic disease. More well-defined area of low attenuation in the right anterior basal ganglia and left cerebellar hemisphere, compatible with old lacunar infarcts. No evidence of acute infarction, hemorrhage, hydrocephalus, extra-axial collection or mass lesion/mass effect. Vascular: No hyperdense vessel or unexpected calcification. Skull: Normal. Negative for fracture or focal lesion. Sinuses/Orbits: No acute finding. Other: None. CT CERVICAL SPINE FINDINGS  Alignment: 2 mm of anterolisthesis of C5 upon C6. Alignment is otherwise anatomic. Skull base and vertebrae: No acute fracture. No primary bone lesion or focal pathologic process. Soft tissues and spinal canal: No prevertebral fluid or swelling. No visible canal hematoma. Disc levels: Mild multilevel degenerative disc disease, most evident at C6-C7. Mild multilevel facet arthropathy bilaterally. Well-defined sclerotic focus in the posterior aspect of C7 vertebral body, similar to the prior study from 2023, compatible with a benign bone island. Upper chest: Unremarkable. Other: None. IMPRESSION: 1. No evidence of significant acute traumatic injury to the skull, brain or cervical spine. 2. Mild cerebral atrophy with extensive chronic microvascular ischemic changes and old lacunar infarcts, similar to the prior study, as above. 3. Multilevel degenerative disc disease and cervical spondylosis, as above. Electronically Signed   By: Trudie Reed M.D.   On: 03/03/2023 08:27   CT CERVICAL SPINE WO CONTRAST  Result Date: 03/03/2023 CLINICAL DATA:  87 year old female with history of trauma from a fall. EXAM: CT HEAD WITHOUT CONTRAST CT CERVICAL SPINE WITHOUT CONTRAST TECHNIQUE: Multidetector CT imaging of the head and cervical spine was performed following the standard protocol without intravenous contrast. Multiplanar CT image reconstructions of the cervical spine were also generated. RADIATION DOSE REDUCTION: This exam was performed according to the departmental dose-optimization program which includes automated exposure control, adjustment of the mA and/or kV according to patient size and/or use of iterative reconstruction technique. COMPARISON:  CT of the head and cervical spine 06/27/2022. FINDINGS: CT HEAD FINDINGS Brain: Mild cerebral atrophy. Patchy and confluent areas of decreased attenuation are noted throughout the deep and periventricular white matter of the cerebral hemispheres bilaterally, compatible  with chronic microvascular ischemic disease. More well-defined area of low attenuation in the right anterior basal ganglia and left cerebellar hemisphere, compatible with old lacunar infarcts. No evidence of acute infarction, hemorrhage, hydrocephalus, extra-axial collection or mass lesion/mass effect. Vascular: No hyperdense vessel or unexpected calcification. Skull: Normal. Negative for fracture or focal lesion. Sinuses/Orbits: No acute finding. Other: None. CT CERVICAL SPINE FINDINGS Alignment: 2 mm of anterolisthesis of C5 upon C6. Alignment is otherwise anatomic. Skull base and vertebrae: No acute fracture. No primary bone lesion or focal pathologic process. Soft tissues and spinal canal: No prevertebral fluid or swelling. No visible canal hematoma. Disc levels: Mild multilevel degenerative disc disease, most evident at C6-C7. Mild multilevel facet arthropathy bilaterally. Well-defined sclerotic focus in the posterior aspect of C7 vertebral body, similar to the prior study from  2023, compatible with a benign bone island. Upper chest: Unremarkable. Other: None. IMPRESSION: 1. No evidence of significant acute traumatic injury to the skull, brain or cervical spine. 2. Mild cerebral atrophy with extensive chronic microvascular ischemic changes and old lacunar infarcts, similar to the prior study, as above. 3. Multilevel degenerative disc disease and cervical spondylosis, as above. Electronically Signed   By: Trudie Reed M.D.   On: 03/03/2023 08:27   DG Lumbar Spine Complete  Result Date: 03/03/2023 CLINICAL DATA:  Fall.  Recent surgery. EXAM: LUMBAR SPINE - COMPLETE 5 VIEW COMPARISON:  CT of the lumbar spine 03/01/23 FINDINGS: Spinal augmentation is present at T11 and L1. Methylmethacrylate extends into the L1-2 disc space. Subacute superior endplate fractures at L2, L3 and L4 are new since April. Small amount of methylmethacrylate extends into the disc space of T10-11. Subacute fractures at the inferior  endplate of T10 and superior endplate of T11 are new since April. Vertebral body heights maintained at L5. Alignment is anatomic. Lumbar lordosis is preserved. Spinal cord stimulator noted. Sacral stimulator noted. Right total hip arthroplasty is present. Left femoral neck fracture is again noted. IMPRESSION: 1. Subacute superior endplate fractures at L2, L3 and L4 are new since April. 2. Subacute fractures at the inferior endplate of T10 and superior endplate of T11 are new since April. 3. Left femoral neck fracture. Electronically Signed   By: Marin Roberts M.D.   On: 03/03/2023 08:19   DG Chest Port 1 View  Result Date: 03/03/2023 CLINICAL DATA:  Left hip pain status post fall EXAM: PORTABLE CHEST - 1 VIEW COMPARISON:  03/19/2019 FINDINGS: Cardiomediastinal silhouette and pulmonary vasculature are within normal limits. Lungs are clear. Neurostimulator leads terminate in the midthoracic spine. Vertebral augmentation changes seen in the T11 and L1 vertebral bodies. IMPRESSION: No acute cardiopulmonary process. Electronically Signed   By: Acquanetta Belling M.D.   On: 03/03/2023 07:19   DG Pelvis Portable  Result Date: 03/03/2023 CLINICAL DATA:  Fall in bathroom.  Left hip pain. EXAM: PORTABLE PELVIS 1-2 VIEWS; DG HIP (WITH OR WITHOUT PELVIS) 1V PORT LEFT COMPARISON:  CT abdomen pelvis 10/07/2013 and pelvis radiograph 07/03/2017 FINDINGS: Left hip: Minimally displaced femoral neck fracture. Soft tissues are unremarkable. Pelvis: Visualized portions of the right total hip prosthesis are unremarkable. Neurostimulator device noted overlying the right pelvis. Severe degenerative changes of the visualized lower lumbar spine. No additional fractures or dislocations. IMPRESSION: Minimally displaced left femoral neck fracture. Electronically Signed   By: Acquanetta Belling M.D.   On: 03/03/2023 07:17   DG Hip Port [place] Pelvis 1 View Left  Result Date: 03/03/2023 CLINICAL DATA:  Fall in bathroom.  Left hip  pain. EXAM: PORTABLE PELVIS 1-2 VIEWS; DG HIP (WITH OR WITHOUT PELVIS) 1V PORT LEFT COMPARISON:  CT abdomen pelvis 10/07/2013 and pelvis radiograph 07/03/2017 FINDINGS: Left hip: Minimally displaced femoral neck fracture. Soft tissues are unremarkable. Pelvis: Visualized portions of the right total hip prosthesis are unremarkable. Neurostimulator device noted overlying the right pelvis. Severe degenerative changes of the visualized lower lumbar spine. No additional fractures or dislocations. IMPRESSION: Minimally displaced left femoral neck fracture. Electronically Signed   By: Acquanetta Belling M.D.   On: 03/03/2023 07:17    EKG: Independently reviewed.  NSR with rate 64; nonspecific ST changes with no evidence of acute ischemia   Labs on Admission: I have personally reviewed the available labs and imaging studies at the time of the admission.  Pertinent labs:   None  Assessment and Plan: Principal Problem:   Closed displaced fracture of left femoral neck (HCC) Active Problems:   Compression fracture of body of thoracic vertebra/lumbar   Type 2 diabetes mellitus with complication (HCC)   Essential hypertension   Hyperlipidemia   Sleep apnea    Assessment and Plan: * Closed displaced fracture of left femoral neck (HCC) 87 year old presenting after mechanical fall at home found to have minimally displaced left femoral neck fracture.  -admit to med-surg -ortho consulted with plan for surgery now (Dr. Blanchie Dessert) -NPO  -SCDs -pain medication with tylenol, norco and morphine for severe pain -hip fracture protocol   -check vitamin D  -DVT PPE/weight bearing per ortho   Compression fracture of body of thoracic vertebra/lumbar History of T11/L1 compression fracture after fall in tub s/p kyphoplasty x 2 by Dr. Maurice Small on 02/08/2023  Lumbar xray today shows some subacute superior endplate fractures at L2, L3, L4 which are new since April. Also has subacute fractures at the inferior endplate of  T10 and superior endplate of T11 which are new since April Neurosurgery was called by EDP. Recommended pain control and f/u outpatient in one week with Dr. Maurice Small    Type 2 diabetes mellitus with complication (HCC) A1C 6.5 a few weeks ago  Hold metformin and amaryl  Sensitive SSI and accuchecks qac/hs   Essential hypertension On no medication  PRN with parameters   Hyperlipidemia Continue crestor 10mg  daily   Sleep apnea Can not tolerate Cpap      Advance Care Planning:   Code Status: Full Code   Consults: ortho: Dr. Blanchie Dessert and neurosurgery   DVT Prophylaxis: SCD  Family Communication: none   Severity of Illness: The appropriate patient status for this patient is INPATIENT. Inpatient status is judged to be reasonable and necessary in order to provide the required intensity of service to ensure the patient's safety. The patient's presenting symptoms, physical exam findings, and initial radiographic and laboratory data in the context of their chronic comorbidities is felt to place them at high risk for further clinical deterioration. Furthermore, it is not anticipated that the patient will be medically stable for discharge from the hospital within 2 midnights of admission.   * I certify that at the point of admission it is my clinical judgment that the patient will require inpatient hospital care spanning beyond 2 midnights from the point of admission due to high intensity of service, high risk for further deterioration and high frequency of surveillance required.*  Author: Orland Mustard, MD 03/03/2023 10:45 AM  For on call review [email or web address].

## 2023-03-03 NOTE — Assessment & Plan Note (Signed)
On no medication  PRN with parameters

## 2023-03-03 NOTE — ED Provider Notes (Signed)
[facility] EMERGENCY DEPARTMENT AT [facility] Provider Note   CSN: [id number] Arrival date & time: 03/03/23  0547     History  Chief Complaint  Patient presents with   Fall   Leg Pain    Savannah Harris is a 87 y.o. female.  Patient presents to the emergency department for evaluation after a fall.  Patient fell at home just prior to arrival.  She is complaining of left hip pain.  She does report that she hit her head but did not lose consciousness.  She does not take any thinners other than aspirin.       Home Medications Prior to Admission medications   Medication Sig Start Date End Date Taking? Authorizing Provider  acetaminophen (TYLENOL) 500 MG tablet Take 1,000 mg by mouth 3 (three) times daily as needed for moderate pain.    [provider]  aspirin EC 81 MG tablet Take 81 mg by mouth at bedtime.    [provider]  Brimonidine Tartrate (LUMIFY) 0.025 % SOLN Place 1 drop into both eyes 2 (two) times daily as needed (redness).    [provider]  Cholecalciferol (VITAMIN D3) 5000 UNITS CAPS Take 5,000 Units by mouth daily.     [provider]  cyanocobalamin (VITAMIN B12) 1000 MCG tablet Take 1,000 mcg by mouth daily.    [provider]  cycloSPORINE (RESTASIS) 0.05 % ophthalmic emulsion Place 1 drop into both eyes 2 (two) times daily.    [provider]  donepezil (ARICEPT) 10 MG tablet Take 10 mg by mouth at bedtime.    [provider]  Dulaglutide (TRULICITY) 3 MG/0.5ML SOPN Inject 3 mg into the skin every 30 (thirty) days.    [provider]  fluticasone (FLONASE) 50 MCG/ACT nasal spray Place 1 spray into both nostrils daily as needed for allergies or rhinitis.    [provider]  glimepiride (AMARYL) 1 MG tablet Take 1 mg by mouth daily.    [provider]  HYDROcodone-acetaminophen (NORCO) 5-325 MG tablet Take 1-2 tablets by mouth every 4 (four) hours as needed (pain).  1-2 tabs po q6 hours prn pain 02/08/23   Jadene Pierini, MD  metFORMIN (GLUCOPHAGE) 1000 MG tablet Take 1,000 mg by mouth 2 (two) times daily.    [provider]  Omega-3 Fatty Acids (FISH OIL) 1000 MG CAPS Take 1,000 mg by mouth daily.    [provider]  OVER THE COUNTER MEDICATION Take 2 capsules by mouth at bedtime. Relaxium sleep aid    [provider]  Probiotic Product (ALIGN) 4 MG CAPS Take 4 mg by mouth daily.    [provider]  rosuvastatin (CRESTOR) 10 MG tablet Take 10 mg by mouth daily.    [provider]  sertraline (ZOLOFT) 50 MG tablet Take 50 mg by mouth daily.    [provider]      Allergies    Latex, Sulfamethoxazole-trimethoprim, Adhesive [tape], Canagliflozin, and Codeine    Review of Systems   Review of Systems  Physical Exam Updated Vital Signs BP (!) 155/81   Pulse 65   Temp 97.7 F (36.5 C)   Resp (!) 29   Ht 4\' 11"  (1.499 m)   Wt 53.5 kg   SpO2 100%   BMI 23.82 kg/m  Physical Exam Vitals and nursing note reviewed.  Constitutional:      General: She is not in acute distress.    Appearance: She is well-developed.  HENT:  Head: Normocephalic and atraumatic.     Mouth/Throat:     Mouth: Mucous membranes are moist.  Eyes:     General: Vision grossly intact. Gaze aligned appropriately.     Extraocular Movements: Extraocular movements intact.     Conjunctiva/sclera: Conjunctivae normal.  Cardiovascular:     Rate and Rhythm: Normal rate and regular rhythm.     Pulses: Normal pulses.     Heart sounds: Normal heart sounds, S1 normal and S2 normal. No murmur heard.    No friction rub. No gallop.  Pulmonary:     Effort: Pulmonary effort is normal. No respiratory distress.     Breath sounds: Normal breath sounds.  Abdominal:     General: Bowel sounds are normal.     Palpations: Abdomen is soft.     Tenderness: There is no abdominal tenderness. There is no guarding or rebound.     Hernia:  No hernia is present.  Musculoskeletal:        General: No swelling.     Cervical back: Full passive range of motion without pain, normal range of motion and neck supple. No spinous process tenderness or muscular tenderness. Normal range of motion.     Left hip: Tenderness and bony tenderness present. No deformity. Decreased range of motion.     Right lower leg: No edema.     Left lower leg: No edema.  Skin:    General: Skin is warm and dry.     Capillary Refill: Capillary refill takes less than 2 seconds.     Findings: No ecchymosis, erythema, rash or wound.  Neurological:     General: No focal deficit present.     Mental Status: She is alert and oriented to person, place, and time.     GCS: GCS eye subscore is 4. GCS verbal subscore is 5. GCS motor subscore is 6.     Cranial Nerves: Cranial nerves 2-12 are intact.     Sensory: Sensation is intact.     Motor: Motor function is intact.     Coordination: Coordination is intact.  Psychiatric:        Attention and Perception: Attention normal.        Mood and Affect: Mood normal.        Speech: Speech normal.        Behavior: Behavior normal.     ED Results / Procedures / Treatments   Labs (all labs ordered are listed, but only abnormal results are displayed) Labs Reviewed  CBC WITH DIFFERENTIAL/PLATELET  BASIC METABOLIC PANEL    EKG EKG Interpretation  Date/Time:  Sunday Mar 03 2023 06:00:03 EDT Ventricular Rate:  64 PR Interval:  151 QRS Duration: 91 QT Interval:  421 QTC Calculation: 435 R Axis:   -44 Text Interpretation: Sinus rhythm Consider right atrial enlargement Inferior infarct, old Consider anterior infarct Artifact in lead(s) II III aVR aVL aVF V3 V4 V5 V6 Confirmed by Gilda Crease [id number]) on 03/03/2023 6:09:51 AM  Radiology No results found.  Procedures Procedures    Medications Ordered in ED Medications  morphine (PF) 4 MG/ML injection 4 mg (has no administration in time range)  ondansetron  (ZOFRAN) injection 4 mg (has no administration in time range)    ED Course/ Medical Decision Making/ A&P                             Medical Decision Making Amount and/or Complexity of Data Reviewed  Radiology: ordered.   Patient presents to the emergency department for evaluation after a fall.  Patient complaining of left groin and hip pain after the fall.  Examination reveals significant tenderness and pain with minimal motion.  X-ray confirms fracture.  Will perform CT head and cervical spine.  Patient will require hospitalization for definitive care of left hip fracture.        Final Clinical Impression(s) / ED Diagnoses Final diagnoses:  Closed fracture of left hip, initial encounter [facility])    Rx / DC Orders ED Discharge Orders     None         Hershal Eriksson, Canary Brim, MD 03/03/23 [phone]

## 2023-03-03 NOTE — Discharge Instructions (Signed)

## 2023-03-03 NOTE — ED Provider Notes (Signed)
Care transferred to me. Patient doesn't think she injured her back but would like an Xray. She has followed with Dr. Dalldorf/[facility] for years. However, Haynes Bast is unable to do surgery today ([place]). Discussed with on call ortho, Dr. Blanchie Dessert, he can take to OR today if medically cleared. Has been NPO since 6 pm last night.  Also noted to have some subacute fractures in her L-spine, though she has been dealing with pain for weeks since her surgery, I do not think this is from trauma tonight. Seems to correlate with CTs from 2 days ago (not yet formally read by radiology but compared this AM by morning L-spine Xray radiologist.   Discussed with neurosurgery, Alli Cosentino. Should get a TLSO and see neurosurgery as outpatient next week. Can still have hip surgery, no other restrictions.  Dr. Artis Flock will admit   Savannah Loveless, MD 03/03/23 1255

## 2023-03-03 NOTE — Interval H&P Note (Signed)
The patient has been re-examined, and the chart reviewed, and there have been no interval changes to the documented history and physical.    Plan for L hip cemented hemiarthroplasty   The operative side was examined and the patient was confirmed to have sensation to DPN, SPN, TN intact, Motor EHL, ext, flex 5/5, and DP 2+, PT 2+, No significant edema.   The risks, benefits, and alternatives have been discussed at length with patient, and the patient is willing to proceed.  Left hip marked. Consent has been signed.

## 2023-03-03 NOTE — Anesthesia Postprocedure Evaluation (Signed)
Anesthesia Post Note  Patient: Savannah Harris  Procedure(s) Performed: ARTHROPLASTY BIPOLAR HIP (HEMIARTHROPLASTY) (Left: Hip)     Patient location during evaluation: PACU Anesthesia Type: General Level of consciousness: awake and alert Pain management: pain level controlled Vital Signs Assessment: post-procedure vital signs reviewed and stable Respiratory status: spontaneous breathing, nonlabored ventilation, respiratory function stable and patient connected to nasal cannula oxygen Cardiovascular status: blood pressure returned to baseline and stable Postop Assessment: no apparent nausea or vomiting Anesthetic complications: no  No notable events documented.  Last Vitals:  Vitals:   03/03/23 1400 03/03/23 1412  BP: (!) 142/65 132/63  Pulse: 74 73  Resp: 13 16  Temp: 36.4 C 36.4 C  SpO2: 99% 99%    Last Pain:  Vitals:   03/03/23 1412  TempSrc: Oral  PainSc:                  Brice Potteiger,W. EDMOND

## 2023-03-03 NOTE — Assessment & Plan Note (Addendum)
Controlled. With recent hemoglobin A1C of 6.5%. Patient is on metformin and glimepiride as an outpatient. Continue metformin and discontinue glimepiride secondary to age.

## 2023-03-03 NOTE — Assessment & Plan Note (Signed)
Can not tolerate C-pap 

## 2023-03-03 NOTE — Transfer of Care (Signed)
2Immediate Anesthesia Transfer of Care Note  Patient: Savannah Harris  Procedure(s) Performed: ARTHROPLASTY BIPOLAR HIP (HEMIARTHROPLASTY) (Left: Hip)  Patient Location: PACU  Anesthesia Type:General  Level of Consciousness: drowsy  Airway & Oxygen Therapy: Patient Spontanous Breathing and Patient connected to nasal cannula oxygen  Post-op Assessment: Report given to RN and Post -op Vital signs reviewed and stable  Post vital signs: Reviewed and stable  Last Vitals:  Vitals Value Taken Time  BP 134/65 03/03/23 1321  Temp 36.5 C 03/03/23 1321  Pulse 82 03/03/23 1328  Resp 19 03/03/23 1328  SpO2 100 % 03/03/23 1328  Vitals shown include unvalidated device data.  Last Pain:  Vitals:   03/03/23 0949  TempSrc: Oral  PainSc:          Complications: No notable events documented.

## 2023-03-03 NOTE — ED Notes (Signed)
Patient noted to have nerve stimulator due to chronic back problems. Artifact on EKG noted that lead to discovery of same.

## 2023-03-03 NOTE — H&P (View-Only) (Signed)
Time Arrived at the Bedside: 0830  ORTHOPAEDIC CONSULTATION  REQUESTING PHYSICIAN: Pricilla Loveless, MD  Chief Complaint: Left hip pain  HPI: Savannah Harris is a 87 y.o. female who complains of left hip pain.  Patient arrived to the ED complaining of left hip pain.  States she fell around 3 AM this morning while trying to put on her diaper.  Denies lightheadedness dizziness chest pain shortness of breath or other accompanying symptoms before falling or after falling.  Hx of fracture to other hip with hardware components as well.  Patient states she falls often, and has been talking to her PCP about this.  Husband at bedside.  Denies numbness or tingling of the lower extremities.  Reports pain is strongest in the groin region.  Last meal 6 PM yesterday.  No other complaints at this time  Past Medical History:  Diagnosis Date   Arthritis    WRIST/ HANDS   Asymptomatic vertebral artery stenosis    RIGHT SIDE   Diverticulitis 11-30-13   during Christmas holiday- resolved   GERD (gastroesophageal reflux disease)    Hyperlipidemia    Hypertension    no meds   Leg cramps    OCCASIONAL   Sleep apnea 11-30-13   mild-no cpap.   Type 2 diabetes mellitus (HCC)    Urge urinary incontinence    Past Surgical History:  Procedure Laterality Date   APPENDECTOMY     at 87 years old   CATARACT EXTRACTION W/ INTRAOCULAR LENS  IMPLANT, BILATERAL     COLONOSCOPY WITH PROPOFOL N/A 12/15/2013   Procedure: COLONOSCOPY WITH PROPOFOL;  Surgeon: Charolett Bumpers, MD;  Location: [facility];  Service: Endoscopy;  Laterality: N/A;   INTERSTIM IMPLANT PLACEMENT N/A 07/28/2013   Procedure: Leane Platt IMPLANT FIRST STAGE;  Surgeon: Martina Sinner, MD;  Location: [facility];  Service: Urology;  Laterality: N/A;   INTERSTIM IMPLANT PLACEMENT N/A 07/28/2013   Procedure: Leane Platt IMPLANT SECOND STAGE;  Surgeon: Martina Sinner, MD;  Location: [facility];  Service: Urology;   Laterality: N/A;   KYPHOPLASTY N/A 02/08/2023   Procedure: T11,L1 KYPHOPLASTY;  Surgeon: Jadene Pierini, MD;  Location: [facility];  Service: Neurosurgery;  Laterality: N/A;   LEFT WRSIT TENDON REPAIR  10/15/1997   OPEN REDUCTION INTERNAL FIXATION (ORIF) DISTAL RADIAL FRACTURE Right 03/20/2019   Procedure: OPEN REDUCTION INTERNAL FIXATION (ORIF) DISTAL RADIAL FRACTURE;  Surgeon: Betha Loa, MD;  Location: [facility];  Service: Orthopedics;  Laterality: Right;  regional block   PUBOVAGINAL SLING  08/26/2007   SPARC SLING   TONSILLECTOMY AND ADENOIDECTOMY  AS CHILD   TOTAL ABDOMINAL HYSTERECTOMY W/ BILATERAL SALPINGOOPHORECTOMY  1970s   AND APPENDECTOMY   TOTAL HIP ARTHROPLASTY Right 12/16/2008   Social History   Socioeconomic History   Marital status: Married    Spouse name: Not on file   Number of children: Not on file   Years of education: Not on file   Highest education level: Not on file  Occupational History   Not on file  Tobacco Use   Smoking status: Never   Smokeless tobacco: Never  Vaping Use   Vaping Use: Never used  Substance and Sexual Activity   Alcohol use: No    Alcohol/week: 0.0 standard drinks of alcohol   Drug use: No   Sexual activity: Not Currently  Other Topics Concern   Not on file  Social History Narrative   Not on file   Social Determinants of  Health   Financial Resource Strain: Not on file  Food Insecurity: Not on file  Transportation Needs: Not on file  Physical Activity: Not on file  Stress: Not on file  Social Connections: Not on file   Family History  Problem Relation Age of Onset   Diabetes Mother    Alzheimer's disease Mother    Dementia Mother    Allergies  Allergen Reactions   Latex Other (See Comments)    BLISTERS   Sulfamethoxazole-Trimethoprim Nausea And Vomiting   Adhesive [Tape] Other (See Comments)    BLISTERS   Canagliflozin Other (See Comments)    Pt states Invokana causes dizziness   Codeine Nausea  And Vomiting     Positive ROS: All other systems have been reviewed and were otherwise negative with the exception of those mentioned in the HPI and as above.  Physical Exam: General: Alert, no acute distress Cardiovascular: No pedal edema Respiratory: No cyanosis, no use of accessory musculature Skin: No lesions in the area of chief complaint Neurologic: Sensation intact distally Psychiatric: Patient is competent for consent with normal mood and affect, known history of dementia.  MUSCULOSKELETAL: ROM of left hip deferred due to known femoral neck fracture seen on imaging.  BLE appear grossly neurovascularly intact.  Dorsiflexion plantarflexion intact.  No open wound appreciated.  Left groin TTP.  No significant swelling.  IMAGING: Left femoral neck fracture without evidence of dislocation or other osseous abnormality.  Incidentally hardware of right hip appears appropriately placed without adverse features  Assessment: Active Problems:   * No active hospital problems. *   Plan: Discussed patient case in detail with attending, Dr. Blanchie Dessert.  Discussed with patient and spouse at bedside that examination and imaging is concerning for femoral neck fracture.  Recommend left hip hemiarthroplasty.  Discussed plan to proceed to OR today for surgical intervention.  Patient and family expressed understanding and plan to proceed with surgery today.  Patient's last meal was at 6 PM yesterday.    Cecil Cobbs, PA-C  Contact information:   [phone] 7am-5pm epic message Dr. Blanchie Dessert, or call office for patient follow up: [phone] After hours and holidays please check [email or web address] for group call information for [facility]

## 2023-03-03 NOTE — Anesthesia Procedure Notes (Signed)
Procedure Name: Intubation Date/Time: 03/03/2023 11:23 AM  Performed by: Gwenyth Allegra, CRNAPre-anesthesia Checklist: Patient identified, Emergency Drugs available, Suction available and Patient being monitored Patient Re-evaluated:Patient Re-evaluated prior to induction Oxygen Delivery Method: Circle System Utilized Preoxygenation: Pre-oxygenation with 100% oxygen Induction Type: IV induction Ventilation: Mask ventilation without difficulty Laryngoscope Size: Mac and 3 Grade View: Grade II Tube type: Oral Tube size: 7.0 mm Number of attempts: 1 Airway Equipment and Method: Stylet and Oral airway Placement Confirmation: ETT inserted through vocal cords under direct vision, positive ETCO2 and breath sounds checked- equal and bilateral Secured at: 20 cm Tube secured with: Tape Dental Injury: Teeth and Oropharynx as per pre-operative assessment

## 2023-03-03 NOTE — Op Note (Signed)
03/03/2023  12:37 PM  PATIENT:  Savannah Harris   MRN: [id number]  PRE-OPERATIVE DIAGNOSIS:  Left Femoral Neck fx  POST-OPERATIVE DIAGNOSIS:  Left Femoral Neck fx  PROCEDURE:  Procedure(s): ARTHROPLASTY BIPOLAR HIP (HEMIARTHROPLASTY)  PREOPERATIVE INDICATIONS:  Savannah Harris is an 87 y.o. female who was admitted 03/03/2023 with a diagnosis of Closed displaced fracture of left femoral neck (HCC) and elected for surgical management.  The risks benefits and alternatives were discussed with the patient including but not limited to the risks of nonoperative treatment, versus surgical intervention including infection, bleeding, nerve injury, periprosthetic fracture, the need for revision surgery, dislocation, leg length discrepancy, blood clots, cardiopulmonary complications, morbidity, mortality, among others, and they were willing to proceed.  Predicted outcome is good, although there will be at least a six to nine month expected recovery.   OPERATIVE REPORT     SURGEON:  Weber Cooks, MD    ASSISTANT: Kathie Dike, PA-C (Present throughout the entire procedure,  necessary for completion of procedure in a timely manner, assisting with retraction, instrumentation, and closure)     ANESTHESIA: General  ESTIMATED BLOOD LOSS: 200cc    COMPLICATIONS:  None.       COMPONENTS:  Cemented size #3 Accolade C. [employer]ryker stem, 43 x 26 of bipolar Hemi head, 26+0 mm L fit V40 femoral head Implant Name Type Inv. Item Serial No. Manufacturer Lot No. LRB No. Used Action  CEMENT BONE SIMPLEX SPEEDSET - [id number] Cement CEMENT BONE SIMPLEX SPEEDSET  [employer] [id number] Left 2 Implanted  FEMORAL HEAD LFIT - [id number] Knees FEMORAL HEAD LFIT  [employer] [id number] Left 1 Implanted  STEM FEM CMT [id number] SZ3 - [id number] Stem STEM FEM CMT [id number] SZ3  [employer] [id number] Left 1 Implanted  UHR UNIVERSAL HEAD BIPOLAR COMPONENT    [employer] [id number] Left 1  Implanted    The aquamantis was utilized for this case to help facilitate better hemostasis as patient was felt to be at increased risk of bleeding because of preop anemia.       PROCEDURE IN DETAIL: The patient was met in the holding area and identified.  The appropriate hip  was marked at the operative site. The patient was then transported to the OR and  placed under anesthesia.  At that point, the patient was  placed in the lateral decubitus position with the operative side up and  secured to the operating room table and all bony prominences padded. A subaxillary role was placed.    The operative lower extremity was prepped from the iliac crest to the ankle.  Sterile draping was performed.  2g of ancef and 1g TXA were given prior to incision. Time out was performed prior to incision.      A routine posterolateral approach was utilized via sharp dissection  carried down to the subcutaneous tissue.  Gross bleeders were Bovie  coagulated.  The iliotibial band was identified and incised  along the length of the skin incision.  A Charnley retractor was inserted with care to protect the sciatic nerve.  With the hip internally rotated, the short external rotators  were identified. The piriformis was tagged with #2 Ethibond, and the hip capsule released in a T-type fashion, and posterior sleeve of the capsule was also tagged.  The femoral neck was exposed, and I resected the femoral neck using the appropriate jig. This was performed at approximately a thumb's breadth above the lesser trochanter.    I then exposed the deep acetabulum,  cleared out any tissue including the ligamentum teres.    I then prepared the proximal femur using the box cutter, Charnley awl, and then sequentially broached.  A trial utilized, and I reduced the hip, leg lengths were assessed clinically and felt to be equal. The hip was then taken through a full range of motion, the hip was stable at full extension and 90 degrees  external rotation without anterior subluxation. The hip was also stable in the position of sleep, and in neutral abduction up to 90 degrees flexion, and 90 degrees IR. The trial components were then removed.   We then prepared canal for cementation.  The cement restrictor was measured and inserted distally.  The canal was then irrigated with the pulse lavage and 3 L of normal saline.  2 bags of Simplex cement were prepared.  Using the cement gun the cement was inserted distally and the canal was filled.  We then pressurized the canal. The real implant was then inserted matching the patient's native anteversion of approximately 90 degrees.  We then waited for 13 minutes for the cement to be fully set.  Excess cement was removed.  A lap was placed in the acetabulum prior to cementing was also removed and the acetabulum was assessed to make sure there was no cement or bone fragments.  The hip was then reduced with the trial head again and taken through functional range of motion and found to have excellent stability. Leg lengths were restored. The real head was then impacted onto the stem and the hip was again reduced.  The capsule was then repaired with #2 Ethibond., and the piriformis was repaired to the abductor tendon. Excellent posterior capsular repair was achieved.   I then irrigated the hip copiously again with pulse lavage. The wounds were injected with 20cc exparal diluted in sterile saline. The fascia and IT band was repaired with #1 stratafix, followed by 0 stratafix for the fat layer followed by 2-0 Vicryl and running 3-0 Monocryl for the skin, Dermabond was applied and an Aquacel dressing was placed.  The patient was then awakened and returned to PACU in stable and satisfactory condition. There were no complications.  Post op recs: WB: WBAT with no formal hip precautions Abx: ancef x23 hours post op Imaging: PACU xrays Dressing: Aquacel dressing to be kept intact until follow-up DVT  prophylaxis: aspirin 81mg  BID starting POD1 x4 weeks Follow up: 2 weeks after surgery for a wound check with Dr. Blanchie Dessert at [facility].  Address: [address], [place], [place] [address]  Office Phone: [phone]   Weber Cooks, MD Orthopedic Surgeon  03/03/2023 12:37 PM

## 2023-03-03 NOTE — Assessment & Plan Note (Signed)
Continue crestor 10mg daily  

## 2023-03-03 NOTE — ED Triage Notes (Addendum)
Pt fell in bathroom. Complaining of left hip pain. Got 50 mcg of fentanyl via EMS. In C-collar. Not on thinners. No shortening or rotation noted. CSMs in tact.

## 2023-03-03 NOTE — Consult Note (Signed)
Time Arrived at the Bedside: 0830  ORTHOPAEDIC CONSULTATION  REQUESTING PHYSICIAN: Goldston, Scott, MD  Chief Complaint: Left hip pain  HPI: Savannah Harris is a 87 y.o. female who complains of left hip pain.  Patient arrived to the ED complaining of left hip pain.  States she fell around 3 AM this morning while trying to put on her diaper.  Denies lightheadedness dizziness chest pain shortness of breath or other accompanying symptoms before falling or after falling.  Hx of fracture to other hip with hardware components as well.  Patient states she falls often, and has been talking to her PCP about this.  Husband at bedside.  Denies numbness or tingling of the lower extremities.  Reports pain is strongest in the groin region.  Last meal 6 PM yesterday.  No other complaints at this time  Past Medical History:  Diagnosis Date   Arthritis    WRIST/ HANDS   Asymptomatic vertebral artery stenosis    RIGHT SIDE   Diverticulitis 11-30-13   during Christmas holiday- resolved   GERD (gastroesophageal reflux disease)    Hyperlipidemia    Hypertension    no meds   Leg cramps    OCCASIONAL   Sleep apnea 11-30-13   mild-no cpap.   Type 2 diabetes mellitus (HCC)    Urge urinary incontinence    Past Surgical History:  Procedure Laterality Date   APPENDECTOMY     at 87 years old   CATARACT EXTRACTION W/ INTRAOCULAR LENS  IMPLANT, BILATERAL     COLONOSCOPY WITH PROPOFOL N/A 12/15/2013   Procedure: COLONOSCOPY WITH PROPOFOL;  Surgeon: Martin K Johnson, MD;  Location: [facility];  Service: Endoscopy;  Laterality: N/A;   INTERSTIM IMPLANT PLACEMENT N/A 07/28/2013   Procedure: INTERSTIM IMPLANT FIRST STAGE;  Surgeon: Scott A MacDiarmid, MD;  Location: [facility];  Service: Urology;  Laterality: N/A;   INTERSTIM IMPLANT PLACEMENT N/A 07/28/2013   Procedure: INTERSTIM IMPLANT SECOND STAGE;  Surgeon: Scott A MacDiarmid, MD;  Location: [facility];  Service: Urology;   Laterality: N/A;   KYPHOPLASTY N/A 02/08/2023   Procedure: T11,L1 KYPHOPLASTY;  Surgeon: Ostergard, Thomas A, MD;  Location: [facility];  Service: Neurosurgery;  Laterality: N/A;   LEFT WRSIT TENDON REPAIR  10/15/1997   OPEN REDUCTION INTERNAL FIXATION (ORIF) DISTAL RADIAL FRACTURE Right 03/20/2019   Procedure: OPEN REDUCTION INTERNAL FIXATION (ORIF) DISTAL RADIAL FRACTURE;  Surgeon: Kuzma, Kevin, MD;  Location: [facility];  Service: Orthopedics;  Laterality: Right;  regional block   PUBOVAGINAL SLING  08/26/2007   SPARC SLING   TONSILLECTOMY AND ADENOIDECTOMY  AS CHILD   TOTAL ABDOMINAL HYSTERECTOMY W/ BILATERAL SALPINGOOPHORECTOMY  1970s   AND APPENDECTOMY   TOTAL HIP ARTHROPLASTY Right 12/16/2008   Social History   Socioeconomic History   Marital status: Married    Spouse name: Not on file   Number of children: Not on file   Years of education: Not on file   Highest education level: Not on file  Occupational History   Not on file  Tobacco Use   Smoking status: Never   Smokeless tobacco: Never  Vaping Use   Vaping Use: Never used  Substance and Sexual Activity   Alcohol use: No    Alcohol/week: 0.0 standard drinks of alcohol   Drug use: No   Sexual activity: Not Currently  Other Topics Concern   Not on file  Social History Narrative   Not on file   Social Determinants of   Health   Financial Resource Strain: Not on file  Food Insecurity: Not on file  Transportation Needs: Not on file  Physical Activity: Not on file  Stress: Not on file  Social Connections: Not on file   Family History  Problem Relation Age of Onset   Diabetes Mother    Alzheimer's disease Mother    Dementia Mother    Allergies  Allergen Reactions   Latex Other (See Comments)    BLISTERS   Sulfamethoxazole-Trimethoprim Nausea And Vomiting   Adhesive [Tape] Other (See Comments)    BLISTERS   Canagliflozin Other (See Comments)    Pt states Invokana causes dizziness   Codeine Nausea  And Vomiting     Positive ROS: All other systems have been reviewed and were otherwise negative with the exception of those mentioned in the HPI and as above.  Physical Exam: General: Alert, no acute distress Cardiovascular: No pedal edema Respiratory: No cyanosis, no use of accessory musculature Skin: No lesions in the area of chief complaint Neurologic: Sensation intact distally Psychiatric: Patient is competent for consent with normal mood and affect, known history of dementia.  MUSCULOSKELETAL: ROM of left hip deferred due to known femoral neck fracture seen on imaging.  BLE appear grossly neurovascularly intact.  Dorsiflexion plantarflexion intact.  No open wound appreciated.  Left groin TTP.  No significant swelling.  IMAGING: Left femoral neck fracture without evidence of dislocation or other osseous abnormality.  Incidentally hardware of right hip appears appropriately placed without adverse features  Assessment: Active Problems:   * No active hospital problems. *   Plan: Discussed patient case in detail with attending, Dr. Marchwiany.  Discussed with patient and spouse at bedside that examination and imaging is concerning for femoral neck fracture.  Recommend left hip hemiarthroplasty.  Discussed plan to proceed to OR today for surgical intervention.  Patient and family expressed understanding and plan to proceed with surgery today.  Patient's last meal was at 6 PM yesterday.    Ewel Lona M Leray Garverick, PA-C  Contact information:   Weekdays 7am-5pm epic message Dr. Marchwiany, or call office for patient follow up: [phone] After hours and holidays please check [email or web address] for group call information for [facility]  

## 2023-03-03 NOTE — Assessment & Plan Note (Addendum)
Secondary to fall with resultant minimally displaced left femoral neck fracture. Orthopedic surgery consulted and performed 87 year old presenting after mechanical fall at home found to have minimally displaced left femoral neck fracture. Left hemiarthroplasty on 5/19. Orthopedic surgery recommendations for weight bearing as tolerated, VTE prophylaxis including Aspirin 81 mg BID x4 weeks, keep dressing intact until outpatient follow-up and outpatient follow-up in 2 weeks.

## 2023-03-03 NOTE — ED Notes (Signed)
Patient transported to CT 

## 2023-03-03 NOTE — Assessment & Plan Note (Signed)
Patient with a history of T11/L1 compression fracture s/p kyphoplasty on 02/08/2023. X-ray significant for subacute superior endplate fractures at L2, L3, L4 and T11 in addition to a subacute fracture at the inferior endplate of T10. Neurosurgery consulted in the ED and recommended pain management. Recommend patient follows with Dr. Maurice Small as an outpatient.

## 2023-03-04 ENCOUNTER — Encounter (HOSPITAL_COMMUNITY): Payer: Self-pay | Admitting: Orthopedic Surgery

## 2023-03-04 DIAGNOSIS — S72002A Fracture of unspecified part of neck of left femur, initial encounter for closed fracture: Secondary | ICD-10-CM | POA: Diagnosis not present

## 2023-03-04 DIAGNOSIS — F0280 Dementia in other diseases classified elsewhere without behavioral disturbance: Secondary | ICD-10-CM | POA: Insufficient documentation

## 2023-03-04 LAB — BASIC METABOLIC PANEL
Anion gap: 8 (ref 5–15)
BUN: 18 mg/dL (ref 8–23)
CO2: 27 mmol/L (ref 22–32)
Calcium: 8.6 mg/dL — ABNORMAL LOW (ref 8.9–10.3)
Chloride: 101 mmol/L (ref 98–111)
Creatinine, Ser: 1.01 mg/dL — ABNORMAL HIGH (ref 0.44–1.00)
GFR, Estimated: 55 mL/min — ABNORMAL LOW (ref >60)
Glucose, Bld: 181 mg/dL — ABNORMAL HIGH (ref 70–99)
Potassium: 3.9 mmol/L (ref 3.5–5.1)
Sodium: 136 mmol/L (ref 135–145)

## 2023-03-04 LAB — CBC
HCT: 32 % — ABNORMAL LOW (ref 36.0–46.0)
Hemoglobin: 10.2 g/dL — ABNORMAL LOW (ref 12.0–15.0)
MCH: 28.6 pg (ref 26.0–34.0)
MCHC: 31.9 g/dL (ref 30.0–36.0)
MCV: 89.6 fL (ref 80.0–100.0)
Platelets: 173 10*3/uL (ref 150–400)
RBC: 3.57 MIL/uL — ABNORMAL LOW (ref 3.87–5.11)
RDW: 13.4 % (ref 11.5–15.5)
WBC: 6.3 10*3/uL (ref 4.0–10.5)
nRBC: 0 % (ref 0.0–0.2)

## 2023-03-04 LAB — GLUCOSE, CAPILLARY
Glucose-Capillary: 142 mg/dL — ABNORMAL HIGH (ref 70–99)
Glucose-Capillary: 168 mg/dL — ABNORMAL HIGH (ref 70–99)
Glucose-Capillary: 223 mg/dL — ABNORMAL HIGH (ref 70–99)
Glucose-Capillary: 187 mg/dL — ABNORMAL HIGH (ref 70–99)

## 2023-03-04 MED ORDER — ENSURE ENLIVE PO LIQD
237.0000 mL | Freq: Two times a day (BID) | ORAL | Status: DC
  Administered 2023-03-04 – 2023-03-06 (×5): 237 mL via ORAL

## 2023-03-04 MED ORDER — ADULT MULTIVITAMIN W/MINERALS CH
1.0000 | ORAL_TABLET | Freq: Every day | ORAL | Status: DC
  Administered 2023-03-04 – 2023-03-06 (×3): 1 via ORAL
  Filled 2023-03-04 (×3): qty 1

## 2023-03-04 NOTE — Progress Notes (Signed)
PROGRESS NOTE    Savannah Harris  RUE:[id number] DOB: 1936/05/29 DOA: 03/03/2023 PCP: Cleatis Polka., MD     Brief Narrative:  Savannah Harris is a 87 y.o. female with medical history significant of T2DM, HTN, HLD,  OSA, OAB, early Alzheimer's dementia who presented to ED after a fall at home. She got up to use the restroom at [facility] and as she was standing up and adjusting her adult diaper brief, she fell and hit her head and left side with immediate pain in her left hip. She was unable to get up so her husband called 911. She states pain is in her left groin. She has no numbness or tingling and sensation intact. She does have history of frequent falls with hx of fracture to other hip with ORIF.  Workup in the emergency department revealed minimally displaced left femoral neck fracture.  She underwent left hip hemiarthroplasty by Dr. Blanchie Dessert 5/19.  New events last 24 hours / Subjective: Patient feeling well overall.  She continues to have pain over her left hip as well as back pain.  Assessment & Plan:   Principal Problem:   Closed displaced fracture of left femoral neck (HCC) Active Problems:   Compression fracture of body of thoracic vertebra/lumbar   Type 2 diabetes mellitus with complication (HCC)   Essential hypertension   Hyperlipidemia   Sleep apnea   Alzheimer's dementia without behavioral disturbance (HCC)   Close displaced fracture left femoral neck -Status post left hip hemiarthroplasty by Dr. Blanchie Dessert 5/19 -PT OT -Pain control  Compression fracture thoracic vertebra/lumbar -History of T11/L1 compression fracture after fall in tub s/p kyphoplasty x 2 by Dr. Maurice Small on 02/08/2023  Lumbar xray today shows some subacute superior endplate fractures at L2, L3, L4 which are new since April. Also has subacute fractures at the inferior endplate of T10 and superior endplate of T11 which are new since April -Neurosurgery was called by EDP. Recommended pain control and  f/u outpatient in one week with Dr. Maurice Small   Diabetes mellitus type 2 -A1c 6.5 -Holding metformin, Amaryl -Sliding scale insulin  Hyperlipidemia -Crestor  Early dementia -Aricept -Delirium precaution    DVT prophylaxis:  SCDs Start: 03/03/23 0936  Code Status: Full code Family Communication: Spouse at bedside Disposition Plan: Suspect she will need SNF placement Status is: Inpatient Remains inpatient appropriate because: PT OT, placement pending    Antimicrobials:  Anti-infectives (From admission, onward)    Start     Dose/Rate Route Frequency Ordered Stop   03/03/23 1800  ceFAZolin (ANCEF) IVPB 2g/100 mL premix        2 g 200 mL/hr over 30 Minutes Intravenous Every 8 hours 03/03/23 1508 03/04/23 0001   03/03/23 0946  ceFAZolin (ANCEF) 2-4 GM/100ML-% IVPB       Note to Pharmacy: Kathrene Bongo D: cabinet override      03/03/23 0946 03/03/23 1145   03/03/23 0945  ceFAZolin (ANCEF) IVPB 2g/100 mL premix        2 g 200 mL/hr over 30 Minutes Intravenous On call to O.R. 03/03/23 0931 03/03/23 1125        Objective: Vitals:   03/03/23 1954 03/04/23 0501 03/04/23 0805 03/04/23 0825  BP: (!) 124/54 (!) 134/59 (!) 142/60   Pulse: 77 65 67   Resp: 17 17 16    Temp: 98 F (36.7 C) 98.1 F (36.7 C) 98.3 F (36.8 C)   TempSrc: Oral Oral Oral   SpO2: 98% 99% 100% 97%  Weight:  Height:        Intake/Output Summary (Last 24 hours) at 03/04/2023 1132 Last data filed at 03/04/2023 1115 Gross per 24 hour  Intake 1570 ml  Output 800 ml  Net 770 ml   Filed Weights   03/03/23 0552 03/03/23 1007  Weight: 53.5 kg 53.5 kg    Examination:  General exam: Appears calm and comfortable  Respiratory system: Clear to auscultation. Respiratory effort normal. No respiratory distress. No conversational dyspnea.  Cardiovascular system: S1 & S2 heard, RRR. No murmurs. No pedal edema. Gastrointestinal system: Abdomen is nondistended, soft and nontender. Normal bowel  sounds heard. Central nervous system: Alert and oriented. No focal neurological deficits. Speech clear.  Extremities: Symmetric in appearance  Skin: No rashes, lesions or ulcers on exposed skin  Psychiatry: Judgement and insight appear normal. Mood & affect appropriate.   Data Reviewed: I have personally reviewed following labs and imaging studies  CBC: Recent Labs  Lab 03/03/23 0636 03/04/23 0031  WBC 6.8 6.3  NEUTROABS 5.5  --   HGB 11.7* 10.2*  HCT 37.0 32.0*  MCV 90.2 89.6  PLT 159 173   Basic Metabolic Panel: Recent Labs  Lab 03/03/23 0636 03/04/23 0031  NA 142 136  K 3.8 3.9  CL 110 101  CO2 20* 27  GLUCOSE 140* 181*  BUN 22 18  CREATININE 0.81 1.01*  CALCIUM 8.7* 8.6*   GFR: Estimated Creatinine Clearance: 31 mL/min (A) (by C-G formula based on SCr of 1.01 mg/dL (H)). Liver Function Tests: Recent Labs  Lab 03/03/23 1502  AST 27  ALT 20  ALKPHOS 103  BILITOT 0.8  PROT 6.3*  ALBUMIN 3.6   No results for input(s): "LIPASE", "AMYLASE" in the last 168 hours. No results for input(s): "AMMONIA" in the last 168 hours. Coagulation Profile: Recent Labs  Lab 03/03/23 1502  INR 1.1   Cardiac Enzymes: No results for input(s): "CKTOTAL", "CKMB", "CKMBINDEX", "TROPONINI" in the last 168 hours. BNP (last 3 results) No results for input(s): "PROBNP" in the last 8760 hours. HbA1C: No results for input(s): "HGBA1C" in the last 72 hours. CBG: Recent Labs  Lab 03/03/23 1020 03/03/23 1323 03/03/23 1726 03/03/23 2313 03/04/23 0802  GLUCAP 137* 138* 299* 182* 142*   Lipid Profile: No results for input(s): "CHOL", "HDL", "LDLCALC", "TRIG", "CHOLHDL", "LDLDIRECT" in the last 72 hours. Thyroid Function Tests: No results for input(s): "TSH", "T4TOTAL", "FREET4", "T3FREE", "THYROIDAB" in the last 72 hours. Anemia Panel: No results for input(s): "VITAMINB12", "FOLATE", "FERRITIN", "TIBC", "IRON", "RETICCTPCT" in the last 72 hours. Sepsis Labs: No results for  input(s): "PROCALCITON", "LATICACIDVEN" in the last 168 hours.  Recent Results (from the past 240 hour(s))  Surgical pcr screen     Status: None   Collection Time: 03/03/23  9:19 AM   Specimen: Nasal Mucosa; Nasal Swab  Result Value Ref Range Status   MRSA, PCR NEGATIVE NEGATIVE Final   Staphylococcus aureus NEGATIVE NEGATIVE Final    Comment: (NOTE) The Xpert SA Assay (FDA approved for NASAL specimens in patients 71 years of age and older), is one component of a comprehensive surveillance program. It is not intended to diagnose infection nor to guide or monitor treatment. Performed at [facility], [address], [place], [place] [address]       Radiology Studies: DG HIP UNILAT W OR W/O PELVIS 2-3 VIEWS LEFT  Result Date: 03/04/2023 CLINICAL DATA:  Postop hip replacement. EXAM: DG HIP (WITH OR WITHOUT PELVIS) 2-3V LEFT COMPARISON:  03/03/2023 FINDINGS: Status  post left hip replacement. No evidence for immediate hardware complication. Gas in the soft tissues is compatible with the immediate postoperative state. Previous right hip replacement and sacral stimulator device evident. IMPRESSION: Status post left hip replacement without immediate hardware complication. Electronically Signed   By: Kennith Center M.D.   On: 03/04/2023 07:42   CT HEAD WO CONTRAST ( )  Result Date: 03/03/2023 CLINICAL DATA:  87 year old female with history of trauma from a fall. EXAM: CT HEAD WITHOUT CONTRAST CT CERVICAL SPINE WITHOUT CONTRAST TECHNIQUE: Multidetector CT imaging of the head and cervical spine was performed following the standard protocol without intravenous contrast. Multiplanar CT image reconstructions of the cervical spine were also generated. RADIATION DOSE REDUCTION: This exam was performed according to the departmental dose-optimization program which includes automated exposure control, adjustment of the mA and/or kV according to patient size and/or use of iterative reconstruction  technique. COMPARISON:  CT of the head and cervical spine 06/27/2022. FINDINGS: CT HEAD FINDINGS Brain: Mild cerebral atrophy. Patchy and confluent areas of decreased attenuation are noted throughout the deep and periventricular white matter of the cerebral hemispheres bilaterally, compatible with chronic microvascular ischemic disease. More well-defined area of low attenuation in the right anterior basal ganglia and left cerebellar hemisphere, compatible with old lacunar infarcts. No evidence of acute infarction, hemorrhage, hydrocephalus, extra-axial collection or mass lesion/mass effect. Vascular: No hyperdense vessel or unexpected calcification. Skull: Normal. Negative for fracture or focal lesion. Sinuses/Orbits: No acute finding. Other: None. CT CERVICAL SPINE FINDINGS Alignment: 2 mm of anterolisthesis of C5 upon C6. Alignment is otherwise anatomic. Skull base and vertebrae: No acute fracture. No primary bone lesion or focal pathologic process. Soft tissues and spinal canal: No prevertebral fluid or swelling. No visible canal hematoma. Disc levels: Mild multilevel degenerative disc disease, most evident at C6-C7. Mild multilevel facet arthropathy bilaterally. Well-defined sclerotic focus in the posterior aspect of C7 vertebral body, similar to the prior study from 2023, compatible with a benign bone island. Upper chest: Unremarkable. Other: None. IMPRESSION: 1. No evidence of significant acute traumatic injury to the skull, brain or cervical spine. 2. Mild cerebral atrophy with extensive chronic microvascular ischemic changes and old lacunar infarcts, similar to the prior study, as above. 3. Multilevel degenerative disc disease and cervical spondylosis, as above. Electronically Signed   By: Trudie Reed M.D.   On: 03/03/2023 08:27   CT CERVICAL SPINE WO CONTRAST  Result Date: 03/03/2023 CLINICAL DATA:  87 year old female with history of trauma from a fall. EXAM: CT HEAD WITHOUT CONTRAST CT CERVICAL  SPINE WITHOUT CONTRAST TECHNIQUE: Multidetector CT imaging of the head and cervical spine was performed following the standard protocol without intravenous contrast. Multiplanar CT image reconstructions of the cervical spine were also generated. RADIATION DOSE REDUCTION: This exam was performed according to the departmental dose-optimization program which includes automated exposure control, adjustment of the mA and/or kV according to patient size and/or use of iterative reconstruction technique. COMPARISON:  CT of the head and cervical spine 06/27/2022. FINDINGS: CT HEAD FINDINGS Brain: Mild cerebral atrophy. Patchy and confluent areas of decreased attenuation are noted throughout the deep and periventricular white matter of the cerebral hemispheres bilaterally, compatible with chronic microvascular ischemic disease. More well-defined area of low attenuation in the right anterior basal ganglia and left cerebellar hemisphere, compatible with old lacunar infarcts. No evidence of acute infarction, hemorrhage, hydrocephalus, extra-axial collection or mass lesion/mass effect. Vascular: No hyperdense vessel or unexpected calcification. Skull: Normal. Negative for fracture or focal lesion. Sinuses/Orbits: No acute  finding. Other: None. CT CERVICAL SPINE FINDINGS Alignment: 2 mm of anterolisthesis of C5 upon C6. Alignment is otherwise anatomic. Skull base and vertebrae: No acute fracture. No primary bone lesion or focal pathologic process. Soft tissues and spinal canal: No prevertebral fluid or swelling. No visible canal hematoma. Disc levels: Mild multilevel degenerative disc disease, most evident at C6-C7. Mild multilevel facet arthropathy bilaterally. Well-defined sclerotic focus in the posterior aspect of C7 vertebral body, similar to the prior study from 2023, compatible with a benign bone island. Upper chest: Unremarkable. Other: None. IMPRESSION: 1. No evidence of significant acute traumatic injury to the skull,  brain or cervical spine. 2. Mild cerebral atrophy with extensive chronic microvascular ischemic changes and old lacunar infarcts, similar to the prior study, as above. 3. Multilevel degenerative disc disease and cervical spondylosis, as above. Electronically Signed   By: Trudie Reed M.D.   On: 03/03/2023 08:27   DG Lumbar Spine Complete  Result Date: 03/03/2023 CLINICAL DATA:  Fall.  Recent surgery. EXAM: LUMBAR SPINE - COMPLETE 5 VIEW COMPARISON:  CT of the lumbar spine 03/01/23 FINDINGS: Spinal augmentation is present at T11 and L1. Methylmethacrylate extends into the L1-2 disc space. Subacute superior endplate fractures at L2, L3 and L4 are new since April. Small amount of methylmethacrylate extends into the disc space of T10-11. Subacute fractures at the inferior endplate of T10 and superior endplate of T11 are new since April. Vertebral body heights maintained at L5. Alignment is anatomic. Lumbar lordosis is preserved. Spinal cord stimulator noted. Sacral stimulator noted. Right total hip arthroplasty is present. Left femoral neck fracture is again noted. IMPRESSION: 1. Subacute superior endplate fractures at L2, L3 and L4 are new since April. 2. Subacute fractures at the inferior endplate of T10 and superior endplate of T11 are new since April. 3. Left femoral neck fracture. Electronically Signed   By: Marin Roberts M.D.   On: 03/03/2023 08:19   DG Chest Port 1 View  Result Date: 03/03/2023 CLINICAL DATA:  Left hip pain status post fall EXAM: PORTABLE CHEST - 1 VIEW COMPARISON:  03/19/2019 FINDINGS: Cardiomediastinal silhouette and pulmonary vasculature are within normal limits. Lungs are clear. Neurostimulator leads terminate in the midthoracic spine. Vertebral augmentation changes seen in the T11 and L1 vertebral bodies. IMPRESSION: No acute cardiopulmonary process. Electronically Signed   By: Acquanetta Belling M.D.   On: 03/03/2023 07:19   DG Pelvis Portable  Result Date:  03/03/2023 CLINICAL DATA:  Fall in bathroom.  Left hip pain. EXAM: PORTABLE PELVIS 1-2 VIEWS; DG HIP (WITH OR WITHOUT PELVIS) 1V PORT LEFT COMPARISON:  CT abdomen pelvis 10/07/2013 and pelvis radiograph 07/03/2017 FINDINGS: Left hip: Minimally displaced femoral neck fracture. Soft tissues are unremarkable. Pelvis: Visualized portions of the right total hip prosthesis are unremarkable. Neurostimulator device noted overlying the right pelvis. Severe degenerative changes of the visualized lower lumbar spine. No additional fractures or dislocations. IMPRESSION: Minimally displaced left femoral neck fracture. Electronically Signed   By: Acquanetta Belling M.D.   On: 03/03/2023 07:17   DG Hip [place] Pelvis 1 View Left  Result Date: 03/03/2023 CLINICAL DATA:  Fall in bathroom.  Left hip pain. EXAM: PORTABLE PELVIS 1-2 VIEWS; DG HIP (WITH OR WITHOUT PELVIS) 1V PORT LEFT COMPARISON:  CT abdomen pelvis 10/07/2013 and pelvis radiograph 07/03/2017 FINDINGS: Left hip: Minimally displaced femoral neck fracture. Soft tissues are unremarkable. Pelvis: Visualized portions of the right total hip prosthesis are unremarkable. Neurostimulator device noted overlying the right pelvis. Severe  degenerative changes of the visualized lower lumbar spine. No additional fractures or dislocations. IMPRESSION: Minimally displaced left femoral neck fracture. Electronically Signed   By: Acquanetta Belling M.D.   On: 03/03/2023 07:17      Scheduled Meds:  aspirin EC  81 mg Oral BID   cycloSPORINE  1 drop Both Eyes BID   donepezil  10 mg Oral QHS   insulin aspart  0-9 Units Subcutaneous TID WC   rosuvastatin  10 mg Oral QHS   sertraline  50 mg Oral Daily   Continuous Infusions:   LOS: 1 day   Time spent: 25 minutes   Noralee Stain, DO [facility] 03/04/2023, 11:32 AM   Available via Epic secure chat 7am-7pm After these hours, please refer to coverage provider listed on [email or web address]

## 2023-03-04 NOTE — Progress Notes (Signed)
Initial Nutrition Assessment  DOCUMENTATION CODES:   Not applicable  INTERVENTION:   - Ensure Enlive po BID, each supplement provides 350 kcal and 20 grams of protein  - MVI with minerals daily  - Encourage PO intake  NUTRITION DIAGNOSIS:   Increased nutrient needs related to hip fracture as evidenced by estimated needs.  GOAL:   Patient will meet greater than or equal to 90% of their needs  MONITOR:   PO intake, Supplement acceptance, Weight trends  REASON FOR ASSESSMENT:   Consult Hip fracture protocol  ASSESSMENT:   87 year old female who presented to the ED on 5/19 after a fall. PMH of T2DM, HTN, HLD, OSA, GERD, early Alzheimer's dementia. Pt admitted with minimally displaced L femoral neck fracture.  05/19 - s/p L hip hemiarthroplasty  Spoke with pt and daughter at bedside. Pt shares that her appetite is okay. She states that her husband always wants her to eat more. Pt's daughter states that pt does not eat enough protein and fiber. Pt had a chef salad for lunch today and did pretty well with it per daughter.  Discussed increased nutrient needs related to healing. Pt willing to try Ensure oral nutrition supplements this admission. She has consumed Premier Protein in the past. RD will also order daily MVI with minerals.  Pt shares that she tried to lose weight last year in anticipation of her granddaughter's wedding. Pt reports intentionally losing 18-20 lbs. Reviewed weight history in chart. Pt with a 4.6 kg weight loss since 06/27/22. This is a 7.9% weight loss in 8 months which is not clinically significant for timeframe.  Pt's daughter shares outside pt's room that pt has really declined over the last 3-4 months. Pt was diagnosed with Alzheimer's dementia and has since had 2 falls. Pt's family has been working to get her to eat more, especially protein, and focus less on her weight and weight loss.   Meal Completion: 100% x 1 documented meal  Medications  reviewed and include: SSI  Labs reviewed: creatinine 1.01 CBG's: 142-299 x 24 hours  NUTRITION - FOCUSED PHYSICAL EXAM:  Flowsheet Row Most Recent Value  Orbital Region No depletion  Upper Arm Region No depletion  Thoracic and Lumbar Region No depletion  Buccal Region No depletion  Temple Region Mild depletion  Clavicle Bone Region Mild depletion  Clavicle and Acromion Bone Region Mild depletion  Scapular Bone Region No depletion  Dorsal Hand Mild depletion  Patellar Region Mild depletion  Anterior Thigh Region Moderate depletion  Posterior Calf Region Moderate depletion  Edema (RD Assessment) None  Hair Reviewed  Eyes Reviewed  Mouth Reviewed  Skin Reviewed  Nails Reviewed       Diet Order:   Diet Order             Diet regular Room service appropriate? Yes; Fluid consistency: Thin  Diet effective now                   EDUCATION NEEDS:   Education needs have been addressed  Skin:  Skin Assessment: Reviewed RN Assessment (closed incision L hip)  Last BM:  no documented BM  Height:   Ht Readings from Last 1 Encounters:  03/03/23 4' 11.02" (1.499 m)    Weight:   Wt Readings from Last 1 Encounters:  03/03/23 53.5 kg    BMI:  Body mass index is 23.81 kg/m.  Estimated Nutritional Needs:   Kcal:  1400-1600  Protein:  65-75 grams  Fluid:  1.4-1.6 L  Mertie Clause, MS, RD, LDN Inpatient Clinical Dietitian Please see [facility] for contact information.

## 2023-03-04 NOTE — Evaluation (Signed)
Physical Therapy Evaluation Patient Details Name: Savannah Harris MRN: [id number] DOB: 25-Feb-1936 Today's Date: 03/04/2023  History of Present Illness  87 y.o. female with medical history significant of T2DM, HTN, HLD,  OSA, OAB, early Alzheimer's dementia who presented to ED after a fall at home. She got up to use the restroom at [facility] and as she was standing up and adjusting her adult diaper brief, she fell and hit her head and left side with immediate pain in her left hip. History of frequent falls with hx of fracture to other hip with ORIF.  Workup in the emergency department revealed minimally displaced left femoral neck fracture.  She underwent left hip hemiarthroplasty by Dr. Blanchie Dessert 5/19. History of T11/L1 compression fracture after fall in tub s/p kyphoplasty x 2 by Dr. Maurice Small on 02/08/2023. Husband reports that pt has been experiencing increased back pain since her surgery in April.  Clinical Impression   Pt admitted with above diagnosis. Prior to admit, pt was living with husband at home Mod I for ADL tasks and functional mobility.  Presents to PT with functional dependencies, fall risk, Difficulty walking due to LLE pain in stance; difficultty bearing weight LLE;  Needs Mod to Max asist for bed mobility, +2 mod assist for sit to stand, and +2 mod/max assist to take steps with RW; Pt currently with functional limitations due to the deficits listed below (see PT Problem List). Pt will benefit from skilled PT to increase their independence and safety with mobility to allow discharge to the venue listed below.      Patient will benefit from continued inpatient follow up therapy, <3 hours/day.      Recommendations for follow up therapy are one component of a multi-disciplinary discharge planning process, led by the attending physician.  Recommendations may be updated based on patient status, additional functional criteria and insurance authorization.  Follow Up Recommendations Can patient  physically be transported by private vehicle: No     Assistance Recommended at Discharge Frequent or constant Supervision/Assistance  Patient can return home with the following  A lot of help with walking and/or transfers;Two people to help with walking and/or transfers;Assistance with cooking/housework;Assist for transportation;Help with stairs or ramp for entrance    Equipment Recommendations Rolling walker (2 wheels);BSC/3in1;Wheelchair (measurements PT);Wheelchair cushion (measurements PT)  Recommendations for Other Services       Functional Status Assessment Patient has had a recent decline in their functional status and demonstrates the ability to make significant improvements in function in a reasonable and predictable amount of time.     Precautions / Restrictions Precautions Precautions: Back;Fall Required Braces or Orthoses: Spinal Brace Spinal Brace: Thoracolumbosacral orthotic (in room) Restrictions LLE Weight Bearing: Weight bearing as tolerated      Mobility  Bed Mobility                 Patient Response: Anxious, Cooperative  Transfers Overall transfer level: Needs assistance Equipment used: Rolling walker (2 wheels) Transfers: Sit to/from Stand, Bed to chair/wheelchair/BSC Sit to Stand: Mod assist, +2 physical assistance, From elevated surface   Step pivot transfers: Max assist       General transfer comment: Increased time provided to complete. Max VC and visual cues provided for sequencing. Manual faciliation provided to weight shift onto LLE in order to lateral step with right foot.    Ambulation/Gait Ambulation/Gait assistance: +2 physical assistance, Mod assist, Max assist Gait Distance (Feet): 2 Feet Assistive device: Rolling walker (2 wheels) Gait Pattern/deviations: Decreased step length -  right, Decreased step length - left, Decreased stance time - left       General Gait Details: Took 3 strides forward with RW and multimodal cueing  for sequence, posture, and for weight shift onto LLE to allow for advancement RLE; initially noteworthy that when pt was able to move R foot, it was stepping backwards; difficulty keeping weight and center of mass forward  Stairs            Wheelchair Mobility    Modified Rankin (Stroke Patients Only)       Balance Overall balance assessment: History of Falls, Needs assistance Sitting-balance support: Bilateral upper extremity supported, Feet supported Sitting balance-Leahy Scale: Poor Sitting balance - Comments: sitting EOB. Required VC to place more weight into left hip to achieve midline and upright sitting posture. Postural control: Right lateral lean   Standing balance-Leahy Scale: Zero Standing balance comment: Shifted between Poor and Zero during session. With max VC to push into RW with arms, look up and forward, squeeze buttocks pt was able to make adjustments to her standing posture although unable to sustain appropriate upright posture. Demonstrated increased fatigue and low activity tolerance.                             Pertinent Vitals/Pain Pain Assessment Pain Assessment: 0-10 Pain Score: 9  Pain Location: back and left hip/leg Pain Descriptors / Indicators: Grimacing, Guarding, Discomfort, Moaning Pain Intervention(s): Monitored during session, Limited activity within patient's tolerance    Home Living Family/patient expects to be discharged to:: Private residence Living Arrangements: Spouse/significant other Available Help at Discharge:  (husband is unable to physically assist. Daughter-in-law lives in [place] and could help out intermittently) Type of Home: House Home Access: Stairs to enter Entrance Stairs-Rails: Right;Left;Can reach both (garage entrance) Entrance Stairs-Number of Steps: 4 front entrance 3 garage entrance   Home Layout: One level Home Equipment: Cane - single point;Rollator (4 wheels);Shower seat - built in;Grab bars -  tub/shower;Grab bars - toilet Additional Comments: grab bar in primary bathroom    Prior Function Prior Level of Function : Independent/Modified Independent;History of Falls (last six months)             Mobility Comments: Recent fall in April sustaining T11/L1 compression fracture. s/p kyphoplasty x 2 by Dr. Maurice Small. ADLs Comments: Mod I     Hand Dominance   Dominant Hand: Right    Extremity/Trunk Assessment   Upper Extremity Assessment Upper Extremity Assessment: Defer to OT evaluation    Lower Extremity Assessment Lower Extremity Assessment: Generalized weakness;LLE deficits/detail LLE Deficits / Details: Grossly decr AROM and strength, limited by pain from fracture and surgical repair; decr abiltiy to accpet weight on LLE in stance    Cervical / Trunk Assessment Cervical / Trunk Assessment: Back Surgery  Communication   Communication: No difficulties;HOH (has hearing aides)  Cognition Arousal/Alertness: Awake/alert Behavior During Therapy: WFL for tasks assessed/performed Overall Cognitive Status: History of cognitive impairments - at baseline                                 General Comments: history of earler Alzheimer's.        General Comments General comments (skin integrity, edema, etc.): VSS on RA. SpO2 91-92% on RA    Exercises     Assessment/Plan    PT Assessment Patient needs continued PT services  PT Problem  List Decreased strength;Decreased range of motion;Decreased activity tolerance;Decreased balance;Decreased mobility;Decreased coordination;Decreased cognition;Decreased knowledge of use of DME;Decreased safety awareness;Decreased knowledge of precautions;Pain       PT Treatment Interventions DME instruction;Gait training;Functional mobility training;Therapeutic activities;Therapeutic exercise;Balance training;Neuromuscular re-education;Cognitive remediation;Patient/family education;Wheelchair mobility training    PT Goals  (Current goals can be found in the Care Plan section)  Acute Rehab PT Goals Patient Stated Goal: Wants to get better and be able to walk PT Goal Formulation: With patient/family Time For Goal Achievement: 03/18/23 Potential to Achieve Goals: Fair    Frequency Min 4X/week     Co-evaluation PT/OT/SLP Co-Evaluation/Treatment: Yes Reason for Co-Treatment: To address functional/ADL transfers PT goals addressed during session: Mobility/safety with mobility         AM-PAC PT "6 Clicks" Mobility  Outcome Measure Help needed turning from your back to your side while in a flat bed without using bedrails?: A Lot Help needed moving from lying on your back to sitting on the side of a flat bed without using bedrails?: Total Help needed moving to and from a bed to a chair (including a wheelchair)?: Total Help needed standing up from a chair using your arms (e.g., wheelchair or bedside chair)?: Total Help needed to walk in hospital room?: Total Help needed climbing 3-5 steps with a railing? : Total 6 Click Score: 7    End of Session Equipment Utilized During Treatment: Gait belt;Back brace Activity Tolerance: Patient limited by pain;Patient limited by fatigue Patient left: in chair;with call bell/phone within reach;with chair alarm set Nurse Communication: Mobility status PT Visit Diagnosis: Unsteadiness on feet (R26.81);Other abnormalities of gait and mobility (R26.89);History of falling (Z91.81);Muscle weakness (generalized) (M62.81);Pain Pain - Right/Left: Left Pain - part of body: Hip    Time: 1139-1208 PT Time Calculation (min) (ACUTE ONLY): 29 min   Charges:   PT Evaluation $PT Eval Moderate Complexity: 1 Mod          Van Clines, PT  Acute Rehabilitation Services Office [phone] Secure Chat welcomed   Levi Aland 03/04/2023, 3:21 PM

## 2023-03-04 NOTE — Plan of Care (Signed)
Problem: Education: Goal: Ability to describe self-care measures that may prevent or decrease complications (Diabetes Survival Skills Education) will improve 03/04/2023 1855 by Johnney Killian, RN Outcome: Progressing 03/04/2023 1528 by Johnney Killian, RN Outcome: Progressing Goal: Individualized Educational Video(s) 03/04/2023 1855 by Johnney Killian, RN Outcome: Progressing 03/04/2023 1528 by Johnney Killian, RN Outcome: Progressing   Problem: Coping: Goal: Ability to adjust to condition or change in health will improve 03/04/2023 1855 by Johnney Killian, RN Outcome: Progressing 03/04/2023 1528 by Johnney Killian, RN Outcome: Progressing   Problem: Fluid Volume: Goal: Ability to maintain a balanced intake and output will improve 03/04/2023 1855 by Johnney Killian, RN Outcome: Progressing 03/04/2023 1528 by Johnney Killian, RN Outcome: Progressing   Problem: Health Behavior/Discharge Planning: Goal: Ability to identify and utilize available resources and services will improve 03/04/2023 1855 by Johnney Killian, RN Outcome: Progressing 03/04/2023 1528 by Johnney Killian, RN Outcome: Progressing Goal: Ability to manage health-related needs will improve 03/04/2023 1855 by Johnney Killian, RN Outcome: Progressing 03/04/2023 1528 by Johnney Killian, RN Outcome: Progressing   Problem: Metabolic: Goal: Ability to maintain appropriate glucose levels will improve 03/04/2023 1855 by Johnney Killian, RN Outcome: Progressing 03/04/2023 1528 by Johnney Killian, RN Outcome: Progressing   Problem: Nutritional: Goal: Maintenance of adequate nutrition will improve 03/04/2023 1855 by Johnney Killian, RN Outcome: Progressing 03/04/2023 1528 by Johnney Killian, RN Outcome: Progressing Goal: Progress toward achieving an optimal weight will improve 03/04/2023 1855 by Johnney Killian, RN Outcome: Progressing 03/04/2023 1528 by Johnney Killian, RN Outcome: Progressing   Problem: Skin  Integrity: Goal: Risk for impaired skin integrity will decrease 03/04/2023 1855 by Johnney Killian, RN Outcome: Progressing 03/04/2023 1528 by Johnney Killian, RN Outcome: Progressing   Problem: Tissue Perfusion: Goal: Adequacy of tissue perfusion will improve 03/04/2023 1855 by Johnney Killian, RN Outcome: Progressing 03/04/2023 1528 by Johnney Killian, RN Outcome: Progressing   Problem: Education: Goal: Knowledge of General Education information will improve Description: Including pain rating scale, medication(s)/side effects and non-pharmacologic comfort measures 03/04/2023 1855 by Johnney Killian, RN Outcome: Progressing 03/04/2023 1528 by Johnney Killian, RN Outcome: Progressing   Problem: Health Behavior/Discharge Planning: Goal: Ability to manage health-related needs will improve 03/04/2023 1855 by Johnney Killian, RN Outcome: Progressing 03/04/2023 1528 by Johnney Killian, RN Outcome: Progressing   Problem: Clinical Measurements: Goal: Ability to maintain clinical measurements within normal limits will improve 03/04/2023 1855 by Johnney Killian, RN Outcome: Progressing 03/04/2023 1528 by Johnney Killian, RN Outcome: Progressing Goal: Will remain free from infection 03/04/2023 1855 by Johnney Killian, RN Outcome: Progressing 03/04/2023 1528 by Johnney Killian, RN Outcome: Progressing Goal: Diagnostic test results will improve 03/04/2023 1855 by Johnney Killian, RN Outcome: Progressing 03/04/2023 1528 by Johnney Killian, RN Outcome: Progressing Goal: Respiratory complications will improve 03/04/2023 1855 by Johnney Killian, RN Outcome: Progressing 03/04/2023 1528 by Johnney Killian, RN Outcome: Progressing Goal: Cardiovascular complication will be avoided 03/04/2023 1855 by Johnney Killian, RN Outcome: Progressing 03/04/2023 1528 by Johnney Killian, RN Outcome: Progressing   Problem: Activity: Goal: Risk for activity intolerance will decrease 03/04/2023 1855 by Johnney Killian, RN Outcome: Progressing 03/04/2023 1528 by Johnney Killian, RN Outcome: Progressing   Problem: Nutrition: Goal: Adequate nutrition will be maintained 03/04/2023 1855 by Johnney Killian, RN Outcome: Progressing 03/04/2023 1528 by Johnney Killian, RN Outcome: Progressing  Problem: Coping: Goal: Level of anxiety will decrease 03/04/2023 1855 by Johnney Killian, RN Outcome: Progressing 03/04/2023 1528 by Johnney Killian, RN Outcome: Progressing   Problem: Elimination: Goal: Will not experience complications related to bowel motility 03/04/2023 1855 by Johnney Killian, RN Outcome: Progressing 03/04/2023 1528 by Johnney Killian, RN Outcome: Progressing Goal: Will not experience complications related to urinary retention 03/04/2023 1855 by Johnney Killian, RN Outcome: Progressing 03/04/2023 1528 by Johnney Killian, RN Outcome: Progressing   Problem: Pain Managment: Goal: General experience of comfort will improve 03/04/2023 1855 by Johnney Killian, RN Outcome: Progressing 03/04/2023 1528 by Johnney Killian, RN Outcome: Progressing   Problem: Safety: Goal: Ability to remain free from injury will improve 03/04/2023 1855 by Johnney Killian, RN Outcome: Progressing 03/04/2023 1528 by Johnney Killian, RN Outcome: Progressing   Problem: Skin Integrity: Goal: Risk for impaired skin integrity will decrease 03/04/2023 1855 by Johnney Killian, RN Outcome: Progressing 03/04/2023 1528 by Johnney Killian, RN Outcome: Progressing

## 2023-03-04 NOTE — Plan of Care (Signed)

## 2023-03-04 NOTE — Evaluation (Signed)
Occupational Therapy Evaluation Patient Details Name: ASENCION DEMEESTER MRN: [id number] DOB: Sep 10, 1936 Today's Date: 03/04/2023   History of Present Illness 87 y.o. female with medical history significant of T2DM, HTN, HLD,  OSA, OAB, early Alzheimer's dementia who presented to ED after a fall at home. She got up to use the restroom at [facility] and as she was standing up and adjusting her adult diaper brief, she fell and hit her head and left side with immediate pain in her left hip. History of frequent falls with hx of fracture to other hip with ORIF.  Workup in the emergency department revealed minimally displaced left femoral neck fracture.  She underwent left hip hemiarthroplasty by Dr. Blanchie Dessert 5/19. History of T11/L1 compression fracture after fall in tub s/p kyphoplasty x 2 by Dr. Maurice Small on 02/08/2023. Husband reports that pt has been experiencing increased back pain since her surgery in April.   Clinical Impression   Pt admitted with the above diagnosis. Pt currently with functional limitations due to the deficits listed below (see OT Problem List). Prior to admit, pt was living with husband at home Mod I for BADL tasks and functional mobility.  Pt will benefit from acute skilled OT to increase their safety and independence with ADL and functional mobility for ADL to facilitate discharge. Patient will benefit from continued inpatient follow up therapy, <3 hours/day. OT will continue to follow patient acutely.         Recommendations for follow up therapy are one component of a multi-disciplinary discharge planning process, led by the attending physician.  Recommendations may be updated based on patient status, additional functional criteria and insurance authorization.   Assistance Recommended at Discharge Frequent or constant Supervision/Assistance  Patient can return home with the following A lot of help with walking and/or transfers;A lot of help with bathing/dressing/bathroom;Assistance  with cooking/housework;Assist for transportation;Help with stairs or ramp for entrance    Functional Status Assessment  Patient has had a recent decline in their functional status and demonstrates the ability to make significant improvements in function in a reasonable and predictable amount of time.  Equipment Recommendations  Other (comment) (defer to next venue of care)       Precautions / Restrictions Precautions Precautions: Back Restrictions Weight Bearing Restrictions: Yes LLE Weight Bearing: Weight bearing as tolerated      Mobility Bed Mobility Overal bed mobility: Needs Assistance Bed Mobility: Sidelying to Sit, Rolling Rolling: Mod assist Sidelying to sit: HOB elevated, Mod assist       General bed mobility comments: VC for log roll technique and to use bed rail on right side. VC for sequencing and steps with increased time due to pain provided. Patient Response: Anxious, Cooperative  Transfers Overall transfer level: Needs assistance Equipment used: Rolling walker (2 wheels) Transfers: Sit to/from Stand, Bed to chair/wheelchair/BSC Sit to Stand: Mod assist, +2 physical assistance, From elevated surface     Step pivot transfers: Max assist     General transfer comment: Increased time provided to complete. Max VC and visual cues provided for sequencing. Manual faciliation provided to weight shift onto LLE in order to lateral step with right foot.      Balance Overall balance assessment: History of Falls, Needs assistance Sitting-balance support: Bilateral upper extremity supported, Feet supported Sitting balance-Leahy Scale: Poor Sitting balance - Comments: sitting EOB. Required VC to place more weight into left hip to achieve midline and upright sitting posture. Postural control: Right lateral lean   Standing balance-Leahy Scale: Zero Standing  balance comment: Shifted between Poor and Zero during session. With max VC to push into RW with arms, look up and  forward, squeeze buttocks pt was able to make adjustments to her standing posture although unable to sustain appropriate upright posture. Demonstrated increased fatigue and low activity tolerance.            ADL either performed or assessed with clinical judgement   ADL Overall ADL's : Needs assistance/impaired     Grooming: Wash/dry hands;Wash/dry face;Oral care;Brushing hair;Applying deodorant;Set up;Bed level   Upper Body Bathing: Minimal assistance;Bed level   Lower Body Bathing: Total assistance;Bed level   Upper Body Dressing : Minimal assistance;Sitting   Lower Body Dressing: Total assistance;Bed level   Toilet Transfer: Maximal assistance;Stand-pivot;Rolling walker (2 wheels);BSC/3in1   Toileting- Clothing Manipulation and Hygiene: Total assistance;Sit to/from stand;Sitting/lateral lean;Cueing for sequencing               Vision Baseline Vision/History: 1 Wears glasses (reading) Ability to See in Adequate Light: 0 Adequate Patient Visual Report: No change from baseline Vision Assessment?: No apparent visual deficits            Pertinent Vitals/Pain Pain Assessment Pain Assessment: 0-10 Pain Score: 9  Pain Location: back and left hip/leg Pain Descriptors / Indicators: Grimacing, Guarding, Discomfort, Moaning Pain Intervention(s): Limited activity within patient's tolerance, Monitored during session, Repositioned, Patient requesting pain meds-RN notified, RN gave pain meds during session     Hand Dominance Right   Extremity/Trunk Assessment Upper Extremity Assessment Upper Extremity Assessment: Generalized weakness   Lower Extremity Assessment Lower Extremity Assessment: Defer to PT evaluation   Cervical / Trunk Assessment Cervical / Trunk Assessment: Back Surgery   Communication Communication Communication: No difficulties;HOH (has hearing aides)   Cognition Arousal/Alertness: Awake/alert Behavior During Therapy: WFL for tasks  assessed/performed Overall Cognitive Status: History of cognitive impairments - at baseline          General Comments: history of earler Alzheimer's.     General Comments  VSS on RA. SpO2 91-92% on RA            Home Living Family/patient expects to be discharged to:: Private residence Living Arrangements: Spouse/significant other Available Help at Discharge:  (husband is unable to physically assist. Daughter-in-law lives in [place] and could help out intermittently) Type of Home: House Home Access: Stairs to enter [employer] of Steps: 4 front entrance 3 garage entrance Entrance Stairs-Rails: Right;Left;Can reach both (garage entrance) Home Layout: One level     Bathroom Shower/Tub: Producer, television/film/video: Handicapped height     Home Equipment: Cane - single point;Rollator (4 wheels);Shower seat - built in;Grab bars - tub/shower;Grab bars - toilet   Additional Comments: grab bar in primary bathroom      Prior Functioning/Environment Prior Level of Function : Independent/Modified Independent;History of Falls (last six months)       Mobility Comments: Recent fall in April sustaining T11/L1 compression fracture. s/p kyphoplasty x 2 by Dr. Maurice Small. ADLs Comments: Mod I        OT Problem List: Decreased strength;Pain;Decreased safety awareness;Decreased activity tolerance;Impaired balance (sitting and/or standing);Decreased knowledge of use of DME or AE;Decreased knowledge of precautions      OT Treatment/Interventions: Self-care/ADL training;Therapeutic exercise;Therapeutic activities;Neuromuscular education;Energy conservation;DME and/or AE instruction;Patient/family education;Balance training;Manual therapy;Modalities    OT Goals(Current goals can be found in the care plan section) Acute Rehab OT Goals Patient Stated Goal: to decrease pain OT Goal Formulation: Patient unable to participate in goal setting Time  For Goal Achievement:  03/18/23 Potential to Achieve Goals: Good  OT Frequency: Min 2X/week    Co-evaluation PT/OT/SLP Co-Evaluation/Treatment: Yes (dovetail) Reason for Co-Treatment: To address functional/ADL transfers   OT goals addressed during session: Strengthening/ROM;Proper use of Adaptive equipment and DME;ADL's and self-care      AM-PAC OT "6 Clicks" Daily Activity     Outcome Measure Help from another person eating meals?: None Help from another person taking care of personal grooming?: A Little Help from another person toileting, which includes using toliet, bedpan, or urinal?: Total (pure wick) Help from another person bathing (including washing, rinsing, drying)?: Total Help from another person to put on and taking off regular upper body clothing?: A Little Help from another person to put on and taking off regular lower body clothing?: Total 6 Click Score: 13   End of Session Equipment Utilized During Treatment: Gait belt;Rolling walker (2 wheels) Nurse Communication: Patient requests pain meds  Activity Tolerance: Patient limited by pain Patient left: in chair;with call bell/phone within reach;with family/visitor present;Other (comment) (with PT and rehab tech)  OT Visit Diagnosis: Unsteadiness on feet (R26.81);Pain;History of falling (Z91.81);Muscle weakness (generalized) (M62.81) Pain - Right/Left: Left Pain - part of body: Leg (back)                Time: 1610-9604 OT Time Calculation (min): 48 min Charges:  OT General Charges $OT Visit: 1 Visit OT Evaluation $OT Eval High Complexity: 1 High OT Treatments $Therapeutic Activity: 8-22 mins  Limmie Patricia, OTR/L,CBIS  Supplemental OT - [facility] and [facility] Secure Chat Preferred    Hetal Proano, Charisse March 03/04/2023, 12:26 PM

## 2023-03-04 NOTE — Progress Notes (Signed)
     Subjective: Patient reports pain as mild.  Did well with physical therapy mobilizing to the chair.  Limited by hip and back pain.  Denies distal numbness and tingling.  No acute issues.  Objective:   VITALS:   Vitals:   03/04/23 0501 03/04/23 0805 03/04/23 0825 03/04/23 1542  BP: (!) 134/59 (!) 142/60  (!) 131/54  Pulse: 65 67  81  Resp: 17 16  16   Temp: 98.1 F (36.7 C) 98.3 F (36.8 C)  98 F (36.7 C)  TempSrc: Oral Oral  Oral  SpO2: 99% 100% 97% 94%  Weight:      Height:        Sensation intact distally Intact pulses distally Dorsiflexion/Plantar flexion intact Incision: dressing C/D/I Compartment soft    Lab Results  Component Value Date   WBC 6.3 03/04/2023   HGB 10.2 (L) 03/04/2023   HCT 32.0 (L) 03/04/2023   MCV 89.6 03/04/2023   PLT 173 03/04/2023   BMET    Component Value Date/Time   NA 136 03/04/2023 0031   K 3.9 03/04/2023 0031   CL 101 03/04/2023 0031   CO2 27 03/04/2023 0031   GLUCOSE 181 (H) 03/04/2023 0031   BUN 18 03/04/2023 0031   CREATININE 1.01 (H) 03/04/2023 0031   CALCIUM 8.6 (L) 03/04/2023 0031   GFRNONAA 55 (L) 03/04/2023 0031   Xray: Left hip arthroplasty cemented good position no adverse features  Assessment/Plan: 1 Day Post-Op   Principal Problem:   Closed displaced fracture of left femoral neck (HCC) Active Problems:   Essential hypertension   Hyperlipidemia   Type 2 diabetes mellitus with complication (HCC)   Sleep apnea   Compression fracture of body of thoracic vertebra/lumbar   Alzheimer's dementia without behavioral disturbance (HCC)  Status post left hip Hemi arthroplasty for femoral neck fracture 03/03/2023   Post op recs: WB: WBAT with no formal hip precautions Abx: ancef x23 hours post op Imaging: PACU xrays Dressing: Aquacel dressing to be kept intact until follow-up DVT prophylaxis: aspirin 81mg  BID starting POD1 x4 weeks Follow up: 2 weeks after surgery for a wound check with Dr. Blanchie Dessert at  [facility].  Address: [address], [place], [place] [address]  Office Phone: [phone]    Joen Laura 03/04/2023, 5:45 PM   Weber Cooks, MD  Contact information:   [phone] 7am-5pm epic message Dr. Blanchie Dessert, or call office for patient follow up: [phone] After hours and holidays please check [email or web address] for group call information for [facility]

## 2023-03-04 NOTE — TOC CAGE-AID Note (Signed)
Transition of Care [facility]) - CAGE-AID Screening  Patient Details  Name: Savannah Harris MRN: [id number] Date of Birth: 1936/03/17  Clinical Narrative:  Patient denies alcohol or drug use, no need for substance abuse resources at this time.  CAGE-AID Screening:   Have You Ever Felt You Ought to Cut Down on Your Drinking or Drug Use?: No Have People Annoyed You By Critizing Your Drinking Or Drug Use?: No Have You Felt Bad Or Guilty About Your Drinking Or Drug Use?: No Have You Ever Had a Drink or Used Drugs First Thing In The Morning to Steady Your Nerves or to Get Rid of a Hangover?: No CAGE-AID Score: 0  Substance Abuse Education Offered: No

## 2023-03-05 DIAGNOSIS — S72002A Fracture of unspecified part of neck of left femur, initial encounter for closed fracture: Secondary | ICD-10-CM | POA: Diagnosis not present

## 2023-03-05 LAB — GLUCOSE, CAPILLARY
Glucose-Capillary: 159 mg/dL — ABNORMAL HIGH (ref 70–99)
Glucose-Capillary: 248 mg/dL — ABNORMAL HIGH (ref 70–99)
Glucose-Capillary: 202 mg/dL — ABNORMAL HIGH (ref 70–99)
Glucose-Capillary: 268 mg/dL — ABNORMAL HIGH (ref 70–99)

## 2023-03-05 NOTE — TOC Initial Note (Signed)
Transition of Care [facility]) - Initial/Assessment Note    Patient Details  Name: Savannah Harris MRN: [id number] Date of Birth: 1936-04-22  Transition of Care [facility]) CM/SW Contact:    Carley Hammed, LCSW Phone Number: 03/05/2023, 12:50 PM  Clinical Narrative:                  CSW spoke with pt and spouse at bedside and advised them of recommendation for SNF at DC. Pt and spouse are agreeable and spouse states he was able to care for her last time, but cannot do so this time. Pt and spouse have a preference for [facility], but are agreeable to a fax out in the are. They would like to be in [place]. CSW completed referral and fax out. TOC will continue to follow for DC needs.  Expected Discharge Plan: Skilled Nursing Facility Barriers to Discharge: Continued Medical Work up, English as a second language teacher, SNF Pending bed offer   Patient Goals and CMS Choice Patient states their goals for this hospitalization and ongoing recovery are:: Pt wants to be more independent and return home after rehab. CMS Medicare.gov Compare Post Acute Care list provided to:: Patient Choice offered to / list presented to : Patient      Expected Discharge Plan and Services     Post Acute Care Choice: Skilled Nursing Facility Living arrangements for the past 2 months: Single Family Home                                      Prior Living Arrangements/Services Living arrangements for the past 2 months: Single Family Home Lives with:: Spouse Patient language and need for interpreter reviewed:: Yes Do you feel safe going back to the place where you live?: Yes      Need for Family Participation in Patient Care: No (Comment) Care giver support system in place?: Yes (comment)   Criminal Activity/Legal Involvement Pertinent to Current Situation/Hospitalization: No - Comment as needed  Activities of Daily Living Home Assistive Devices/Equipment: [place] (specify type) ADL Screening (condition at time of  admission) Patient's cognitive ability adequate to safely complete daily activities?: Yes Is the patient deaf or have difficulty hearing?: Yes (hearing aid at home) Does the patient have difficulty seeing, even when wearing glasses/contacts?: No Does the patient have difficulty concentrating, remembering, or making decisions?: Yes Patient able to express need for assistance with ADLs?: Yes Does the patient have difficulty dressing or bathing?: Yes Independently performs ADLs?: Yes (appropriate for developmental age) Does the patient have difficulty walking or climbing stairs?: Yes Weakness of Legs: Both Weakness of Arms/Hands: Both  Permission Sought/Granted Permission sought to share information with : Family Supports Permission granted to share information with : Yes, Verbal Permission Granted  Share Information with NAME: Chrissie Noa     Permission granted to share info w Relationship: Spouse     Emotional Assessment Appearance:: Appears stated age Attitude/Demeanor/Rapport: Engaged Affect (typically observed): Appropriate Orientation: : Oriented to Self, Oriented to Place, Oriented to  Time, Oriented to Situation Alcohol / Substance Use: Not Applicable Psych Involvement: No (comment)  Admission diagnosis:  Closed fracture of left hip, initial encounter (HCC) [S72.002A] Closed displaced fracture of left femoral neck (HCC) [S72.002A] Patient Active Problem List   Diagnosis Date Noted   Alzheimer's dementia without behavioral disturbance (HCC) 03/04/2023   Compression fracture of body of thoracic vertebra/lumbar 03/03/2023   Closed displaced fracture of left femoral neck (  HCC) 03/03/2023   Sleep apnea 02/22/2017   Memory loss 10/23/2014   Essential hypertension 10/23/2014   Hyperlipidemia 10/23/2014   Type 2 diabetes mellitus with complication (HCC) 10/23/2014   Diastasis recti 03/02/2013   Constipation, chronic 03/02/2013   PCP:  Cleatis Polka., MD Pharmacy:    [facility] [phone] - [place], [place] - [address] AT [facility] [address] [place] [address] Phone: [phone] Fax: [phone]     Social Determinants of Health (SDOH) Social History: SDOH Screenings   Food Insecurity: No Food Insecurity (03/03/2023)  Housing: Patient Declined (03/03/2023)  Transportation Needs: No Transportation Needs (03/03/2023)  Utilities: Not At Risk (03/03/2023)  Tobacco Use: Low Risk  (03/04/2023)   SDOH Interventions:     Readmission Risk Interventions     No data to display

## 2023-03-05 NOTE — Progress Notes (Signed)
     Subjective: Patient reports improved mobility and pain control about the left hip.  Worked well with physical therapy today.  Her and daughter pleased with her progress.  Limited by back pain.  Currently upright in the recliner wearing her back brace.  Objective:   VITALS:   Vitals:   03/04/23 2048 03/05/23 0438 03/05/23 0844 03/05/23 1625  BP: (!) 125/91 (!) 144/61 135/74 (!) 132/51  Pulse: 77 70 71 72  Resp: 17 16 16 16   Temp: 98.9 F (37.2 C) 98.2 F (36.8 C) 97.6 F (36.4 C) 97.9 F (36.6 C)  TempSrc: Oral Oral  Oral  SpO2: 99% 95% 98% 99%  Weight:      Height:        Sensation intact distally Intact pulses distally Dorsiflexion/Plantar flexion intact Incision: dressing C/D/I Compartment soft    Lab Results  Component Value Date   WBC 6.3 03/04/2023   HGB 10.2 (L) 03/04/2023   HCT 32.0 (L) 03/04/2023   MCV 89.6 03/04/2023   PLT 173 03/04/2023   BMET    Component Value Date/Time   NA 136 03/04/2023 0031   K 3.9 03/04/2023 0031   CL 101 03/04/2023 0031   CO2 27 03/04/2023 0031   GLUCOSE 181 (H) 03/04/2023 0031   BUN 18 03/04/2023 0031   CREATININE 1.01 (H) 03/04/2023 0031   CALCIUM 8.6 (L) 03/04/2023 0031   GFRNONAA 55 (L) 03/04/2023 0031   Xray: Left hip arthroplasty cemented good position no adverse features  Assessment/Plan: 2 Days Post-Op   Principal Problem:   Closed displaced fracture of left femoral neck (HCC) Active Problems:   Essential hypertension   Hyperlipidemia   Type 2 diabetes mellitus with complication (HCC)   Sleep apnea   Compression fracture of body of thoracic vertebra/lumbar   Alzheimer's dementia without behavioral disturbance (HCC)  Status post left hip Hemi arthroplasty for femoral neck fracture 03/03/2023   Post op recs: WB: WBAT with no formal hip precautions Abx: ancef x23 hours post op Imaging: PACU xrays Dressing: Aquacel dressing to be kept intact until follow-up DVT prophylaxis: aspirin 81mg  BID starting  POD1 x4 weeks Follow up: 2 weeks after surgery for a wound check with Dr. Blanchie Dessert at [facility].  Address: [address], [place], [place] [address]  Office Phone: [phone]    Joen Laura 03/05/2023, 5:26 PM   Weber Cooks, MD  Contact information:   [phone] 7am-5pm epic message Dr. Blanchie Dessert, or call office for patient follow up: [phone] After hours and holidays please check [email or web address] for group call information for [facility]

## 2023-03-05 NOTE — Progress Notes (Addendum)
PROGRESS NOTE    Savannah JOURDEN  Harris:[id number] DOB: Nov 30, 1935 DOA: 03/03/2023 PCP: Cleatis Polka., MD     Brief Narrative:  Savannah Harris is a 87 y.o. female with medical history significant of T2DM, HTN, HLD,  OSA, OAB, early Alzheimer's dementia who presented to ED after a fall at home. She got up to use the restroom at [facility] and as she was standing up and adjusting her adult diaper brief, she fell and hit her head and left side with immediate pain in her left hip. She was unable to get up so her husband called 911. She states pain is in her left groin. She has no numbness or tingling and sensation intact. She does have history of frequent falls with hx of fracture to other hip with ORIF.  Workup in the emergency department revealed minimally displaced left femoral neck fracture.  She underwent left hip hemiarthroplasty by Dr. Blanchie Dessert 5/19.  New events last 24 hours / Subjective: No new complaints on examination today  Assessment & Plan:   Principal Problem:   Closed displaced fracture of left femoral neck (HCC) Active Problems:   Compression fracture of body of thoracic vertebra/lumbar   Type 2 diabetes mellitus with complication (HCC)   Essential hypertension   Hyperlipidemia   Sleep apnea   Alzheimer's dementia without behavioral disturbance (HCC)   Close displaced fracture left femoral neck -Status post left hip hemiarthroplasty by Dr. Blanchie Dessert 5/19 -PT OT, recommended for SNF placement -Pain control  Compression fracture thoracic vertebra/lumbar -History of T11/L1 compression fracture after fall in tub s/p kyphoplasty x 2 by Dr. Maurice Small on 02/08/2023  -Lumbar xray shows some subacute superior endplate fractures at L2, L3, L4 which are new since April. Also has subacute fractures at the inferior endplate of T10 and superior endplate of T11 which are new since April -Neurosurgery was called by EDP. Recommended pain control and f/u outpatient in one week with  Dr. Maurice Small   Diabetes mellitus type 2 -A1c 6.5 -Holding metformin, Amaryl -Sliding scale insulin  Hyperlipidemia -Crestor  Early dementia -Aricept -Delirium precaution    DVT prophylaxis:  SCDs Start: 03/03/23 0936  Code Status: Full code Family Communication: No family at bedside today Disposition Plan: SNF Status is: Inpatient Remains inpatient appropriate because: SNF pending    Antimicrobials:  Anti-infectives (From admission, onward)    Start     Dose/Rate Route Frequency Ordered Stop   03/03/23 1800  ceFAZolin (ANCEF) IVPB 2g/100 mL premix        2 g 200 mL/hr over 30 Minutes Intravenous Every 8 hours 03/03/23 1508 03/04/23 0001   03/03/23 0946  ceFAZolin (ANCEF) 2-4 GM/100ML-% IVPB       Note to Pharmacy: Kathrene Bongo D: cabinet override      03/03/23 0946 03/03/23 1145   03/03/23 0945  ceFAZolin (ANCEF) IVPB 2g/100 mL premix        2 g 200 mL/hr over 30 Minutes Intravenous On call to O.R. 03/03/23 0931 03/03/23 1125        Objective: Vitals:   03/04/23 1542 03/04/23 2048 03/05/23 0438 03/05/23 0844  BP: (!) 131/54 (!) 125/91 (!) 144/61 135/74  Pulse: 81 77 70 71  Resp: 16 17 16 16   Temp: 98 F (36.7 C) 98.9 F (37.2 C) 98.2 F (36.8 C) 97.6 F (36.4 C)  TempSrc: Oral Oral Oral   SpO2: 94% 99% 95% 98%  Weight:      Height:  No intake or output data in the 24 hours ending 03/05/23 1146  Filed Weights   03/03/23 0552 03/03/23 1007  Weight: 53.5 kg 53.5 kg    Examination:  General exam: Appears calm and comfortable  Respiratory system: Clear to auscultation. Respiratory effort normal. No respiratory distress. No conversational dyspnea.  Cardiovascular system: S1 & S2 heard, RRR. No murmurs. No pedal edema. Gastrointestinal system: Abdomen is nondistended, soft and nontender. Normal bowel sounds heard. Central nervous system: Alert and oriented. No focal neurological deficits. Speech clear.  Extremities: Symmetric in appearance   Skin: No rashes, lesions or ulcers on exposed skin  Psychiatry: Judgement and insight appear normal. Mood & affect appropriate.   Data Reviewed: I have personally reviewed following labs and imaging studies  CBC: Recent Labs  Lab 03/03/23 0636 03/04/23 0031  WBC 6.8 6.3  NEUTROABS 5.5  --   HGB 11.7* 10.2*  HCT 37.0 32.0*  MCV 90.2 89.6  PLT 159 173    Basic Metabolic Panel: Recent Labs  Lab 03/03/23 0636 03/04/23 0031  NA 142 136  K 3.8 3.9  CL 110 101  CO2 20* 27  GLUCOSE 140* 181*  BUN 22 18  CREATININE 0.81 1.01*  CALCIUM 8.7* 8.6*    GFR: Estimated Creatinine Clearance: 31 mL/min (A) (by C-G formula based on SCr of 1.01 mg/dL (H)). Liver Function Tests: Recent Labs  Lab 03/03/23 1502  AST 27  ALT 20  ALKPHOS 103  BILITOT 0.8  PROT 6.3*  ALBUMIN 3.6    No results for input(s): "LIPASE", "AMYLASE" in the last 168 hours. No results for input(s): "AMMONIA" in the last 168 hours. Coagulation Profile: Recent Labs  Lab 03/03/23 1502  INR 1.1    Cardiac Enzymes: No results for input(s): "CKTOTAL", "CKMB", "CKMBINDEX", "TROPONINI" in the last 168 hours. BNP (last 3 results) No results for input(s): "PROBNP" in the last 8760 hours. HbA1C: No results for input(s): "HGBA1C" in the last 72 hours. CBG: Recent Labs  Lab 03/04/23 0802 03/04/23 1213 03/04/23 1657 03/04/23 2115 03/05/23 0846  GLUCAP 142* 168* 223* 187* 159*    Lipid Profile: No results for input(s): "CHOL", "HDL", "LDLCALC", "TRIG", "CHOLHDL", "LDLDIRECT" in the last 72 hours. Thyroid Function Tests: No results for input(s): "TSH", "T4TOTAL", "FREET4", "T3FREE", "THYROIDAB" in the last 72 hours. Anemia Panel: No results for input(s): "VITAMINB12", "FOLATE", "FERRITIN", "TIBC", "IRON", "RETICCTPCT" in the last 72 hours. Sepsis Labs: No results for input(s): "PROCALCITON", "LATICACIDVEN" in the last 168 hours.  Recent Results (from the past 240 hour(s))  Surgical pcr screen      Status: None   Collection Time: 03/03/23  9:19 AM   Specimen: Nasal Mucosa; Nasal Swab  Result Value Ref Range Status   MRSA, PCR NEGATIVE NEGATIVE Final   Staphylococcus aureus NEGATIVE NEGATIVE Final    Comment: (NOTE) The Xpert SA Assay (FDA approved for NASAL specimens in patients 42 years of age and older), is one component of a comprehensive surveillance program. It is not intended to diagnose infection nor to guide or monitor treatment. Performed at [facility], [address], [place], [place] [address]       Radiology Studies: DG HIP UNILAT W OR W/O PELVIS 2-3 VIEWS LEFT  Result Date: 03/04/2023 CLINICAL DATA:  Postop hip replacement. EXAM: DG HIP (WITH OR WITHOUT PELVIS) 2-3V LEFT COMPARISON:  03/03/2023 FINDINGS: Status post left hip replacement. No evidence for immediate hardware complication. Gas in the soft tissues is compatible with the immediate postoperative state.  Previous right hip replacement and sacral stimulator device evident. IMPRESSION: Status post left hip replacement without immediate hardware complication. Electronically Signed   By: Kennith Center M.D.   On: 03/04/2023 07:42      Scheduled Meds:  aspirin EC  81 mg Oral BID   cycloSPORINE  1 drop Both Eyes BID   donepezil  10 mg Oral QHS   feeding supplement  237 mL Oral BID BM   insulin aspart  0-9 Units Subcutaneous TID WC   multivitamin with minerals  1 tablet Oral Daily   rosuvastatin  10 mg Oral QHS   sertraline  50 mg Oral Daily   Continuous Infusions:   LOS: 2 days   Time spent: 25 minutes   Noralee Stain, DO [facility] 03/05/2023, 11:46 AM   Available via Epic secure chat 7am-7pm After these hours, please refer to coverage provider listed on [email or web address]

## 2023-03-05 NOTE — Progress Notes (Signed)
Physical Therapy Treatment Patient Details Name: Savannah Harris MRN: [id number] DOB: Aug 16, 1936 Today's Date: 03/05/2023   History of Present Illness 87 y.o. female with medical history significant of T2DM, HTN, HLD,  OSA, OAB, early Alzheimer's dementia who presented to ED after a fall at home. She got up to use the restroom at [facility] and as she was standing up and adjusting her adult diaper brief, she fell and hit her head and left side with immediate pain in her left hip. History of frequent falls with hx of fracture to other hip with ORIF.  Workup in the emergency department revealed minimally displaced left femoral neck fracture.  She underwent left hip hemiarthroplasty by Dr. Blanchie Dessert 5/19. History of T11/L1 compression fracture after fall in tub s/p kyphoplasty x 2 by Dr. Maurice Small on 02/08/2023. Husband reports that pt has been experiencing increased back pain since her surgery in April.    PT Comments    Continuing work on functional mobility and activity tolerance;  session focused on sit<>stand transfers and standing tolerance, posture; Better initiation of sit to stand with anterior weight shift by using the stedy; the bar gave pt a more anterior target, and created a visual block to help with pt's fear of falling; Within stedy, pt was able to stand taller, more fully extend hips, especially when leaning forward to kiss her tall husband   Recommendations for follow up therapy are one component of a multi-disciplinary discharge planning process, led by the attending physician.  Recommendations may be updated based on patient status, additional functional criteria and insurance authorization.  Follow Up Recommendations  Can patient physically be transported by private vehicle: No    Assistance Recommended at Discharge Frequent or constant Supervision/Assistance  Patient can return home with the following A lot of help with walking and/or transfers;Two people to help with walking and/or  transfers;Assistance with cooking/housework;Assist for transportation;Help with stairs or ramp for entrance   Equipment Recommendations  Rolling walker (2 wheels);BSC/3in1;Wheelchair (measurements PT);Wheelchair cushion (measurements PT)    Recommendations for Other Services       Precautions / Restrictions Precautions Precautions: Back;Fall Required Braces or Orthoses: Spinal Brace Spinal Brace: Thoracolumbosacral orthotic Restrictions LLE Weight Bearing: Weight bearing as tolerated     Mobility  Bed Mobility Overal bed mobility: Needs Assistance Bed Mobility: Sidelying to Sit, Rolling Rolling: Mod assist Sidelying to sit: Mod assist, +2 for physical assistance, +2 for safety/equipment       General bed mobility comments: VC for log roll technique and to use bed rail on right side. VC for sequencing and steps with increased time due to pain provided.    Transfers Overall transfer level: Needs assistance Equipment used: Rolling walker (2 wheels) Transfers: Sit to/from Stand, Bed to chair/wheelchair/BSC Sit to Stand: Mod assist, +2 physical assistance, From elevated surface           General transfer comment: Multimodal cueing to initaite with anterior weightt shift; Better able to lean forward with use of the stedy bar to pull up on, and it provides a visual block that can help with fear of falling Transfer via Lift Equipment: Stedy  Ambulation/Gait               General Gait Details: deferred today to focus on standing balance, tolerance, and posture   Stairs             Wheelchair Mobility    Modified Rankin (Stroke Patients Only)       Balance  Sitting balance-Leahy Scale: Poor Sitting balance - Comments: sitting EOB. Required VC to place more weight into left hip to achieve midline and upright sitting posture. Postural control: Right lateral lean   Standing balance-Leahy Scale: Poor Standing balance comment: Stood within stedy a few  reps with multimodal cues for fully extending hips; better upright standing when pt's husband (on the tall side) stood infront of her, and they kissed x3                            Cognition Arousal/Alertness: Awake/alert Behavior During Therapy: WFL for tasks assessed/performed Overall Cognitive Status: History of cognitive impairments - at baseline                                 General Comments: history of earler Alzheimer's.        Exercises      General Comments General comments (skin integrity, edema, etc.): O2 sats on room air ranged 87-94%; restarted supplemental O2 end of session      Pertinent Vitals/Pain Pain Assessment Pain Assessment: 0-10 Pain Score: 8  Pain Location: back and left hip/leg Pain Descriptors / Indicators: Grimacing, Guarding, Discomfort, Moaning Pain Intervention(s): Monitored during session    Home Living                          Prior Function            PT Goals (current goals can now be found in the care plan section) Acute Rehab PT Goals Patient Stated Goal: Wants to get better and be able to walk PT Goal Formulation: With patient/family Time For Goal Achievement: 03/18/23 Potential to Achieve Goals: Fair Progress towards PT goals: Progressing toward goals    Frequency    Min 4X/week      PT Plan Current plan remains appropriate    Co-evaluation              AM-PAC PT "6 Clicks" Mobility   Outcome Measure  Help needed turning from your back to your side while in a flat bed without using bedrails?: A Lot Help needed moving from lying on your back to sitting on the side of a flat bed without using bedrails?: Total Help needed moving to and from a bed to a chair (including a wheelchair)?: Total Help needed standing up from a chair using your arms (e.g., wheelchair or bedside chair)?: Total Help needed to walk in hospital room?: Total Help needed climbing 3-5 steps with a railing? :  Total 6 Click Score: 7    End of Session Equipment Utilized During Treatment: Gait belt;Back brace Activity Tolerance: Patient limited by pain Patient left: in chair;with call bell/phone within reach;with chair alarm set;with family/visitor present Nurse Communication: Mobility status PT Visit Diagnosis: Unsteadiness on feet (R26.81);Other abnormalities of gait and mobility (R26.89);History of falling (Z91.81);Muscle weakness (generalized) (M62.81);Pain Pain - Right/Left: Left Pain - part of body: Hip     Time: 1340-1410 PT Time Calculation (min) (ACUTE ONLY): 30 min  Charges:  $Therapeutic Activity: 23-37 mins                     Van Clines, PT  Acute Rehabilitation Services Office [phone] Secure Chat welcomed    Levi Aland 03/05/2023, 6:18 PM

## 2023-03-05 NOTE — NC FL2 (Signed)
MEDICAID FL2 LEVEL OF CARE FORM     IDENTIFICATION  Patient Name: Savannah Harris Birthdate: 1935/10/27 Sex: female Admission Date (Current Location): 03/03/2023  [facility] and [place] Number:  Producer, television/film/video and Address:  [facility], [address], [place], [place] [address]      Provider Number: [id number]  Attending Physician Name and Address:  Noralee Stain, DO  Relative Name and Phone Number:  Lizabella Kutney  [phone]    Current Level of Care: Hospital Recommended Level of Care: Skilled Nursing Facility Prior Approval Number:    Date Approved/Denied:   PASRR Number: [id number]  Discharge Plan: SNF    Current Diagnoses: Patient Active Problem List   Diagnosis Date Noted   Alzheimer's dementia without behavioral disturbance (HCC) 03/04/2023   Compression fracture of body of thoracic vertebra/lumbar 03/03/2023   Closed displaced fracture of left femoral neck (HCC) 03/03/2023   Sleep apnea 02/22/2017   Memory loss 10/23/2014   Essential hypertension 10/23/2014   Hyperlipidemia 10/23/2014   Type 2 diabetes mellitus with complication (HCC) 10/23/2014   Diastasis recti 03/02/2013   Constipation, chronic 03/02/2013    Orientation RESPIRATION BLADDER Height & Weight     Self, Time, Situation, Place  O2 ([place] 1L) Continent Weight: 117 lb 15.1 oz (53.5 kg) Height:  4' 11.02" (149.9 cm)  BEHAVIORAL SYMPTOMS/MOOD NEUROLOGICAL BOWEL NUTRITION STATUS      Continent Diet  AMBULATORY STATUS COMMUNICATION OF NEEDS Skin   Extensive Assist Verbally Surgical wounds (L hip inc)                       Personal Care Assistance Level of Assistance  Bathing, Feeding, Dressing Bathing Assistance: Maximum assistance Feeding assistance: Independent Dressing Assistance: Maximum assistance     Functional Limitations Info  Sight, Hearing, Speech Sight Info: Adequate Hearing Info: Adequate Speech Info: Adequate    SPECIAL CARE  FACTORS FREQUENCY  PT (By licensed PT), OT (By licensed OT)     PT Frequency: 5x week OT Frequency: 5x week            Contractures Contractures Info: Not present    Additional Factors Info  Code Status, Allergies, Psychotropic, Insulin Sliding Scale Code Status Info: Full Allergies Info: Latex  Sulfamethoxazole-trimethoprim  Adhesive (Tape)  Canagliflozin  Codeine Psychotropic Info: Donepezil, sertraline Insulin Sliding Scale Info: See DC summary       Current Medications (03/05/2023):  This is the current hospital active medication list Current Facility-Administered Medications  Medication Dose Route Frequency Provider Last Rate Last Admin   aspirin EC tablet 81 mg  81 mg Oral BID Joen Laura, MD   81 mg at 03/05/23 8413   cycloSPORINE (RESTASIS) 0.05 % ophthalmic emulsion 1 drop  1 drop Both Eyes BID Orland Mustard, MD   1 drop at 03/05/23 0858   donepezil (ARICEPT) tablet 10 mg  10 mg Oral Myrtie Soman, MD   10 mg at 03/04/23 2134   feeding supplement (ENSURE ENLIVE / ENSURE PLUS) liquid 237 mL  237 mL Oral BID BM Noralee Stain, DO   237 mL at 03/05/23 0908   insulin aspart (novoLOG) injection 0-9 Units  0-9 Units Subcutaneous TID WC Joen Laura, MD   3 Units at 03/05/23 1240   metoprolol tartrate (LOPRESSOR) injection 5 mg  5 mg Intravenous Q8H PRN Orland Mustard, MD       morphine (PF) 2 MG/ML injection 0.5 mg  0.5  mg Intravenous Q2H PRN Joen Laura, MD   0.5 mg at 03/04/23 1133   multivitamin with minerals tablet 1 tablet  1 tablet Oral Daily Noralee Stain, DO   1 tablet at 03/05/23 0858   ondansetron (ZOFRAN) injection 4 mg  4 mg Intravenous Q6H PRN Joen Laura, MD       oxyCODONE (Oxy IR/ROXICODONE) immediate release tablet 2.5-5 mg  2.5-5 mg Oral Q4H PRN Joen Laura, MD   5 mg at 03/05/23 1610   rosuvastatin (CRESTOR) tablet 10 mg  10 mg Oral QHS Orland Mustard, MD   10 mg at 03/04/23 2134   sertraline (ZOLOFT) tablet  50 mg  50 mg Oral Daily Orland Mustard, MD   50 mg at 03/05/23 9604     Discharge Medications: Please see discharge summary for a list of discharge medications.  Relevant Imaging Results:  Relevant Lab Results:   Additional Information SS# [id number] [place]

## 2023-03-06 DIAGNOSIS — S72002A Fracture of unspecified part of neck of left femur, initial encounter for closed fracture: Secondary | ICD-10-CM | POA: Diagnosis not present

## 2023-03-06 LAB — GLUCOSE, CAPILLARY
Glucose-Capillary: 182 mg/dL — ABNORMAL HIGH (ref 70–99)
Glucose-Capillary: 216 mg/dL — ABNORMAL HIGH (ref 70–99)
Glucose-Capillary: 135 mg/dL — ABNORMAL HIGH (ref 70–99)
Glucose-Capillary: 234 mg/dL — ABNORMAL HIGH (ref 70–99)

## 2023-03-06 MED ORDER — OXYCODONE HCL 5 MG PO TABS: 2.5000 mg | ORAL_TABLET | Freq: Four times a day (QID) | ORAL | 0 refills | Status: AC | PRN

## 2023-03-06 MED ORDER — ASPIRIN 81 MG PO TBEC: DELAYED_RELEASE_TABLET | ORAL | Status: AC

## 2023-03-06 MED ORDER — ENSURE ENLIVE PO LIQD: 237.0000 mL | Freq: Two times a day (BID) | ORAL | Status: AC

## 2023-03-06 MED ORDER — POLYETHYLENE GLYCOL 3350 17 G PO PACK: 17.0000 g | PACK | Freq: Every day | ORAL | Status: AC

## 2023-03-06 MED ORDER — POLYETHYLENE GLYCOL 3350 17 G PO PACK
17.0000 g | PACK | Freq: Every day | ORAL | Status: DC
  Administered 2023-03-06: 17 g via ORAL
  Filled 2023-03-06: qty 1

## 2023-03-06 NOTE — TOC Transition Note (Signed)
Transition of Care [facility]) - CM/SW Discharge Note   Patient Details  Name: Savannah Harris MRN: [id number] Date of Birth: 05-05-1936  [facility] of [facility]) CM/SW Contact:  Carley Hammed, LCSW Phone Number: 03/06/2023, 3:12 PM   Clinical Narrative:    Pt to be transported to [facility] via [place]. Nurse to call report to [phone].   Final next level of care: Skilled Nursing Facility Barriers to Discharge: Barriers Resolved   Patient Goals and CMS Choice CMS Medicare.gov Compare Post Acute Care list provided to:: Patient Choice offered to / list presented to : Patient  Discharge Placement                Patient chooses bed at: [facility] Patient to be transferred to facility by: [facility] Name of family member notified: Chrissie Noa Patient and family notified of of transfer: 03/06/23  Discharge Plan and Services Additional resources added to the After Visit Summary for       Post Acute Care Choice: Skilled Nursing Facility                               Social Determinants of Health (SDOH) Interventions SDOH Screenings   Food Insecurity: No Food Insecurity (03/03/2023)  Housing: Patient Declined (03/03/2023)  Transportation Needs: No Transportation Needs (03/03/2023)  Utilities: Not At Risk (03/03/2023)  Tobacco Use: Low Risk  (03/04/2023)     Readmission Risk Interventions     No data to display

## 2023-03-06 NOTE — Progress Notes (Signed)
Report called to  [facility]. Spoke with Nurse Leonard Schwartz who will be taking care of the patient when she arrives. Nurse verbalized understanding of report and had no additional questions. Patient currently in room waiting for [facility] to pick her up

## 2023-03-06 NOTE — Hospital Course (Signed)
Savannah Harris is a 87 y.o. female with a history of diabetes mellitus type 2, hypertension, hyperlipidemia, obstructive sleep apnea, overactive bladder.  Patient presented secondary to a fall at home and suffered a left femoral neck fracture.  Orthopedic surgery was consulted and performed a left hemiarthroplasty.  Orthopedic surgery recommendations for weightbearing as tolerated.  PT/OT recommending SNF on discharge.  Patient also found to have subacute vertebral fractures.  Neurosurgery was consulted while the patient was in the emergency department and they recommended continued pain management and outpatient follow-up.

## 2023-03-06 NOTE — Assessment & Plan Note (Signed)
Continue Aricept 

## 2023-03-06 NOTE — Assessment & Plan Note (Deleted)
Continue Aricept 

## 2023-03-06 NOTE — Progress Notes (Signed)
Occupational Therapy Treatment Patient Details Name: Savannah Harris MRN: [id number] DOB: 1935/11/22 Today's Date: 03/06/2023   History of present illness 87 y.o. female with medical history significant of T2DM, HTN, HLD,  OSA, OAB, early Alzheimer's dementia who presented to ED after a fall at home. She got up to use the restroom at [facility] and as she was standing up and adjusting her adult diaper brief, she fell and hit her head and left side with immediate pain in her left hip. History of frequent falls with hx of fracture to other hip with ORIF.  Workup in the emergency department revealed minimally displaced left femoral neck fracture.  She underwent left hip hemiarthroplasty by Dr. Blanchie Dessert 5/19. History of T11/L1 compression fracture after fall in tub s/p kyphoplasty x 2 by Dr. Maurice Small on 02/08/2023. Husband reports that pt has been experiencing increased back pain since her surgery in April.   OT comments  Patient is making progress toward OT goals.  Patient completed supine to sitting EOB using log roll technique with max A.  Patient sat EOB and completed UB grooming task of washing face and oral hygiene with min guard and verbal cues to sit upright versus leaning posteriorly.  Patient completed sit to stand with mod A and mod A with RW for bed to bedside chair transfer with verbal cues for sequencing and RW mgmt. Patient requires total A for donning socks and total A for donning TLSO brace when initially sate EOB. Patient demo's good tolerance during treatment and patient highly motivated to participate in OT treatment.   Recommendations for follow up therapy are one component of a multi-disciplinary discharge planning process, led by the attending physician.  Recommendations may be updated based on patient status, additional functional criteria and insurance authorization.    Assistance Recommended at Discharge Frequent or constant Supervision/Assistance  Patient can return home with the  following  A lot of help with walking and/or transfers;A lot of help with bathing/dressing/bathroom;Assistance with cooking/housework;Assist for transportation;Help with stairs or ramp for entrance   Equipment Recommendations  Other (comment)    Recommendations for Other Services      Precautions / Restrictions Precautions Precautions: Back;Fall Required Braces or Orthoses: Spinal Brace Spinal Brace: Thoracolumbosacral orthotic Restrictions Weight Bearing Restrictions: Yes       Mobility Bed Mobility Overal bed mobility: Needs Assistance Bed Mobility: Sidelying to Sit, Rolling Rolling: Mod assist Sidelying to sit: Max assist       General bed mobility comments: VC for log roll technique and to use bed rail on right side. VC for sequencing and steps with increased time due to pain provided.    Transfers Overall transfer level: Needs assistance Equipment used: Rolling walker (2 wheels) Transfers: Sit to/from Stand, Bed to chair/wheelchair/BSC Sit to Stand: Mod assist     Step pivot transfers: Mod assist     General transfer comment: verbal cues for pushing up from seated surface with at least 1 hand.  Verbal cues to stand upright and to slow breathing when initial sit to stand completed 2/2 fear of falling.  Patient required step by step sequencing directions for RW mgmt and self to complete bed to bedside chair transfer     Balance Overall balance assessment: History of Falls, Needs assistance Sitting-balance support: Bilateral upper extremity supported, Feet supported Sitting balance-Leahy Scale: Fair Sitting balance - Comments: patient requires verbal cues to sit upright versus leaning back while seated EOB  ADL either performed or assessed with clinical judgement   ADL Overall ADL's : Needs assistance/impaired     Grooming: Wash/dry face;Oral care;Set up;Min guard Grooming Details (indicate cue type and reason):  verbal cues to sit upright versus leaning back             Lower Body Dressing: Total assistance;Bed level Lower Body Dressing Details (indicate cue type and reason): donning socks             Functional mobility during ADLs: Moderate assistance General ADL Comments: patient completed bed to bedside chair transfer using RW with mod A and verbal; cues for sequencing and RW mgmt    Extremity/Trunk Assessment Upper Extremity Assessment Upper Extremity Assessment: Overall WFL for tasks assessed            Vision   Vision Assessment?: No apparent visual deficits   Perception     Praxis      Cognition Arousal/Alertness: Awake/alert Behavior During Therapy: WFL for tasks assessed/performed Overall Cognitive Status: History of cognitive impairments - at baseline                                 General Comments: history of earler Alzheimer's.        Exercises      Shoulder Instructions       General Comments      Pertinent Vitals/ Pain       Pain Assessment Pain Assessment: 0-10 Pain Score: 7  Pain Location:  (back and across chest) Pain Descriptors / Indicators: Discomfort  Home Living                                          Prior Functioning/Environment              Frequency  Min 2X/week        Progress Toward Goals  OT Goals(current goals can now be found in the care plan section)  Progress towards OT goals: Progressing toward goals  Acute Rehab OT Goals Patient Stated Goal: I want to get better and get home Time For Goal Achievement: 03/18/23 Potential to Achieve Goals: Good ADL Goals Pt Will Perform Grooming: with supervision;standing Pt Will Perform Upper Body Bathing: with set-up;sitting Pt Will Perform Lower Body Bathing: with min assist;sit to/from stand;sitting/lateral leans Pt Will Transfer to Toilet: with min guard assist;ambulating;bedside commode Pt Will Perform Toileting - Clothing  Manipulation and hygiene: with min guard assist;sitting/lateral leans;sit to/from stand Pt/caregiver will Perform Home Exercise Program: Increased strength;Both right and left upper extremity;With theraband;With Supervision;With written HEP provided Additional ADL Goal #1: Pt will increase bed mobility while transitioning to seated on EOB with SBA and adhering to back precautions.  Plan Frequency remains appropriate    Co-evaluation          OT goals addressed during session: ADL's and self-care      AM-PAC OT "6 Clicks" Daily Activity     Outcome Measure   Help from another person eating meals?: None Help from another person taking care of personal grooming?: A Little Help from another person toileting, which includes using toliet, bedpan, or urinal?: Total Help from another person bathing (including washing, rinsing, drying)?: Total Help from another person to put on and taking off regular upper body clothing?: A Little Help from another person to put  on and taking off regular lower body clothing?: Total 6 Click Score: 13    End of Session Equipment Utilized During Treatment: Gait belt;Rolling walker (2 wheels)  OT Visit Diagnosis: Unsteadiness on feet (R26.81);Pain;History of falling (Z91.81);Muscle weakness (generalized) (M62.81)   Activity Tolerance Patient tolerated treatment well   Patient Left in chair;with call bell/phone within reach;with family/visitor present;Other (comment)   Nurse Communication          Time: 1308-6578 OT Time Calculation (min): 69 min  Charges: OT Treatments $Therapeutic Activity: 68-82 mins  Governor Specking OT 03/06/23   Denice Paradise 03/06/2023, 1:33 PM

## 2023-03-06 NOTE — Discharge Summary (Signed)
Physician Discharge Summary   Patient: Savannah Harris MRN: [id number] DOB: 1936/01/21  Admit date:     03/03/2023  Discharge date: 03/06/23  Discharge Physician: Jacquelin Hawking, MD   PCP: Cleatis Polka., MD   Recommendations at discharge:  PCP follow-up Orthopedic surgery follow-up in 2 weeks Neurosurgery follow-up for vertebral fractures  Discharge Diagnoses: Principal Problem:   Closed displaced fracture of left femoral neck (HCC) Active Problems:   Compression fracture of body of thoracic vertebra/lumbar   Type 2 diabetes mellitus with complication (HCC)   Hyperlipidemia   Sleep apnea   Alzheimer's dementia without behavioral disturbance (HCC)  Resolved Problems:   * No resolved hospital problems. *  Hospital Course: Savannah Harris is a 87 y.o. female with a history of diabetes mellitus type 2, hypertension, hyperlipidemia, obstructive sleep apnea, overactive bladder.  Patient presented secondary to a fall at home and suffered a left femoral neck fracture.  Orthopedic surgery was consulted and performed a left hemiarthroplasty.  Orthopedic surgery recommendations for weightbearing as tolerated.  PT/OT recommending SNF on discharge.  Patient also found to have subacute vertebral fractures.  Neurosurgery was consulted while the patient was in the emergency department and they recommended continued pain management and outpatient follow-up.  Assessment and Plan: * Closed displaced fracture of left femoral neck (HCC) Secondary to fall with resultant minimally displaced left femoral neck fracture. Orthopedic surgery consulted and performed 87 year old presenting after mechanical fall at home found to have minimally displaced left femoral neck fracture. Left hemiarthroplasty on 5/19. Orthopedic surgery recommendations for weight bearing as tolerated, VTE prophylaxis including Aspirin 81 mg BID x4 weeks, keep dressing intact until outpatient follow-up and outpatient follow-up in 2  weeks.  Compression fracture of body of thoracic vertebra/lumbar Patient with a history of T11/L1 compression fracture s/p kyphoplasty on 02/08/2023. X-ray significant for subacute superior endplate fractures at L2, L3, L4 and T11 in addition to a subacute fracture at the inferior endplate of T10. Neurosurgery consulted in the ED and recommended pain management. Recommend patient follows with Dr. Maurice Small as an outpatient.  Type 2 diabetes mellitus with complication (HCC) Controlled. With recent hemoglobin A1C of 6.5%. Patient is on metformin and glimepiride as an outpatient. Continue metformin and discontinue glimepiride secondary to age.  Hyperlipidemia Continue Crestor 10 mg daily.  Sleep apnea Noted. Per history, can not tolerate CPAP.  Alzheimer's dementia without behavioral disturbance (HCC) Continue Aricept.   Consultants: Orthopedic surgery Procedures performed:  5/19: ARTHROPLASTY BIPOLAR HIP (HEMIARTHROPLASTY)   Disposition: Skilled nursing facility Diet recommendation: Carb modified diet  DISCHARGE MEDICATION: Allergies as of 03/06/2023       Reactions   Latex Other (See Comments)   BLISTERS   Sulfamethoxazole-trimethoprim Nausea And Vomiting   Adhesive [tape] Other (See Comments)   BLISTERS   Canagliflozin Other (See Comments)   Pt states Invokana causes dizziness   Codeine Nausea And Vomiting        Medication List     STOP taking these medications    acetaminophen 500 MG tablet Commonly known as: TYLENOL   glimepiride 1 MG tablet Commonly known as: AMARYL   HYDROcodone-acetaminophen 5-325 MG tablet Commonly known as: Norco       TAKE these medications    Align 4 MG Caps Take 4 mg by mouth daily.   aspirin EC 81 MG tablet Take 1 tablet (81 mg total) by mouth 2 (two) times daily for 28 days, THEN 1 tablet (81 mg total) daily. Swallow whole.Marland Kitchen  Start taking on: Mar 06, 2023 What changed: See the new instructions.   cyanocobalamin 1000 MCG  tablet Commonly known as: VITAMIN B12 Take 1,000 mcg by mouth daily.   cycloSPORINE 0.05 % ophthalmic emulsion Commonly known as: RESTASIS Place 1 drop into both eyes 2 (two) times daily.   donepezil 10 MG tablet Commonly known as: ARICEPT Take 10 mg by mouth at bedtime.   feeding supplement Liqd Take 237 mLs by mouth 2 (two) times daily between meals. Start taking on: Mar 07, 2023   Fish Oil 1000 MG Caps Take 1,000 mg by mouth daily.   fluticasone 50 MCG/ACT nasal spray Commonly known as: FLONASE Place 1 spray into both nostrils daily as needed for allergies or rhinitis.   metFORMIN 1000 MG tablet Commonly known as: GLUCOPHAGE Take 1,000 mg by mouth 2 (two) times daily.   oxyCODONE 5 MG immediate release tablet Commonly known as: Oxy IR/ROXICODONE Take 0.5-1 tablets (2.5-5 mg total) by mouth every 6 (six) hours as needed for severe pain or moderate pain.   polyethylene glycol 17 g packet Commonly known as: MIRALAX / GLYCOLAX Take 17 g by mouth daily.   rosuvastatin 10 MG tablet Commonly known as: CRESTOR Take 10 mg by mouth daily.   sertraline 50 MG tablet Commonly known as: ZOLOFT Take 50 mg by mouth daily.   Vitamin D3 125 MCG (5000 UT) Caps Take 5,000 Units by mouth daily.        Contact information for follow-up providers     Joen Laura, MD Follow up in 2 week(s).   Specialty: Orthopedic Surgery Contact information: [address] [place] [address] [phone]         Jadene Pierini, MD. Schedule an appointment as soon as possible for a visit in 2 week(s).   Specialty: Neurosurgery Why: Vertebral fracture Contact information: [address] [place] [address] [phone]              Contact information for after-discharge care     Destination     [facility] .   Service: Skilled Nursing Contact information: [address] [place]  [address] [phone]                    Discharge Exam: BP 133/60 (BP Location: Left Arm)   Pulse 68   Temp 98.3 F (36.8 C) (Oral)   Resp 15   Ht 4' 11.02" (1.499 m)   Wt 53.5 kg   SpO2 97%   BMI 23.81 kg/m   General exam: Appears calm and comfortable Respiratory system: Clear to auscultation. Respiratory effort normal. Cardiovascular system: S1 & S2 heard, RRR. Gastrointestinal system: Abdomen is soft and nontender. Normal bowel sounds heard. Central nervous system: Alert.   Condition at discharge: stable  The results of significant diagnostics from this hospitalization (including imaging, microbiology, ancillary and laboratory) are listed below for reference.   Imaging Studies: DG HIP UNILAT W OR W/O PELVIS 2-3 VIEWS LEFT  Result Date: 03/04/2023 CLINICAL DATA:  Postop hip replacement. EXAM: DG HIP (WITH OR WITHOUT PELVIS) 2-3V LEFT COMPARISON:  03/03/2023 FINDINGS: Status post left hip replacement. No evidence for immediate hardware complication. Gas in the soft tissues is compatible with the immediate postoperative state. Previous right hip replacement and sacral stimulator device evident. IMPRESSION: Status post left hip replacement without immediate hardware complication. Electronically Signed   By: Jamison Oka.D.  On: 03/04/2023 07:42   CT LUMBAR SPINE WO CONTRAST  Result Date: 03/03/2023 CLINICAL DATA:  Age related osteoporosis with current pathologic fracture. EXAM: CT LUMBAR SPINE WITHOUT CONTRAST TECHNIQUE: Multidetector CT imaging of the lumbar spine was performed without intravenous contrast administration. Multiplanar CT image reconstructions were also generated. RADIATION DOSE REDUCTION: This exam was performed according to the departmental dose-optimization program which includes automated exposure control, adjustment of the mA and/or kV according to patient size and/or use of iterative reconstruction technique. COMPARISON:  CT of the lumbar spine  01/23/2023 FINDINGS: Segmentation: 5 non rib-bearing lumbar type vertebral bodies are present. The lowest fully formed vertebral body is L5. Alignment: Slight retrolisthesis at L1-2, L2-3 and L3-4 is stable. Vertebrae: Interval spinal augmentation noted at L1. Methylmethacrylate extends into the L1-2 disc space. Acute/subacute superior endplate fractures at L2, L3 and L4 are new since April. Inferior endplate fracture at L5 is also new. 10% loss of height is present at L2, L4 and L5. 30-40% loss of height is present at L3. No retropulsed bone is present. Paraspinal and other soft tissues: Soft tissue swelling present about the fractures. Paraspinous soft tissues are otherwise unremarkable. Atherosclerotic calcifications are again noted in the aorta and branch vessels. Disc levels: Multilevel spondylosis of the lumbar spine is not significantly changed. IMPRESSION: 1. Interval spinal augmentation at L1. 2. Acute/subacute superior endplate fractures at L2, L3 and L4 are new since April. 3. Acute/subacute inferior endplate fracture at L5 is new. 4. 10% loss of height at L2, L4 and L5. 5. 30-40% loss of height at L3. 6. No retropulsed bone. 7. Multilevel spondylosis of the lumbar spine is not significantly changed. 8.  Aortic Atherosclerosis (ICD10-I70.0). Electronically Signed   By: Marin Roberts M.D.   On: 03/03/2023 08:53   CT THORACIC SPINE WO CONTRAST  Result Date: 03/03/2023 CLINICAL DATA:  Age related osteoporosis with current pathological fracture. EXAM: CT THORACIC SPINE WITHOUT CONTRAST TECHNIQUE: Multidetector CT images of the thoracic were obtained using the standard protocol without intravenous contrast. RADIATION DOSE REDUCTION: This exam was performed according to the departmental dose-optimization program which includes automated exposure control, adjustment of the mA and/or kV according to patient size and/or use of iterative reconstruction technique. COMPARISON:  CT of the thoracic spine  without contrast 01/23/2023. FINDINGS: Alignment: No significant listhesis is present. Vertebrae: Interval spinal augmentation present at T11. Methylmethacrylate extends into the T11 disc space. A new inferior endplate fracture is present at T10 with 25% loss of height. A new superior endplate fracture is present at T12 with 25% loss of height anteriorly. No retropulsed bone is present at either level. A remote T4 superior endplate fracture is stable. Vertebral body heights are otherwise maintained. A benign sclerotic focus is present in the posterior aspect of C7. No other focal osseous lesions are present. Paraspinal and other soft tissues: Prevertebral soft tissue swelling/hemorrhage is present at T10 and T12. The paravertebral soft tissues are otherwise within normal limits. Atherosclerotic calcifications are present the aortic arch and descending thoracic aorta. Artery calcifications are present. Mild dependent atelectasis is present in both lungs. The lungs are otherwise clear. Disc levels: A spinal cord stimulator is stable, extending to the T7 level. Previously described multilevel spondylosis of thoracic spine is stable. No significant change is present T10-11 or T11-12. IMPRESSION: 1. Interval spinal augmentation at T11. 2. Methylmethacrylate extends into the T10-11 disc space. 3. New inferior endplate fracture at T10 with 25% loss of height. 4. New superior endplate fracture at T12 with  25% loss of height anteriorly. 5. Prevertebral soft tissue swelling/hemorrhage at T10 and T12. 6. Stable remote T4 superior endplate fracture. 7. Stable spinal cord stimulator. 8. Stable multilevel spondylosis of the thoracic spine. 9.  Aortic Atherosclerosis (ICD10-I70.0). Electronically Signed   By: Marin Roberts M.D.   On: 03/03/2023 08:39   CT HEAD WO CONTRAST ( )  Result Date: 03/03/2023 CLINICAL DATA:  87 year old female with history of trauma from a fall. EXAM: CT HEAD WITHOUT CONTRAST CT CERVICAL SPINE  WITHOUT CONTRAST TECHNIQUE: Multidetector CT imaging of the head and cervical spine was performed following the standard protocol without intravenous contrast. Multiplanar CT image reconstructions of the cervical spine were also generated. RADIATION DOSE REDUCTION: This exam was performed according to the departmental dose-optimization program which includes automated exposure control, adjustment of the mA and/or kV according to patient size and/or use of iterative reconstruction technique. COMPARISON:  CT of the head and cervical spine 06/27/2022. FINDINGS: CT HEAD FINDINGS Brain: Mild cerebral atrophy. Patchy and confluent areas of decreased attenuation are noted throughout the deep and periventricular white matter of the cerebral hemispheres bilaterally, compatible with chronic microvascular ischemic disease. More well-defined area of low attenuation in the right anterior basal ganglia and left cerebellar hemisphere, compatible with old lacunar infarcts. No evidence of acute infarction, hemorrhage, hydrocephalus, extra-axial collection or mass lesion/mass effect. Vascular: No hyperdense vessel or unexpected calcification. Skull: Normal. Negative for fracture or focal lesion. Sinuses/Orbits: No acute finding. Other: None. CT CERVICAL SPINE FINDINGS Alignment: 2 mm of anterolisthesis of C5 upon C6. Alignment is otherwise anatomic. Skull base and vertebrae: No acute fracture. No primary bone lesion or focal pathologic process. Soft tissues and spinal canal: No prevertebral fluid or swelling. No visible canal hematoma. Disc levels: Mild multilevel degenerative disc disease, most evident at C6-C7. Mild multilevel facet arthropathy bilaterally. Well-defined sclerotic focus in the posterior aspect of C7 vertebral body, similar to the prior study from 2023, compatible with a benign bone island. Upper chest: Unremarkable. Other: None. IMPRESSION: 1. No evidence of significant acute traumatic injury to the skull, brain or  cervical spine. 2. Mild cerebral atrophy with extensive chronic microvascular ischemic changes and old lacunar infarcts, similar to the prior study, as above. 3. Multilevel degenerative disc disease and cervical spondylosis, as above. Electronically Signed   By: Trudie Reed M.D.   On: 03/03/2023 08:27   CT CERVICAL SPINE WO CONTRAST  Result Date: 03/03/2023 CLINICAL DATA:  87 year old female with history of trauma from a fall. EXAM: CT HEAD WITHOUT CONTRAST CT CERVICAL SPINE WITHOUT CONTRAST TECHNIQUE: Multidetector CT imaging of the head and cervical spine was performed following the standard protocol without intravenous contrast. Multiplanar CT image reconstructions of the cervical spine were also generated. RADIATION DOSE REDUCTION: This exam was performed according to the departmental dose-optimization program which includes automated exposure control, adjustment of the mA and/or kV according to patient size and/or use of iterative reconstruction technique. COMPARISON:  CT of the head and cervical spine 06/27/2022. FINDINGS: CT HEAD FINDINGS Brain: Mild cerebral atrophy. Patchy and confluent areas of decreased attenuation are noted throughout the deep and periventricular white matter of the cerebral hemispheres bilaterally, compatible with chronic microvascular ischemic disease. More well-defined area of low attenuation in the right anterior basal ganglia and left cerebellar hemisphere, compatible with old lacunar infarcts. No evidence of acute infarction, hemorrhage, hydrocephalus, extra-axial collection or mass lesion/mass effect. Vascular: No hyperdense vessel or unexpected calcification. Skull: Normal. Negative for fracture or focal lesion. Sinuses/Orbits: No acute finding. Other:  None. CT CERVICAL SPINE FINDINGS Alignment: 2 mm of anterolisthesis of C5 upon C6. Alignment is otherwise anatomic. Skull base and vertebrae: No acute fracture. No primary bone lesion or focal pathologic process. Soft  tissues and spinal canal: No prevertebral fluid or swelling. No visible canal hematoma. Disc levels: Mild multilevel degenerative disc disease, most evident at C6-C7. Mild multilevel facet arthropathy bilaterally. Well-defined sclerotic focus in the posterior aspect of C7 vertebral body, similar to the prior study from 2023, compatible with a benign bone island. Upper chest: Unremarkable. Other: None. IMPRESSION: 1. No evidence of significant acute traumatic injury to the skull, brain or cervical spine. 2. Mild cerebral atrophy with extensive chronic microvascular ischemic changes and old lacunar infarcts, similar to the prior study, as above. 3. Multilevel degenerative disc disease and cervical spondylosis, as above. Electronically Signed   By: Trudie Reed M.D.   On: 03/03/2023 08:27   DG Lumbar Spine Complete  Result Date: 03/03/2023 CLINICAL DATA:  Fall.  Recent surgery. EXAM: LUMBAR SPINE - COMPLETE 5 VIEW COMPARISON:  CT of the lumbar spine 03/01/23 FINDINGS: Spinal augmentation is present at T11 and L1. Methylmethacrylate extends into the L1-2 disc space. Subacute superior endplate fractures at L2, L3 and L4 are new since April. Small amount of methylmethacrylate extends into the disc space of T10-11. Subacute fractures at the inferior endplate of T10 and superior endplate of T11 are new since April. Vertebral body heights maintained at L5. Alignment is anatomic. Lumbar lordosis is preserved. Spinal cord stimulator noted. Sacral stimulator noted. Right total hip arthroplasty is present. Left femoral neck fracture is again noted. IMPRESSION: 1. Subacute superior endplate fractures at L2, L3 and L4 are new since April. 2. Subacute fractures at the inferior endplate of T10 and superior endplate of T11 are new since April. 3. Left femoral neck fracture. Electronically Signed   By: Marin Roberts M.D.   On: 03/03/2023 08:19   DG Chest Port 1 View  Result Date: 03/03/2023 CLINICAL DATA:  Left hip  pain status post fall EXAM: PORTABLE CHEST - 1 VIEW COMPARISON:  03/19/2019 FINDINGS: Cardiomediastinal silhouette and pulmonary vasculature are within normal limits. Lungs are clear. Neurostimulator leads terminate in the midthoracic spine. Vertebral augmentation changes seen in the T11 and L1 vertebral bodies. IMPRESSION: No acute cardiopulmonary process. Electronically Signed   By: Acquanetta Belling M.D.   On: 03/03/2023 07:19   DG Pelvis Portable  Result Date: 03/03/2023 CLINICAL DATA:  Fall in bathroom.  Left hip pain. EXAM: PORTABLE PELVIS 1-2 VIEWS; DG HIP (WITH OR WITHOUT PELVIS) 1V PORT LEFT COMPARISON:  CT abdomen pelvis 10/07/2013 and pelvis radiograph 07/03/2017 FINDINGS: Left hip: Minimally displaced femoral neck fracture. Soft tissues are unremarkable. Pelvis: Visualized portions of the right total hip prosthesis are unremarkable. Neurostimulator device noted overlying the right pelvis. Severe degenerative changes of the visualized lower lumbar spine. No additional fractures or dislocations. IMPRESSION: Minimally displaced left femoral neck fracture. Electronically Signed   By: Acquanetta Belling M.D.   On: 03/03/2023 07:17   DG Hip [place] Pelvis 1 View Left  Result Date: 03/03/2023 CLINICAL DATA:  Fall in bathroom.  Left hip pain. EXAM: PORTABLE PELVIS 1-2 VIEWS; DG HIP (WITH OR WITHOUT PELVIS) 1V PORT LEFT COMPARISON:  CT abdomen pelvis 10/07/2013 and pelvis radiograph 07/03/2017 FINDINGS: Left hip: Minimally displaced femoral neck fracture. Soft tissues are unremarkable. Pelvis: Visualized portions of the right total hip prosthesis are unremarkable. Neurostimulator device noted overlying the right pelvis. Severe degenerative changes  of the visualized lower lumbar spine. No additional fractures or dislocations. IMPRESSION: Minimally displaced left femoral neck fracture. Electronically Signed   By: Acquanetta Belling M.D.   On: 03/03/2023 07:17   DG THORACOLUMBAR SPINE  Result Date:  02/08/2023 CLINICAL DATA:  L1 kyphoplasty. EXAM: THORACOLUMBAR SPINE 1V Region exposure index: 54.48 mGy. COMPARISON:  January 21, 2022. FINDINGS: Six intraoperative fluoroscopic images were obtained during kyphoplasty of T11 and L1 vertebral bodies. IMPRESSION: Fluoroscopic guidance provided during kyphoplasty at 2 levels. Electronically Signed   By: Lupita Raider M.D.   On: 02/08/2023 11:39   DG C-Arm 1-60 Min-No Report  Result Date: 02/08/2023 Fluoroscopy was utilized by the requesting physician.  No radiographic interpretation.   DG C-Arm 1-60 Min-No Report  Result Date: 02/08/2023 Fluoroscopy was utilized by the requesting physician.  No radiographic interpretation.    Microbiology: Results for orders placed or performed during the hospital encounter of 03/03/23  Surgical pcr screen     Status: None   Collection Time: 03/03/23  9:19 AM   Specimen: Nasal Mucosa; Nasal Swab  Result Value Ref Range Status   MRSA, PCR NEGATIVE NEGATIVE Final   Staphylococcus aureus NEGATIVE NEGATIVE Final    Comment: (NOTE) The Xpert SA Assay (FDA approved for NASAL specimens in patients 4 years of age and older), is one component of a comprehensive surveillance program. It is not intended to diagnose infection nor to guide or monitor treatment. Performed at [facility], [address], [place], [place] [address]     Labs: CBC: Recent Labs  Lab 03/03/23 0636 03/04/23 0031  WBC 6.8 6.3  NEUTROABS 5.5  --   HGB 11.7* 10.2*  HCT 37.0 32.0*  MCV 90.2 89.6  PLT 159 173   Basic Metabolic Panel: Recent Labs  Lab 03/03/23 0636 03/04/23 0031  NA 142 136  K 3.8 3.9  CL 110 101  CO2 20* 27  GLUCOSE 140* 181*  BUN 22 18  CREATININE 0.81 1.01*  CALCIUM 8.7* 8.6*   Liver Function Tests: Recent Labs  Lab 03/03/23 1502  AST 27  ALT 20  ALKPHOS 103  BILITOT 0.8  PROT 6.3*  ALBUMIN 3.6   CBG: Recent Labs  Lab 03/05/23 1204 03/05/23 1621 03/05/23 2014 03/06/23 0812  03/06/23 1154  GLUCAP 248* 202* 268* 182* 216*    Discharge time spent: 35 minutes.  Signed: Jacquelin Hawking, MD [facility] 03/06/2023

## 2023-03-06 NOTE — Progress Notes (Signed)
PRN oxy given per pt request 

## 2023-03-06 NOTE — TOC Progression Note (Signed)
Transition of Care [facility]) - Progression Note    Patient Details  Name: Savannah Harris MRN: [id number] Date of Birth: 1936/04/28  [facility]) CM/SW Contact  Carley Hammed, LCSW Phone Number: 03/06/2023, 2:11 PM  Clinical Narrative:     CSW provided bed offers to pt and family at bedside. All questions and concerns addressed. Pt and family have chosen [facility]. They can accept pt as soon as insurance authorization is approved. Authorization is pending at this time. TOC will continue to follow for DC needs.   Expected Discharge Plan: Skilled Nursing Facility Barriers to Discharge: Continued Medical Work up, English as a second language teacher, SNF Pending bed offer  Expected Discharge Plan and Services     Post Acute Care Choice: Skilled Nursing Facility Living arrangements for the past 2 months: Single Family Home                                       Social Determinants of Health (SDOH) Interventions SDOH Screenings   Food Insecurity: No Food Insecurity (03/03/2023)  Housing: Patient Declined (03/03/2023)  Transportation Needs: No Transportation Needs (03/03/2023)  Utilities: Not At Risk (03/03/2023)  Tobacco Use: Low Risk  (03/04/2023)    Readmission Risk Interventions     No data to display

## 2023-03-06 NOTE — Care Management Important Message (Signed)
Important Message  Patient Details  Name: Savannah Harris MRN: [id number] Date of Birth: 1936-06-03   Medicare Important Message Given:  Yes     Sherilyn Banker 03/06/2023, 12:07 PM

## 2023-03-12 ENCOUNTER — Other Ambulatory Visit: Payer: Self-pay

## 2023-03-12 ENCOUNTER — Encounter (HOSPITAL_COMMUNITY): Payer: Self-pay | Admitting: Emergency Medicine

## 2023-03-12 ENCOUNTER — Emergency Department (HOSPITAL_COMMUNITY)
Admission: EM | Admit: 2023-03-12 | Discharge: 2023-03-13 | Disposition: A | Discharge status: Home / Self Care | Payer: Medicare Other | Attending: Emergency Medicine | Admitting: Emergency Medicine

## 2023-03-12 ENCOUNTER — Emergency Department (HOSPITAL_COMMUNITY): Payer: Medicare Other

## 2023-03-12 DIAGNOSIS — Z91040 Latex allergy status: Secondary | ICD-10-CM | POA: Insufficient documentation

## 2023-03-12 DIAGNOSIS — E119 Type 2 diabetes mellitus without complications: Secondary | ICD-10-CM | POA: Diagnosis not present

## 2023-03-12 DIAGNOSIS — Z7982 Long term (current) use of aspirin: Secondary | ICD-10-CM | POA: Insufficient documentation

## 2023-03-12 DIAGNOSIS — Z96642 Presence of left artificial hip joint: Secondary | ICD-10-CM | POA: Diagnosis not present

## 2023-03-12 DIAGNOSIS — Z7984 Long term (current) use of oral hypoglycemic drugs: Secondary | ICD-10-CM | POA: Diagnosis not present

## 2023-03-12 DIAGNOSIS — G309 Alzheimer's disease, unspecified: Secondary | ICD-10-CM | POA: Insufficient documentation

## 2023-03-12 DIAGNOSIS — W19XXXA Unspecified fall, initial encounter: Secondary | ICD-10-CM

## 2023-03-12 DIAGNOSIS — M25552 Pain in left hip: Secondary | ICD-10-CM | POA: Insufficient documentation

## 2023-03-12 LAB — BASIC METABOLIC PANEL
Anion gap: 12 (ref 5–15)
BUN: 34 mg/dL — ABNORMAL HIGH (ref 8–23)
CO2: 24 mmol/L (ref 22–32)
Calcium: 8.7 mg/dL — ABNORMAL LOW (ref 8.9–10.3)
Chloride: 101 mmol/L (ref 98–111)
Creatinine, Ser: 0.92 mg/dL (ref 0.44–1.00)
GFR, Estimated: >60 mL/min (ref >60)
Glucose, Bld: 180 mg/dL — ABNORMAL HIGH (ref 70–99)
Potassium: 3.5 mmol/L (ref 3.5–5.1)
Sodium: 137 mmol/L (ref 135–145)

## 2023-03-12 LAB — CBC WITH DIFFERENTIAL/PLATELET
Abs Immature Granulocytes: 0.07 10*3/uL (ref 0.00–0.07)
Basophils Absolute: 0 10*3/uL (ref 0.0–0.1)
Basophils Relative: 0 %
Eosinophils Absolute: 0.2 10*3/uL (ref 0.0–0.5)
Eosinophils Relative: 2 %
HCT: 28.5 % — ABNORMAL LOW (ref 36.0–46.0)
Hemoglobin: 9.1 g/dL — ABNORMAL LOW (ref 12.0–15.0)
Immature Granulocytes: 1 %
Lymphocytes Relative: 14 %
Lymphs Abs: 1 10*3/uL (ref 0.7–4.0)
MCH: 28.3 pg (ref 26.0–34.0)
MCHC: 31.9 g/dL (ref 30.0–36.0)
MCV: 88.8 fL (ref 80.0–100.0)
Monocytes Absolute: 0.6 10*3/uL (ref 0.1–1.0)
Monocytes Relative: 9 %
Neutro Abs: 5.6 10*3/uL (ref 1.7–7.7)
Neutrophils Relative %: 74 %
Platelets: 277 10*3/uL (ref 150–400)
RBC: 3.21 MIL/uL — ABNORMAL LOW (ref 3.87–5.11)
RDW: 13.1 % (ref 11.5–15.5)
WBC: 7.5 10*3/uL (ref 4.0–10.5)
nRBC: 0 % (ref 0.0–0.2)

## 2023-03-12 NOTE — ED Provider Notes (Signed)
[place] EMERGENCY DEPARTMENT AT [facility] Provider Note   CSN: [id number] Arrival date & time: 03/12/23  2215     History  Chief Complaint  Patient presents with   Savannah Harris is a 87 y.o. female with a history of type 2 diabetes mellitus, hyperlipidemia, thoracic vertebral compression fractures who presents to the ED today via EMS after a fall. Patient fell and had a closed fracture to the left hip 1 week ago followed by a hemiarthroplasty the same day. She has been at [facility] since and today she was ambulating to get into bed when the nurse, who was helping her, accidentally bumped into her wheelchair which caused patient to fall onto her bed. Patient fell onto her left hip. She denies hitting her head, LOC, lightheadedness, or dizziness.  Additionally, she reports baby aspirin use. She received Fentanyl x2 by EMS en route to ED.      Home Medications Prior to Admission medications   Medication Sig Start Date End Date Taking? Authorizing Provider  aspirin EC 81 MG tablet Take 1 tablet (81 mg total) by mouth 2 (two) times daily for 28 days, THEN 1 tablet (81 mg total) daily. Swallow whole.. 03/06/23 05/03/23  Narda Bonds, MD  Cholecalciferol (VITAMIN D3) 5000 UNITS CAPS Take 5,000 Units by mouth daily.     [provider]  cyanocobalamin (VITAMIN B12) 1000 MCG tablet Take 1,000 mcg by mouth daily.    [provider]  cycloSPORINE (RESTASIS) 0.05 % ophthalmic emulsion Place 1 drop into both eyes 2 (two) times daily.    [provider]  donepezil (ARICEPT) 10 MG tablet Take 10 mg by mouth at bedtime.    [provider]  feeding supplement (ENSURE ENLIVE / ENSURE PLUS) LIQD Take 237 mLs by mouth 2 (two) times daily between meals. 03/07/23   Narda Bonds, MD  fluticasone (FLONASE) 50 MCG/ACT nasal spray Place 1 spray into both nostrils daily as needed for allergies or rhinitis.    [provider]   metFORMIN (GLUCOPHAGE) 1000 MG tablet Take 1,000 mg by mouth 2 (two) times daily.    [provider]  Omega-3 Fatty Acids (FISH OIL) 1000 MG CAPS Take 1,000 mg by mouth daily.    [provider]  oxyCODONE (OXY IR/ROXICODONE) 5 MG immediate release tablet Take 0.5-1 tablets (2.5-5 mg total) by mouth every 6 (six) hours as needed for severe pain or moderate pain. 03/06/23   Narda Bonds, MD  polyethylene glycol (MIRALAX / GLYCOLAX) 17 g packet Take 17 g by mouth daily. 03/06/23   Narda Bonds, MD  Probiotic Product (ALIGN) 4 MG CAPS Take 4 mg by mouth daily.    [provider]  rosuvastatin (CRESTOR) 10 MG tablet Take 10 mg by mouth daily.    [provider]  sertraline (ZOLOFT) 50 MG tablet Take 50 mg by mouth daily.    [provider]      Allergies    Latex, Sulfamethoxazole-trimethoprim, Adhesive [tape], Canagliflozin, and Codeine    Review of Systems   Review of Systems  Musculoskeletal:        Left hip pain    Physical Exam Updated Vital Signs BP (!) 134/57 (BP Location: Right Arm)   Pulse 61   Temp 97.8 F (36.6 C) (Oral)   Resp 16   Ht 5' (1.524 m)   Wt 56.2 kg   SpO2 98%   BMI 24.22  kg/m  Physical Exam Vitals and nursing note reviewed.  Constitutional:      Appearance: Normal appearance.  HENT:     Head: Normocephalic and atraumatic.     Mouth/Throat:     Mouth: Mucous membranes are moist.  Eyes:     Conjunctiva/sclera: Conjunctivae normal.     Pupils: Pupils are equal, round, and reactive to light.  Cardiovascular:     Rate and Rhythm: Normal rate and regular rhythm.     Pulses: Normal pulses.     Heart sounds: Normal heart sounds.  Pulmonary:     Effort: Pulmonary effort is normal.     Breath sounds: Normal breath sounds.  Abdominal:     Palpations: Abdomen is soft.     Tenderness: There is no abdominal tenderness.  Musculoskeletal:        General: Tenderness present.     Comments: Tenderness to left hip   Skin:    General: Skin is warm and dry.     Capillary Refill: Capillary refill takes less than 2 seconds.     Findings: No rash.     Comments: Bandage on left hip covering incision after surgery 1 week prior  Neurological:     General: No focal deficit present.     Mental Status: She is alert.     Sensory: No sensory deficit.  Psychiatric:        Mood and Affect: Mood normal.        Behavior: Behavior normal.     ED Results / Procedures / Treatments   Labs (all labs ordered are listed, but only abnormal results are displayed) Labs Reviewed  BASIC METABOLIC PANEL - Abnormal; Notable for the following components:      Result Value   Glucose, Bld 180 (*)    BUN 34 (*)    Calcium 8.7 (*)    All other components within normal limits  CBC WITH DIFFERENTIAL/PLATELET - Abnormal; Notable for the following components:   RBC 3.21 (*)    Hemoglobin 9.1 (*)    HCT 28.5 (*)    All other components within normal limits    EKG EKG Interpretation  Date/Time:  Tuesday Mar 12 2023 22:17:17 EDT Ventricular Rate:  61 PR Interval:  150 QRS Duration: 145 QT Interval:  445 QTC Calculation: 449 R Axis:   -32 Text Interpretation: Sinus rhythm Left ventricular hypertrophy Probable anterior infarct, age indeterminate Artifact in lead(s) II III aVR aVL aVF V3 V4 V5 V6 Confirmed by Gilda Crease [phone]) on 03/13/2023 12:24:40 AM  Radiology DG Hip Unilat With Pelvis 2-3 Views Left  Result Date: 03/12/2023 CLINICAL DATA:  Recent fall with history of recent left hip replacement, initial encounter EXAM: DG HIP (WITH OR WITHOUT PELVIS) 2-3V LEFT COMPARISON:  03/03/2023 FINDINGS: Left hip replacement is again identified. No acute fracture or dislocation is noted. Soft tissue changes are noted. InterStim device is noted on the right. IMPRESSION: No acute abnormality noted. Electronically Signed   By: Alcide Clever M.D.   On: 03/12/2023 23:17    Procedures Procedures: not  indicated.   Medications Ordered in ED Medications - No data to display  ED Course/ Medical Decision Making/ A&P                             Medical Decision Making  This patient presents to the ED for concern of left hip pain, this involves an extensive number of treatment options,  and is a complaint that carries with it a high risk of complications and morbidity.  The differential diagnosis includes: left hip dislocation vs fracture vs contusion.   Co morbidities that complicate the patient evaluation  1 week s/p left hip hemiarthroplasty Closed, displaced fracture of base of left femur neck Type 2 diabetes mellitus Hyperlipidemia Alzheimer's disease Sleep apnea Wedge compression fracture of unspecified thoracic vertebrae   Additional history obtained:  Additional history obtained from patient's husband and EMS. External records from outside source obtained and reviewed including [facility] and [facility] records.   Lab Tests:  I ordered and personally interpreted labs - all are within normal limits.   Imaging Studies ordered:  I ordered imaging studies including left hip x-ray  I independently visualized and interpreted imaging which showed no acute abnormality - no fractures or dislocations identified. I agree with the radiologist interpretation   Cardiac Monitoring: / EKG:  The patient was maintained on a cardiac monitor.  I personally viewed and interpreted the cardiac monitored which showed: LVH with a heart rate of 61 bpm.   Problem List / ED Course / Critical interventions / Medication management  Left hip pain after fall, 1 week s/p left hemiarthroplasty Patient was given Fentanyl x2 by EMS for pain. Reevaluation of the patient after these medicines showed that the patient improved I have reviewed the patients home medicines and have made adjustments as needed   Social Determinants of Health:  Housing   Test / Admission  - Considered:  Patient is stable and safe for discharge back to the rehab facility. Patient has a scheduled follow up with her surgeon in 2 days.        Final Clinical Impression(s) / ED Diagnoses Final diagnoses:  Fall, initial encounter    Rx / DC Orders ED Discharge Orders     None         Maxwell Marion, PA-C 03/13/23 0035    Sloan Leiter, DO 03/13/23 [phone]

## 2023-03-12 NOTE — ED Provider Notes (Incomplete)
[place] EMERGENCY DEPARTMENT AT [facility] Provider Note   CSN: [id number] Arrival date & time: 03/12/23  2215     History {Add pertinent medical, surgical, social history, OB history to HPI:1} Chief Complaint  Patient presents with  . Fall    Savannah Harris is a 87 y.o. female with a history of type 2 diabetes mellitus, hyperlipidemia, thoracic vertebral compression fractures who presents to the ED today via EMS after a fall. Patient fell and had a closed fracture to the left hip 1 week ago followed by a hemiarthroplasty the same day. She has been at [facility] since and today she was ambulating to get into bed when the nurse, who was helping her, accidentally bumped into her wheelchair which caused patient to fall onto her bed. Patient fell onto her left hip. She denies hitting her head, LOC, lightheadedness, or dizziness.  Additionally, she reports baby aspirin use. She received Fentanyl x2 by EMS en route to ED.      Home Medications Prior to Admission medications   Medication Sig Start Date End Date Taking? Authorizing Provider  aspirin EC 81 MG tablet Take 1 tablet (81 mg total) by mouth 2 (two) times daily for 28 days, THEN 1 tablet (81 mg total) daily. Swallow whole.. 03/06/23 05/03/23  Narda Bonds, MD  Cholecalciferol (VITAMIN D3) 5000 UNITS CAPS Take 5,000 Units by mouth daily.     [provider]  cyanocobalamin (VITAMIN B12) 1000 MCG tablet Take 1,000 mcg by mouth daily.    [provider]  cycloSPORINE (RESTASIS) 0.05 % ophthalmic emulsion Place 1 drop into both eyes 2 (two) times daily.    [provider]  donepezil (ARICEPT) 10 MG tablet Take 10 mg by mouth at bedtime.    [provider]  feeding supplement (ENSURE ENLIVE / ENSURE PLUS) LIQD Take 237 mLs by mouth 2 (two) times daily between meals. 03/07/23   Narda Bonds, MD  fluticasone (FLONASE) 50 MCG/ACT nasal spray Place 1 spray into both nostrils  daily as needed for allergies or rhinitis.    [provider]  metFORMIN (GLUCOPHAGE) 1000 MG tablet Take 1,000 mg by mouth 2 (two) times daily.    [provider]  Omega-3 Fatty Acids (FISH OIL) 1000 MG CAPS Take 1,000 mg by mouth daily.    [provider]  oxyCODONE (OXY IR/ROXICODONE) 5 MG immediate release tablet Take 0.5-1 tablets (2.5-5 mg total) by mouth every 6 (six) hours as needed for severe pain or moderate pain. 03/06/23   Narda Bonds, MD  polyethylene glycol (MIRALAX / GLYCOLAX) 17 g packet Take 17 g by mouth daily. 03/06/23   Narda Bonds, MD  Probiotic Product (ALIGN) 4 MG CAPS Take 4 mg by mouth daily.    [provider]  rosuvastatin (CRESTOR) 10 MG tablet Take 10 mg by mouth daily.    [provider]  sertraline (ZOLOFT) 50 MG tablet Take 50 mg by mouth daily.    [provider]      Allergies    Latex, Sulfamethoxazole-trimethoprim, Adhesive [tape], Canagliflozin, and Codeine    Review of Systems   Review of Systems  Musculoskeletal:        Left hip pain    Physical Exam Updated Vital Signs BP (!) 134/57 (BP Location: Right Arm)   Pulse 61   Temp 97.8 F (36.6 C) (Oral)   Resp 16   Ht 5' (1.524 m)   Wt 56.2  kg   SpO2 98%   BMI 24.22 kg/m  Physical Exam Vitals and nursing note reviewed.  Constitutional:      Appearance: Normal appearance.  HENT:     Head: Normocephalic and atraumatic.     Mouth/Throat:     Mouth: Mucous membranes are moist.  Eyes:     Conjunctiva/sclera: Conjunctivae normal.     Pupils: Pupils are equal, round, and reactive to light.  Cardiovascular:     Rate and Rhythm: Normal rate and regular rhythm.     Pulses: Normal pulses.     Heart sounds: Normal heart sounds.  Pulmonary:     Effort: Pulmonary effort is normal.     Breath sounds: Normal breath sounds.  Abdominal:     Palpations: Abdomen is soft.     Tenderness: There is no abdominal tenderness.  Musculoskeletal:         General: Tenderness present.     Comments: Tenderness to left hip  Skin:    General: Skin is warm and dry.     Capillary Refill: Capillary refill takes less than 2 seconds.     Findings: No rash.     Comments: Bandage on left hip covering incision after surgery 1 week prior  Neurological:     General: No focal deficit present.     Mental Status: She is alert.     Sensory: No sensory deficit.  Psychiatric:        Mood and Affect: Mood normal.        Behavior: Behavior normal.     ED Results / Procedures / Treatments   Labs (all labs ordered are listed, but only abnormal results are displayed) Labs Reviewed  CBC WITH DIFFERENTIAL/PLATELET - Abnormal; Notable for the following components:      Result Value   RBC 3.21 (*)    Hemoglobin 9.1 (*)    HCT 28.5 (*)    All other components within normal limits  BASIC METABOLIC PANEL    EKG None  Radiology DG Hip Unilat With Pelvis 2-3 Views Left  Result Date: 03/12/2023 CLINICAL DATA:  Recent fall with history of recent left hip replacement, initial encounter EXAM: DG HIP (WITH OR WITHOUT PELVIS) 2-3V LEFT COMPARISON:  03/03/2023 FINDINGS: Left hip replacement is again identified. No acute fracture or dislocation is noted. Soft tissue changes are noted. InterStim device is noted on the right. IMPRESSION: No acute abnormality noted. Electronically Signed   By: Alcide Clever M.D.   On: 03/12/2023 23:17    Procedures Procedures: not indicated. {Document cardiac monitor, telemetry assessment procedure when appropriate:1}  Medications Ordered in ED Medications - No data to display  ED Course/ Medical Decision Making/ A&P   {   Click here for ABCD2, HEART and other calculatorsREFRESH Note before signing :1}                          Medical Decision Making  This patient presents to the ED for concern of left hip pain, this involves an extensive number of treatment options, and is a complaint that carries with it a high risk of  complications and morbidity.  The differential diagnosis includes: left hip dislocation vs fracture vs contusion   Co morbidities that complicate the patient evaluation  1 week s/p left hip hemiarthroplasty Closed, displaced fracture of base of left femur neck Type 2 diabetes mellitus Hyperlipidemia Alzheimer's disease Sleep apnea Wedge compression fracture of unspecified thoracic vertebrae   Additional  history obtained:  Additional history obtained from patient's husband and EMS. External records from outside source obtained and reviewed including [facility] and [facility] records.   Lab Tests:  I ordered and personally interpreted labs - all are within normal limits.   Imaging Studies ordered:  I ordered imaging studies including left hip x-ray  I independently visualized and interpreted imaging which showed no acute abnormality - no fractures or dislocations identified. I agree with the radiologist interpretation   Cardiac Monitoring: / EKG:  The patient was maintained on a cardiac monitor.  I personally viewed and interpreted the cardiac monitored which showed: LVH with a heart rate of 61 bpm.   Consultations Obtained:  I requested consultation with the ***,  and discussed lab and imaging findings as well as pertinent plan - they recommend: ***   Problem List / ED Course / Critical interventions / Medication management  Left hip pain after fall, 1 week s/p left hemiarthroplasty Patient was given Fentanyl x2 by EMS for pain. Reevaluation of the patient after these medicines showed that the patient improved I have reviewed the patients home medicines and have made adjustments as needed   Social Determinants of Health:  Housing   Test / Admission - Considered:  ***   {Document critical care time when appropriate:1} {Document review of labs and clinical decision tools ie heart score, Chads2Vasc2 etc:1}  {Document your independent  review of radiology images, and any outside records:1} {Document your discussion with family members, caretakers, and with consultants:1} {Document social determinants of health affecting pt's care:1} {Document your decision making why or why not admission, treatments were needed:1} Final Clinical Impression(s) / ED Diagnoses Final diagnoses:  None    Rx / DC Orders ED Discharge Orders     None

## 2023-03-12 NOTE — ED Triage Notes (Signed)
Pt to ED via [facility] EMS from [facility] c/o fall tonight while transferring with aid from wheelchair to bed.  Pt lost balance and left leg fell into side of bed.  Pt had left femur surgery on 5/19.  Pt has pain at site, bandage in place, no obvious deformities or bleeding.  Pt A&Ox4.  EMS gave fentanyl x2 (last at 2203), vitals 140/86 BP, 14 RR, 64 HR, CBG 244, 92% RA and placed on 2L [place] at 98% (pt on RA baseline).

## 2023-03-13 MED ORDER — ACETAMINOPHEN 325 MG PO TABS
650.0000 mg | ORAL_TABLET | ORAL | Status: AC
  Administered 2023-03-13: 650 mg via ORAL
  Filled 2023-03-13: qty 2

## 2023-03-13 NOTE — ED Notes (Signed)
[facility] called for transport to [facility]

## 2023-03-13 NOTE — Discharge Instructions (Signed)
No new injuries seen on hip x-ray after today's fall. Follow up with your orthopedic doctor on Friday, as previously scheduled. Continue Oxycodone as needed for pain as ordered by Dr. Caleb Popp.   Return to ED if: you develop worsening pain to your hip, numbness, tingling, or weakness to your left leg. Or if you develop fever, warmth to touch, or drainage from your incision site.

## 2023-04-04 ENCOUNTER — Other Ambulatory Visit (HOSPITAL_COMMUNITY): Payer: Self-pay | Admitting: *Deleted

## 2023-04-08 ENCOUNTER — Ambulatory Visit (HOSPITAL_COMMUNITY)
Admission: RE | Admit: 2023-04-08 | Discharge: 2023-04-08 | Disposition: A | Discharge status: Home / Self Care | Payer: Medicare Other | Source: Ambulatory Visit | Attending: Internal Medicine | Admitting: Internal Medicine

## 2023-04-08 DIAGNOSIS — M810 Age-related osteoporosis without current pathological fracture: Secondary | ICD-10-CM | POA: Diagnosis present

## 2023-04-08 MED ORDER — DENOSUMAB 60 MG/ML ~~LOC~~ SOSY
PREFILLED_SYRINGE | SUBCUTANEOUS | Status: AC
  Filled 2023-04-08: qty 1

## 2023-04-08 MED ORDER — DENOSUMAB 60 MG/ML ~~LOC~~ SOSY
60.0000 mg | PREFILLED_SYRINGE | Freq: Once | SUBCUTANEOUS | Status: AC
  Administered 2023-04-08: 60 mg via SUBCUTANEOUS

## 2023-04-24 ENCOUNTER — Other Ambulatory Visit: Payer: Self-pay | Admitting: Orthopedic Surgery

## 2023-04-24 DIAGNOSIS — G8929 Other chronic pain: Secondary | ICD-10-CM

## 2023-04-26 ENCOUNTER — Ambulatory Visit
Admission: RE | Admit: 2023-04-26 | Discharge: 2023-04-26 | Disposition: A | Discharge status: Home / Self Care | Payer: Medicare Other | Source: Ambulatory Visit | Attending: Orthopedic Surgery | Admitting: Orthopedic Surgery

## 2023-04-26 DIAGNOSIS — M545 Low back pain, unspecified: Secondary | ICD-10-CM

## 2023-08-02 ENCOUNTER — Other Ambulatory Visit: Payer: Medicare Other

## 2023-08-19 ENCOUNTER — Ambulatory Visit
Admission: RE | Admit: 2023-08-19 | Discharge: 2023-08-19 | Disposition: A | Discharge status: Home / Self Care | Payer: Medicare Other | Source: Ambulatory Visit | Attending: Internal Medicine | Admitting: Internal Medicine

## 2023-08-19 DIAGNOSIS — S32010A Wedge compression fracture of first lumbar vertebra, initial encounter for closed fracture: Secondary | ICD-10-CM

## 2023-08-19 DIAGNOSIS — M810 Age-related osteoporosis without current pathological fracture: Secondary | ICD-10-CM

## 2023-11-07 ENCOUNTER — Other Ambulatory Visit: Payer: Self-pay | Admitting: Internal Medicine

## 2023-11-07 DIAGNOSIS — Z1231 Encounter for screening mammogram for malignant neoplasm of breast: Secondary | ICD-10-CM

## 2023-12-20 ENCOUNTER — Ambulatory Visit
Admission: RE | Admit: 2023-12-20 | Discharge: 2023-12-20 | Disposition: A | Discharge status: Home / Self Care | Payer: Medicare Other | Source: Ambulatory Visit | Attending: Internal Medicine | Admitting: Internal Medicine

## 2023-12-20 DIAGNOSIS — Z1231 Encounter for screening mammogram for malignant neoplasm of breast: Secondary | ICD-10-CM

## 2023-12-26 ENCOUNTER — Other Ambulatory Visit: Payer: Self-pay | Admitting: Internal Medicine

## 2023-12-26 DIAGNOSIS — R928 Other abnormal and inconclusive findings on diagnostic imaging of breast: Secondary | ICD-10-CM

## 2024-01-06 ENCOUNTER — Ambulatory Visit: Admission: RE | Admit: 2024-01-06 | Source: Ambulatory Visit

## 2024-01-06 ENCOUNTER — Ambulatory Visit
Admission: RE | Admit: 2024-01-06 | Discharge: 2024-01-06 | Disposition: A | Discharge status: Home / Self Care | Source: Ambulatory Visit | Attending: Internal Medicine | Admitting: Internal Medicine

## 2024-01-06 DIAGNOSIS — R928 Other abnormal and inconclusive findings on diagnostic imaging of breast: Secondary | ICD-10-CM

## 2024-03-11 ENCOUNTER — Emergency Department (HOSPITAL_BASED_OUTPATIENT_CLINIC_OR_DEPARTMENT_OTHER)

## 2024-03-11 ENCOUNTER — Other Ambulatory Visit: Payer: Self-pay

## 2024-03-11 ENCOUNTER — Encounter (HOSPITAL_BASED_OUTPATIENT_CLINIC_OR_DEPARTMENT_OTHER): Payer: Self-pay | Admitting: Emergency Medicine

## 2024-03-11 ENCOUNTER — Emergency Department (HOSPITAL_BASED_OUTPATIENT_CLINIC_OR_DEPARTMENT_OTHER)
Admission: EM | Admit: 2024-03-11 | Discharge: 2024-03-11 | Disposition: A | Discharge status: Home / Self Care | Attending: Emergency Medicine | Admitting: Emergency Medicine

## 2024-03-11 DIAGNOSIS — R519 Headache, unspecified: Secondary | ICD-10-CM | POA: Diagnosis present

## 2024-03-11 DIAGNOSIS — Z91040 Latex allergy status: Secondary | ICD-10-CM | POA: Insufficient documentation

## 2024-03-11 MED ORDER — ACETAMINOPHEN 500 MG PO TABS
1000.0000 mg | ORAL_TABLET | Freq: Once | ORAL | Status: AC
  Administered 2024-03-11: 1000 mg via ORAL
  Filled 2024-03-11: qty 2

## 2024-03-11 MED ORDER — PROCHLORPERAZINE EDISYLATE 10 MG/2ML IJ SOLN: 5.0000 mg | Freq: Once | INTRAMUSCULAR | Status: DC

## 2024-03-11 MED ORDER — DIPHENHYDRAMINE HCL 50 MG/ML IJ SOLN: 12.5000 mg | Freq: Once | INTRAMUSCULAR | Status: DC

## 2024-03-11 NOTE — ED Provider Notes (Signed)
 [place] [facility] AT [facility] Provider Note   CSN: [id number] Arrival date & time: 03/11/24  1814     History  Chief Complaint  Patient presents with   Headache    Savannah Harris is a 88 y.o. female.  Patient here with 4 days of migraines.  Takes Tylenol  for it usually goes away.  Has been lasting longer than normal.  Sensitivity to light.  History of migraines.  Headache came on gradually.  Starts at the base of her neck and goes up the back of her head.  Savannah Harris has some nausea.  None today.  History of memory issues.  History of high cholesterol diabetes.  Denies any weakness numbness tingling.  Patient mostly concerned because headaches been going on for a long time.  Savannah Harris want to make sure nothing was going on.  Savannah Harris denies any stroke symptoms.  No vision changes no vision loss.  Denies any traumas.  Does have frequent falls because Savannah Harris has ambulation problems at baseline.  The history is provided by the patient.       Home Medications Prior to Admission medications   Medication Sig Start Date End Date Taking? Authorizing Provider  Cholecalciferol (VITAMIN D3) 5000 UNITS CAPS Take 5,000 Units by mouth daily.     [provider]  cyanocobalamin (VITAMIN B12) 1000 MCG tablet Take 1,000 mcg by mouth daily.    [provider]  cycloSPORINE  (RESTASIS ) 0.05 % ophthalmic emulsion Place 1 drop into both eyes 2 (two) times daily.    [provider]  donepezil  (ARICEPT ) 10 MG tablet Take 10 mg by mouth at bedtime.    [provider]  feeding supplement (ENSURE ENLIVE / ENSURE PLUS) LIQD Take 237 mLs by mouth 2 (two) times daily between meals. 03/07/23   Verlyn Goad, MD  fluticasone (FLONASE) 50 MCG/ACT nasal spray Place 1 spray into both nostrils daily as needed for allergies or rhinitis.    [provider]  metFORMIN (GLUCOPHAGE) 1000 MG tablet Take 1,000 mg by mouth 2 (two) times daily.    [provider]   Omega-3 Fatty Acids (FISH OIL) 1000 MG CAPS Take 1,000 mg by mouth daily.    [provider]  oxyCODONE  (OXY IR/ROXICODONE ) 5 MG immediate release tablet Take 0.5-1 tablets (2.5-5 mg total) by mouth every 6 (six) hours as needed for severe pain or moderate pain. 03/06/23   Verlyn Goad, MD  polyethylene glycol (MIRALAX  / GLYCOLAX ) 17 g packet Take 17 g by mouth daily. 03/06/23   Verlyn Goad, MD  Probiotic Product (ALIGN) 4 MG CAPS Take 4 mg by mouth daily.    [provider]  rosuvastatin  (CRESTOR ) 10 MG tablet Take 10 mg by mouth daily.    [provider]  sertraline  (ZOLOFT ) 50 MG tablet Take 50 mg by mouth daily.    [provider]      Allergies    Latex, Sulfamethoxazole -trimethoprim , Adhesive [tape], Canagliflozin, and Codeine    Review of Systems   Review of Systems  Physical Exam Updated Vital Signs BP (!) 162/83 (BP Location: Right Arm)   Pulse 71   Temp 97.9 F (36.6 C) (Oral)   Resp 16   Ht 5' (1.524 m)   Wt 56.2 kg   SpO2 99%   BMI 24.20 kg/m  Physical Exam Vitals and nursing note reviewed.  Constitutional:      General: Savannah Harris is not in acute distress.    Appearance: Savannah Harris is  well-developed.  HENT:     Head: Normocephalic and atraumatic.     Mouth/Throat:     Mouth: Mucous membranes are moist.  Eyes:     General: No visual field deficit.    Extraocular Movements: Extraocular movements intact.     Conjunctiva/sclera: Conjunctivae normal.     Pupils: Pupils are equal, round, and reactive to light.  Cardiovascular:     Rate and Rhythm: Normal rate and regular rhythm.     Heart sounds: Normal heart sounds. No murmur heard. Pulmonary:     Effort: Pulmonary effort is normal. No respiratory distress.     Breath sounds: Normal breath sounds.  Abdominal:     Palpations: Abdomen is soft.     Tenderness: There is no abdominal tenderness.  Musculoskeletal:        General: No swelling. Normal range of motion.     Cervical back:  Normal range of motion and neck supple.     Comments: Tenderness to the paraspinal cervical muscles  Skin:    General: Skin is warm and dry.     Capillary Refill: Capillary refill takes less than 2 seconds.  Neurological:     Mental Status: Savannah Harris is alert and oriented to person, place, and time.     Cranial Nerves: No cranial nerve deficit, dysarthria or facial asymmetry.     Sensory: No sensory deficit.     Motor: No weakness.     Coordination: Coordination normal.     Gait: Gait normal.     Comments: 5+ out of 5 strength, normal sensation, normal coordination, normal visual fields, normal speech, normal gait with her walker  Psychiatric:        Mood and Affect: Mood normal.     ED Results / Procedures / Treatments   Labs (all labs ordered are listed, but only abnormal results are displayed) Labs Reviewed - No data to display  EKG None  Radiology CT Head Wo Contrast Result Date: 03/11/2024 CLINICAL DATA:  Headache photosensitivity EXAM: CT HEAD WITHOUT CONTRAST CT CERVICAL SPINE WITHOUT CONTRAST TECHNIQUE: Multidetector CT imaging of the head and cervical spine was performed following the standard protocol without intravenous contrast. Multiplanar CT image reconstructions of the cervical spine were also generated. RADIATION DOSE REDUCTION: This exam was performed according to the departmental dose-optimization program which includes automated exposure control, adjustment of the mA and/or kV according to patient size and/or use of iterative reconstruction technique. COMPARISON:  CT brain and cervical spine 03/03/2023 FINDINGS: CT HEAD FINDINGS Brain: No acute territorial infarction, hemorrhage or intracranial mass. Atrophy and chronic small vessel ischemic changes of the white matter. 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 Stable ventricle size. Vascular: No hyperdense vessels.  No unexpected calcification Skull: Normal. Negative  for fracture or focal lesion. Sinuses/Orbits: No acute finding. Other: None CT CERVICAL SPINE FINDINGS Alignment: Stable trace anterolisthesis C4 on C5, C5 on C6. Facet alignment is within normal limits. Skull base and vertebrae: No fracture. Stable sclerotic focus at C7. Soft tissues and spinal canal: No prevertebral fluid or swelling. No visible canal hematoma. Disc levels: Multilevel degenerative changes. Moderate disc space narrowing C5-C6 and C6-C7. Multilevel facet degenerative changes and multilevel foraminal narrowing. Upper chest: Lung apices are clear Other: None IMPRESSION: 1. No CT evidence for acute intracranial abnormality. Atrophy and chronic small vessel ischemic changes of the white matter. Small chronic infarcts. 2. Degenerative changes of the cervical spine. No acute osseous abnormality. Electronically Signed  By: Esmeralda Hedge M.D.   On: 03/11/2024 19:24   CT Cervical Spine Wo Contrast Result Date: 03/11/2024 CLINICAL DATA:  Headache photosensitivity EXAM: CT HEAD WITHOUT CONTRAST CT CERVICAL SPINE WITHOUT CONTRAST TECHNIQUE: Multidetector CT imaging of the head and cervical spine was performed following the standard protocol without intravenous contrast. Multiplanar CT image reconstructions of the cervical spine were also generated. RADIATION DOSE REDUCTION: This exam was performed according to the departmental dose-optimization program which includes automated exposure control, adjustment of the mA and/or kV according to patient size and/or use of iterative reconstruction technique. COMPARISON:  CT brain and cervical spine 03/03/2023 FINDINGS: CT HEAD FINDINGS Brain: No acute territorial infarction, hemorrhage or intracranial mass. Atrophy and chronic small vessel ischemic changes of the white matter. 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 Stable ventricle size. Vascular: No hyperdense vessels.  No unexpected calcification Skull:  Normal. Negative for fracture or focal lesion. Sinuses/Orbits: No acute finding. Other: None CT CERVICAL SPINE FINDINGS Alignment: Stable trace anterolisthesis C4 on C5, C5 on C6. Facet alignment is within normal limits. Skull base and vertebrae: No fracture. Stable sclerotic focus at C7. Soft tissues and spinal canal: No prevertebral fluid or swelling. No visible canal hematoma. Disc levels: Multilevel degenerative changes. Moderate disc space narrowing C5-C6 and C6-C7. Multilevel facet degenerative changes and multilevel foraminal narrowing. Upper chest: Lung apices are clear Other: None IMPRESSION: 1. No CT evidence for acute intracranial abnormality. Atrophy and chronic small vessel ischemic changes of the white matter. Small chronic infarcts. 2. Degenerative changes of the cervical spine. No acute osseous abnormality. Electronically Signed   By: Esmeralda Hedge M.D.   On: 03/11/2024 19:24    Procedures Procedures    Medications Ordered in ED Medications  acetaminophen  (TYLENOL ) tablet 1,000 mg (1,000 mg Oral Given 03/11/24 1923)    ED Course/ Medical Decision Making/ A&P                                 Medical Decision Making Amount and/or Complexity of Data Reviewed Radiology: ordered.  Risk OTC drugs. Prescription drug management.   CAMAYA GANNETT is here with headache.  History of migraines.  Tylenol  has helped some at home.  But headaches lasting longer than it normally does.  Pain mostly in the base of her neck goes up to the back of her head.  Savannah Harris denies any fever or chills.  No recent falls.  History of diabetes hypertension.  Overall Savannah Harris is neurologically intact.  Differential diagnosis likely migraine may be muscular process.  Seems less likely to be head bleed.  I no concern for stroke.  But given that this is atypical for headaches to last this long we will get a head CT and neck CT.  Savannah Harris is tender in the paraspinal cervical muscles.  I do wonder if this is more of a tension  process or musculoskeletal process.  Savannah Harris is ambulatory with her walker at baseline.  Savannah Harris is got normal strength and sensation throughout.  Normal visual fields normal speech.  We talked about pain medication here.  Savannah Harris has not had any Tylenol  since this morning.  Savannah Harris does have some narcotic pain medicine Savannah Harris can take at home.  Savannah Harris did not want to do anything sedating so overall given that Savannah Harris is pretty comfortable we will give her a dose of Tylenol  here.  Will get the CT  scans and anticipate discharge if scans are unremarkable.  CT scan of the head and neck are unremarkable.  Recommend continue use of Tylenol  and pain medicine that Savannah Harris has at home.  Told to return if symptoms worsen.  Follow-up with primary care doctor.  This chart was dictated using voice recognition software.  Despite best efforts to proofread,  errors can occur which can change the documentation meaning.         Final Clinical Impression(s) / ED Diagnoses Final diagnoses:  Nonintractable headache, unspecified chronicity pattern, unspecified headache type    Rx / DC Orders ED Discharge Orders     None         Lowery Rue, DO 03/11/24 1931

## 2024-03-11 NOTE — ED Notes (Signed)
 Reviewed D/C information with the patient, pt verbalized understanding. No additional concerns at this time.

## 2024-03-11 NOTE — ED Triage Notes (Signed)
 Pt via pov from home with headache x 4 days. She states it originates in her neck and goes up the back of her head. She states she has had pain like this in the past, but never for 4 days. Endorses photosensitivity, has had some nausea, but not today. Pt alert & oriented, nad noted.
